# Patient Record
Sex: Male | Born: 1935
Health system: Southern US, Community
[De-identification: ages and names within clinical notes are randomized; demographics above are authoritative.]

## PROBLEM LIST (undated history)

## (undated) DIAGNOSIS — Z8739 Personal history of other diseases of the musculoskeletal system and connective tissue: Secondary | ICD-10-CM

## (undated) DIAGNOSIS — J449 Chronic obstructive pulmonary disease, unspecified: Secondary | ICD-10-CM

## (undated) DIAGNOSIS — F329 Major depressive disorder, single episode, unspecified: Secondary | ICD-10-CM

## (undated) DIAGNOSIS — Z951 Presence of aortocoronary bypass graft: Secondary | ICD-10-CM

## (undated) DIAGNOSIS — Z720 Tobacco use: Secondary | ICD-10-CM

## (undated) DIAGNOSIS — M1990 Unspecified osteoarthritis, unspecified site: Secondary | ICD-10-CM

## (undated) DIAGNOSIS — N400 Enlarged prostate without lower urinary tract symptoms: Secondary | ICD-10-CM

## (undated) DIAGNOSIS — K649 Unspecified hemorrhoids: Secondary | ICD-10-CM

## (undated) DIAGNOSIS — F32A Depression, unspecified: Secondary | ICD-10-CM

## (undated) DIAGNOSIS — I480 Paroxysmal atrial fibrillation: Secondary | ICD-10-CM

## (undated) DIAGNOSIS — F419 Anxiety disorder, unspecified: Secondary | ICD-10-CM

## (undated) DIAGNOSIS — E785 Hyperlipidemia, unspecified: Secondary | ICD-10-CM

## (undated) DIAGNOSIS — K219 Gastro-esophageal reflux disease without esophagitis: Secondary | ICD-10-CM

## (undated) DIAGNOSIS — I2510 Atherosclerotic heart disease of native coronary artery without angina pectoris: Secondary | ICD-10-CM

## (undated) DIAGNOSIS — K222 Esophageal obstruction: Secondary | ICD-10-CM

## (undated) DIAGNOSIS — M50021 Cervical disc disorder at C4-C5 level with myelopathy: Secondary | ICD-10-CM

## (undated) DIAGNOSIS — I252 Old myocardial infarction: Secondary | ICD-10-CM

## (undated) DIAGNOSIS — I10 Essential (primary) hypertension: Secondary | ICD-10-CM

## (undated) DIAGNOSIS — M24561 Contracture, right knee: Secondary | ICD-10-CM

## (undated) DIAGNOSIS — Z01818 Encounter for other preprocedural examination: Secondary | ICD-10-CM

## (undated) DIAGNOSIS — R269 Unspecified abnormalities of gait and mobility: Secondary | ICD-10-CM

## (undated) HISTORY — DX: Anxiety disorder, unspecified: F41.9

## (undated) HISTORY — PX: CARDIAC CATHETERIZATION: SHX172

## (undated) HISTORY — DX: Esophageal obstruction: K22.2

## (undated) HISTORY — DX: Paroxysmal atrial fibrillation: I48.0

## (undated) HISTORY — DX: Chronic obstructive pulmonary disease, unspecified: J44.9

## (undated) HISTORY — DX: Essential (primary) hypertension: I10

## (undated) HISTORY — DX: Atherosclerotic heart disease of native coronary artery without angina pectoris: I25.10

## (undated) HISTORY — DX: Unspecified hemorrhoids: K64.9

## (undated) HISTORY — DX: Gastro-esophageal reflux disease without esophagitis: K21.9

## (undated) HISTORY — DX: Depression, unspecified: F32.A

## (undated) HISTORY — DX: Personal history of other diseases of the musculoskeletal system and connective tissue: Z87.39

## (undated) HISTORY — DX: Unspecified abnormalities of gait and mobility: R26.9

## (undated) HISTORY — DX: Tobacco use: Z72.0

## (undated) HISTORY — DX: Presence of aortocoronary bypass graft: Z95.1

## (undated) HISTORY — DX: Encounter for other preprocedural examination: Z01.818

## (undated) HISTORY — DX: Old myocardial infarction: I25.2

## (undated) HISTORY — DX: Benign prostatic hyperplasia without lower urinary tract symptoms: N40.0

## (undated) HISTORY — DX: Cervical disc disorder at C4-C5 level with myelopathy: M50.021

## (undated) HISTORY — DX: Unspecified osteoarthritis, unspecified site: M19.90

## (undated) HISTORY — DX: Hyperlipidemia, unspecified: E78.5

## (undated) HISTORY — DX: Contracture, right knee: M24.561

## (undated) HISTORY — PX: CORONARY ARTERY BYPASS GRAFT: SHX141

---
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
## 1994-08-04 HISTORY — PX: HEMORRHOID SURGERY: SHX153

## 2005-11-06 HISTORY — PX: ESOPHAGOGASTRODUODENOSCOPY: SHX1529

## 2011-12-08 DIAGNOSIS — I2510 Atherosclerotic heart disease of native coronary artery without angina pectoris: Secondary | ICD-10-CM

## 2012-03-22 DIAGNOSIS — J449 Chronic obstructive pulmonary disease, unspecified: Secondary | ICD-10-CM | POA: Insufficient documentation

## 2012-03-22 DIAGNOSIS — I2510 Atherosclerotic heart disease of native coronary artery without angina pectoris: Secondary | ICD-10-CM

## 2012-03-22 DIAGNOSIS — Z720 Tobacco use: Secondary | ICD-10-CM | POA: Insufficient documentation

## 2012-03-22 DIAGNOSIS — K219 Gastro-esophageal reflux disease without esophagitis: Secondary | ICD-10-CM

## 2012-03-22 DIAGNOSIS — I252 Old myocardial infarction: Secondary | ICD-10-CM

## 2012-03-22 DIAGNOSIS — I10 Essential (primary) hypertension: Secondary | ICD-10-CM

## 2012-03-22 HISTORY — DX: Old myocardial infarction: I25.2

## 2012-03-22 HISTORY — DX: Gastro-esophageal reflux disease without esophagitis: K21.9

## 2012-03-22 HISTORY — DX: Chronic obstructive pulmonary disease, unspecified: J44.9

## 2012-03-22 HISTORY — DX: Tobacco use: Z72.0

## 2012-03-22 HISTORY — DX: Essential (primary) hypertension: I10

## 2012-03-22 HISTORY — DX: Atherosclerotic heart disease of native coronary artery without angina pectoris: I25.10

## 2012-03-25 DIAGNOSIS — Z951 Presence of aortocoronary bypass graft: Secondary | ICD-10-CM

## 2012-03-25 HISTORY — DX: Presence of aortocoronary bypass graft: Z95.1

## 2015-03-05 DIAGNOSIS — I10 Essential (primary) hypertension: Secondary | ICD-10-CM | POA: Diagnosis not present

## 2015-03-05 DIAGNOSIS — I4891 Unspecified atrial fibrillation: Secondary | ICD-10-CM | POA: Diagnosis not present

## 2015-03-05 DIAGNOSIS — M150 Primary generalized (osteo)arthritis: Secondary | ICD-10-CM | POA: Diagnosis not present

## 2015-03-26 DIAGNOSIS — I2510 Atherosclerotic heart disease of native coronary artery without angina pectoris: Secondary | ICD-10-CM | POA: Diagnosis not present

## 2015-03-26 DIAGNOSIS — E785 Hyperlipidemia, unspecified: Secondary | ICD-10-CM

## 2015-03-26 DIAGNOSIS — I480 Paroxysmal atrial fibrillation: Secondary | ICD-10-CM

## 2015-03-26 HISTORY — DX: Hyperlipidemia, unspecified: E78.5

## 2015-03-26 HISTORY — DX: Paroxysmal atrial fibrillation: I48.0

## 2015-04-10 DIAGNOSIS — M170 Bilateral primary osteoarthritis of knee: Secondary | ICD-10-CM | POA: Diagnosis not present

## 2015-04-10 DIAGNOSIS — M25562 Pain in left knee: Secondary | ICD-10-CM | POA: Diagnosis not present

## 2015-04-10 DIAGNOSIS — M25561 Pain in right knee: Secondary | ICD-10-CM | POA: Diagnosis not present

## 2015-04-10 DIAGNOSIS — S0181XA Laceration without foreign body of other part of head, initial encounter: Secondary | ICD-10-CM | POA: Diagnosis not present

## 2015-04-10 DIAGNOSIS — M25461 Effusion, right knee: Secondary | ICD-10-CM | POA: Diagnosis not present

## 2015-04-16 DIAGNOSIS — M1711 Unilateral primary osteoarthritis, right knee: Secondary | ICD-10-CM | POA: Diagnosis not present

## 2015-04-19 DIAGNOSIS — K265 Chronic or unspecified duodenal ulcer with perforation: Secondary | ICD-10-CM | POA: Diagnosis not present

## 2015-05-07 DIAGNOSIS — K5730 Diverticulosis of large intestine without perforation or abscess without bleeding: Secondary | ICD-10-CM | POA: Diagnosis not present

## 2015-05-07 DIAGNOSIS — K648 Other hemorrhoids: Secondary | ICD-10-CM | POA: Diagnosis not present

## 2015-05-07 DIAGNOSIS — K219 Gastro-esophageal reflux disease without esophagitis: Secondary | ICD-10-CM | POA: Diagnosis not present

## 2015-05-07 DIAGNOSIS — K649 Unspecified hemorrhoids: Secondary | ICD-10-CM | POA: Diagnosis not present

## 2015-05-07 DIAGNOSIS — I10 Essential (primary) hypertension: Secondary | ICD-10-CM | POA: Diagnosis not present

## 2015-05-07 DIAGNOSIS — I2510 Atherosclerotic heart disease of native coronary artery without angina pectoris: Secondary | ICD-10-CM | POA: Diagnosis not present

## 2015-05-07 DIAGNOSIS — J449 Chronic obstructive pulmonary disease, unspecified: Secondary | ICD-10-CM | POA: Diagnosis not present

## 2015-05-07 DIAGNOSIS — D120 Benign neoplasm of cecum: Secondary | ICD-10-CM | POA: Diagnosis not present

## 2015-05-07 DIAGNOSIS — M1990 Unspecified osteoarthritis, unspecified site: Secondary | ICD-10-CM | POA: Diagnosis not present

## 2015-05-07 DIAGNOSIS — K625 Hemorrhage of anus and rectum: Secondary | ICD-10-CM | POA: Diagnosis not present

## 2015-05-07 DIAGNOSIS — D122 Benign neoplasm of ascending colon: Secondary | ICD-10-CM | POA: Diagnosis not present

## 2015-05-07 HISTORY — PX: COLONOSCOPY: SHX174

## 2015-05-28 DIAGNOSIS — M1711 Unilateral primary osteoarthritis, right knee: Secondary | ICD-10-CM | POA: Diagnosis not present

## 2015-05-30 DIAGNOSIS — M25561 Pain in right knee: Secondary | ICD-10-CM | POA: Diagnosis not present

## 2015-05-30 DIAGNOSIS — R262 Difficulty in walking, not elsewhere classified: Secondary | ICD-10-CM | POA: Diagnosis not present

## 2015-05-30 DIAGNOSIS — M25562 Pain in left knee: Secondary | ICD-10-CM | POA: Diagnosis not present

## 2015-06-04 DIAGNOSIS — M25562 Pain in left knee: Secondary | ICD-10-CM | POA: Diagnosis not present

## 2015-06-04 DIAGNOSIS — R262 Difficulty in walking, not elsewhere classified: Secondary | ICD-10-CM | POA: Diagnosis not present

## 2015-06-04 DIAGNOSIS — M25561 Pain in right knee: Secondary | ICD-10-CM | POA: Diagnosis not present

## 2015-06-06 DIAGNOSIS — R262 Difficulty in walking, not elsewhere classified: Secondary | ICD-10-CM | POA: Diagnosis not present

## 2015-06-06 DIAGNOSIS — I2510 Atherosclerotic heart disease of native coronary artery without angina pectoris: Secondary | ICD-10-CM | POA: Diagnosis not present

## 2015-06-06 DIAGNOSIS — I10 Essential (primary) hypertension: Secondary | ICD-10-CM | POA: Diagnosis not present

## 2015-06-06 DIAGNOSIS — W19XXXD Unspecified fall, subsequent encounter: Secondary | ICD-10-CM | POA: Diagnosis not present

## 2015-06-06 DIAGNOSIS — Z23 Encounter for immunization: Secondary | ICD-10-CM | POA: Diagnosis not present

## 2015-06-06 DIAGNOSIS — I480 Paroxysmal atrial fibrillation: Secondary | ICD-10-CM | POA: Diagnosis not present

## 2015-06-06 DIAGNOSIS — E7800 Pure hypercholesterolemia, unspecified: Secondary | ICD-10-CM | POA: Diagnosis not present

## 2015-06-06 DIAGNOSIS — Z0000 Encounter for general adult medical examination without abnormal findings: Secondary | ICD-10-CM | POA: Diagnosis not present

## 2015-06-06 DIAGNOSIS — Z1389 Encounter for screening for other disorder: Secondary | ICD-10-CM | POA: Diagnosis not present

## 2015-06-06 DIAGNOSIS — K219 Gastro-esophageal reflux disease without esophagitis: Secondary | ICD-10-CM | POA: Diagnosis not present

## 2015-06-06 DIAGNOSIS — N400 Enlarged prostate without lower urinary tract symptoms: Secondary | ICD-10-CM | POA: Diagnosis not present

## 2015-06-07 DIAGNOSIS — M25561 Pain in right knee: Secondary | ICD-10-CM | POA: Diagnosis not present

## 2015-06-07 DIAGNOSIS — R262 Difficulty in walking, not elsewhere classified: Secondary | ICD-10-CM | POA: Diagnosis not present

## 2015-06-07 DIAGNOSIS — M25562 Pain in left knee: Secondary | ICD-10-CM | POA: Diagnosis not present

## 2015-06-12 DIAGNOSIS — R262 Difficulty in walking, not elsewhere classified: Secondary | ICD-10-CM | POA: Diagnosis not present

## 2015-06-12 DIAGNOSIS — M25561 Pain in right knee: Secondary | ICD-10-CM | POA: Diagnosis not present

## 2015-06-12 DIAGNOSIS — M25562 Pain in left knee: Secondary | ICD-10-CM | POA: Diagnosis not present

## 2015-06-18 DIAGNOSIS — M1711 Unilateral primary osteoarthritis, right knee: Secondary | ICD-10-CM | POA: Diagnosis not present

## 2015-06-19 DIAGNOSIS — R262 Difficulty in walking, not elsewhere classified: Secondary | ICD-10-CM | POA: Diagnosis not present

## 2015-06-19 DIAGNOSIS — M25561 Pain in right knee: Secondary | ICD-10-CM | POA: Diagnosis not present

## 2015-06-19 DIAGNOSIS — M25562 Pain in left knee: Secondary | ICD-10-CM | POA: Diagnosis not present

## 2015-06-21 DIAGNOSIS — R262 Difficulty in walking, not elsewhere classified: Secondary | ICD-10-CM | POA: Diagnosis not present

## 2015-06-21 DIAGNOSIS — M720 Palmar fascial fibromatosis [Dupuytren]: Secondary | ICD-10-CM | POA: Diagnosis not present

## 2015-06-21 DIAGNOSIS — M25562 Pain in left knee: Secondary | ICD-10-CM | POA: Diagnosis not present

## 2015-06-21 DIAGNOSIS — M25561 Pain in right knee: Secondary | ICD-10-CM | POA: Diagnosis not present

## 2015-06-24 DIAGNOSIS — M25552 Pain in left hip: Secondary | ICD-10-CM | POA: Diagnosis not present

## 2015-06-24 DIAGNOSIS — M1612 Unilateral primary osteoarthritis, left hip: Secondary | ICD-10-CM | POA: Diagnosis not present

## 2015-06-24 DIAGNOSIS — M1610 Unilateral primary osteoarthritis, unspecified hip: Secondary | ICD-10-CM | POA: Diagnosis not present

## 2015-06-27 DIAGNOSIS — I480 Paroxysmal atrial fibrillation: Secondary | ICD-10-CM | POA: Diagnosis not present

## 2015-06-27 DIAGNOSIS — I2510 Atherosclerotic heart disease of native coronary artery without angina pectoris: Secondary | ICD-10-CM | POA: Diagnosis not present

## 2015-06-27 DIAGNOSIS — E785 Hyperlipidemia, unspecified: Secondary | ICD-10-CM | POA: Diagnosis not present

## 2015-07-03 DIAGNOSIS — M25561 Pain in right knee: Secondary | ICD-10-CM | POA: Diagnosis not present

## 2015-07-03 DIAGNOSIS — M25562 Pain in left knee: Secondary | ICD-10-CM | POA: Diagnosis not present

## 2015-07-03 DIAGNOSIS — R262 Difficulty in walking, not elsewhere classified: Secondary | ICD-10-CM | POA: Diagnosis not present

## 2015-07-05 DIAGNOSIS — M25562 Pain in left knee: Secondary | ICD-10-CM | POA: Diagnosis not present

## 2015-07-05 DIAGNOSIS — M25561 Pain in right knee: Secondary | ICD-10-CM | POA: Diagnosis not present

## 2015-07-05 DIAGNOSIS — R262 Difficulty in walking, not elsewhere classified: Secondary | ICD-10-CM | POA: Diagnosis not present

## 2015-07-18 DIAGNOSIS — M720 Palmar fascial fibromatosis [Dupuytren]: Secondary | ICD-10-CM | POA: Diagnosis not present

## 2015-07-19 DIAGNOSIS — M720 Palmar fascial fibromatosis [Dupuytren]: Secondary | ICD-10-CM | POA: Diagnosis not present

## 2015-08-02 DIAGNOSIS — M720 Palmar fascial fibromatosis [Dupuytren]: Secondary | ICD-10-CM | POA: Diagnosis not present

## 2015-08-15 DIAGNOSIS — M720 Palmar fascial fibromatosis [Dupuytren]: Secondary | ICD-10-CM | POA: Diagnosis not present

## 2015-08-16 DIAGNOSIS — M720 Palmar fascial fibromatosis [Dupuytren]: Secondary | ICD-10-CM | POA: Diagnosis not present

## 2015-08-27 DIAGNOSIS — M720 Palmar fascial fibromatosis [Dupuytren]: Secondary | ICD-10-CM | POA: Diagnosis not present

## 2015-12-20 DIAGNOSIS — I480 Paroxysmal atrial fibrillation: Secondary | ICD-10-CM | POA: Diagnosis not present

## 2015-12-20 DIAGNOSIS — I2510 Atherosclerotic heart disease of native coronary artery without angina pectoris: Secondary | ICD-10-CM | POA: Diagnosis not present

## 2015-12-20 DIAGNOSIS — E785 Hyperlipidemia, unspecified: Secondary | ICD-10-CM | POA: Diagnosis not present

## 2015-12-26 DIAGNOSIS — E785 Hyperlipidemia, unspecified: Secondary | ICD-10-CM | POA: Diagnosis not present

## 2015-12-26 DIAGNOSIS — I480 Paroxysmal atrial fibrillation: Secondary | ICD-10-CM | POA: Diagnosis not present

## 2015-12-26 DIAGNOSIS — I2510 Atherosclerotic heart disease of native coronary artery without angina pectoris: Secondary | ICD-10-CM | POA: Diagnosis not present

## 2016-02-04 DIAGNOSIS — M170 Bilateral primary osteoarthritis of knee: Secondary | ICD-10-CM | POA: Diagnosis not present

## 2016-03-13 DIAGNOSIS — M170 Bilateral primary osteoarthritis of knee: Secondary | ICD-10-CM | POA: Diagnosis not present

## 2016-03-18 DIAGNOSIS — M25561 Pain in right knee: Secondary | ICD-10-CM | POA: Diagnosis not present

## 2016-03-20 DIAGNOSIS — M1711 Unilateral primary osteoarthritis, right knee: Secondary | ICD-10-CM | POA: Diagnosis not present

## 2016-03-25 DIAGNOSIS — Z01818 Encounter for other preprocedural examination: Secondary | ICD-10-CM

## 2016-03-25 DIAGNOSIS — M6281 Muscle weakness (generalized): Secondary | ICD-10-CM | POA: Diagnosis not present

## 2016-03-25 DIAGNOSIS — M25561 Pain in right knee: Secondary | ICD-10-CM | POA: Diagnosis not present

## 2016-03-25 DIAGNOSIS — I480 Paroxysmal atrial fibrillation: Secondary | ICD-10-CM | POA: Diagnosis not present

## 2016-03-25 DIAGNOSIS — M1711 Unilateral primary osteoarthritis, right knee: Secondary | ICD-10-CM | POA: Diagnosis not present

## 2016-03-25 DIAGNOSIS — R2689 Other abnormalities of gait and mobility: Secondary | ICD-10-CM | POA: Diagnosis not present

## 2016-03-25 DIAGNOSIS — E785 Hyperlipidemia, unspecified: Secondary | ICD-10-CM | POA: Diagnosis not present

## 2016-03-25 DIAGNOSIS — I2510 Atherosclerotic heart disease of native coronary artery without angina pectoris: Secondary | ICD-10-CM | POA: Diagnosis not present

## 2016-03-25 HISTORY — DX: Encounter for other preprocedural examination: Z01.818

## 2016-03-26 DIAGNOSIS — J984 Other disorders of lung: Secondary | ICD-10-CM | POA: Diagnosis not present

## 2016-03-26 DIAGNOSIS — M79609 Pain in unspecified limb: Secondary | ICD-10-CM | POA: Diagnosis not present

## 2016-03-26 DIAGNOSIS — E559 Vitamin D deficiency, unspecified: Secondary | ICD-10-CM | POA: Diagnosis not present

## 2016-03-26 DIAGNOSIS — Z01810 Encounter for preprocedural cardiovascular examination: Secondary | ICD-10-CM | POA: Diagnosis not present

## 2016-03-26 DIAGNOSIS — Z79899 Other long term (current) drug therapy: Secondary | ICD-10-CM | POA: Diagnosis not present

## 2016-03-26 DIAGNOSIS — Z01818 Encounter for other preprocedural examination: Secondary | ICD-10-CM | POA: Diagnosis not present

## 2016-03-26 DIAGNOSIS — Z01812 Encounter for preprocedural laboratory examination: Secondary | ICD-10-CM | POA: Diagnosis not present

## 2016-03-28 DIAGNOSIS — I4891 Unspecified atrial fibrillation: Secondary | ICD-10-CM | POA: Diagnosis not present

## 2016-03-28 DIAGNOSIS — I2510 Atherosclerotic heart disease of native coronary artery without angina pectoris: Secondary | ICD-10-CM | POA: Diagnosis not present

## 2016-03-28 DIAGNOSIS — M170 Bilateral primary osteoarthritis of knee: Secondary | ICD-10-CM | POA: Diagnosis not present

## 2016-03-31 DIAGNOSIS — M25561 Pain in right knee: Secondary | ICD-10-CM | POA: Diagnosis not present

## 2016-03-31 DIAGNOSIS — M6281 Muscle weakness (generalized): Secondary | ICD-10-CM | POA: Diagnosis not present

## 2016-03-31 DIAGNOSIS — M1711 Unilateral primary osteoarthritis, right knee: Secondary | ICD-10-CM | POA: Diagnosis not present

## 2016-03-31 DIAGNOSIS — R2689 Other abnormalities of gait and mobility: Secondary | ICD-10-CM | POA: Diagnosis not present

## 2016-04-02 DIAGNOSIS — M25561 Pain in right knee: Secondary | ICD-10-CM | POA: Diagnosis not present

## 2016-04-02 DIAGNOSIS — M6281 Muscle weakness (generalized): Secondary | ICD-10-CM | POA: Diagnosis not present

## 2016-04-02 DIAGNOSIS — R2689 Other abnormalities of gait and mobility: Secondary | ICD-10-CM | POA: Diagnosis not present

## 2016-04-02 DIAGNOSIS — M1711 Unilateral primary osteoarthritis, right knee: Secondary | ICD-10-CM | POA: Diagnosis not present

## 2016-04-03 DIAGNOSIS — I4891 Unspecified atrial fibrillation: Secondary | ICD-10-CM | POA: Diagnosis not present

## 2016-04-08 DIAGNOSIS — M25561 Pain in right knee: Secondary | ICD-10-CM | POA: Diagnosis not present

## 2016-04-08 DIAGNOSIS — M1711 Unilateral primary osteoarthritis, right knee: Secondary | ICD-10-CM | POA: Diagnosis not present

## 2016-04-08 DIAGNOSIS — M6281 Muscle weakness (generalized): Secondary | ICD-10-CM | POA: Diagnosis not present

## 2016-04-08 DIAGNOSIS — I480 Paroxysmal atrial fibrillation: Secondary | ICD-10-CM | POA: Diagnosis not present

## 2016-04-08 DIAGNOSIS — R2689 Other abnormalities of gait and mobility: Secondary | ICD-10-CM | POA: Diagnosis not present

## 2016-04-10 DIAGNOSIS — M25561 Pain in right knee: Secondary | ICD-10-CM | POA: Diagnosis not present

## 2016-04-10 DIAGNOSIS — R2689 Other abnormalities of gait and mobility: Secondary | ICD-10-CM | POA: Diagnosis not present

## 2016-04-10 DIAGNOSIS — M6281 Muscle weakness (generalized): Secondary | ICD-10-CM | POA: Diagnosis not present

## 2016-04-10 DIAGNOSIS — M1711 Unilateral primary osteoarthritis, right knee: Secondary | ICD-10-CM | POA: Diagnosis not present

## 2016-04-15 DIAGNOSIS — Z01818 Encounter for other preprocedural examination: Secondary | ICD-10-CM | POA: Diagnosis not present

## 2016-04-15 DIAGNOSIS — I2510 Atherosclerotic heart disease of native coronary artery without angina pectoris: Secondary | ICD-10-CM | POA: Diagnosis not present

## 2016-04-15 DIAGNOSIS — E785 Hyperlipidemia, unspecified: Secondary | ICD-10-CM | POA: Diagnosis not present

## 2016-04-15 DIAGNOSIS — I480 Paroxysmal atrial fibrillation: Secondary | ICD-10-CM | POA: Diagnosis not present

## 2016-04-16 DIAGNOSIS — M25561 Pain in right knee: Secondary | ICD-10-CM | POA: Diagnosis not present

## 2016-04-16 DIAGNOSIS — R2689 Other abnormalities of gait and mobility: Secondary | ICD-10-CM | POA: Diagnosis not present

## 2016-04-16 DIAGNOSIS — M6281 Muscle weakness (generalized): Secondary | ICD-10-CM | POA: Diagnosis not present

## 2016-04-16 DIAGNOSIS — M1711 Unilateral primary osteoarthritis, right knee: Secondary | ICD-10-CM | POA: Diagnosis not present

## 2016-04-18 DIAGNOSIS — M25561 Pain in right knee: Secondary | ICD-10-CM | POA: Diagnosis not present

## 2016-04-18 DIAGNOSIS — M6281 Muscle weakness (generalized): Secondary | ICD-10-CM | POA: Diagnosis not present

## 2016-04-18 DIAGNOSIS — R2689 Other abnormalities of gait and mobility: Secondary | ICD-10-CM | POA: Diagnosis not present

## 2016-04-18 DIAGNOSIS — M1711 Unilateral primary osteoarthritis, right knee: Secondary | ICD-10-CM | POA: Diagnosis not present

## 2016-04-22 DIAGNOSIS — M6281 Muscle weakness (generalized): Secondary | ICD-10-CM | POA: Diagnosis not present

## 2016-04-22 DIAGNOSIS — R2689 Other abnormalities of gait and mobility: Secondary | ICD-10-CM | POA: Diagnosis not present

## 2016-04-22 DIAGNOSIS — M25561 Pain in right knee: Secondary | ICD-10-CM | POA: Diagnosis not present

## 2016-04-22 DIAGNOSIS — M1711 Unilateral primary osteoarthritis, right knee: Secondary | ICD-10-CM | POA: Diagnosis not present

## 2016-04-25 DIAGNOSIS — M25561 Pain in right knee: Secondary | ICD-10-CM | POA: Diagnosis not present

## 2016-04-25 DIAGNOSIS — M6281 Muscle weakness (generalized): Secondary | ICD-10-CM | POA: Diagnosis not present

## 2016-04-25 DIAGNOSIS — R2689 Other abnormalities of gait and mobility: Secondary | ICD-10-CM | POA: Diagnosis not present

## 2016-04-25 DIAGNOSIS — M1711 Unilateral primary osteoarthritis, right knee: Secondary | ICD-10-CM | POA: Diagnosis not present

## 2016-04-29 DIAGNOSIS — M1711 Unilateral primary osteoarthritis, right knee: Secondary | ICD-10-CM | POA: Diagnosis not present

## 2016-04-29 DIAGNOSIS — M25561 Pain in right knee: Secondary | ICD-10-CM | POA: Diagnosis not present

## 2016-04-29 DIAGNOSIS — M6281 Muscle weakness (generalized): Secondary | ICD-10-CM | POA: Diagnosis not present

## 2016-04-29 DIAGNOSIS — R2689 Other abnormalities of gait and mobility: Secondary | ICD-10-CM | POA: Diagnosis not present

## 2016-04-30 DIAGNOSIS — M1711 Unilateral primary osteoarthritis, right knee: Secondary | ICD-10-CM | POA: Diagnosis not present

## 2016-04-30 DIAGNOSIS — Z01818 Encounter for other preprocedural examination: Secondary | ICD-10-CM | POA: Diagnosis not present

## 2016-05-01 DIAGNOSIS — M25561 Pain in right knee: Secondary | ICD-10-CM | POA: Diagnosis not present

## 2016-05-01 DIAGNOSIS — R2689 Other abnormalities of gait and mobility: Secondary | ICD-10-CM | POA: Diagnosis not present

## 2016-05-01 DIAGNOSIS — M6281 Muscle weakness (generalized): Secondary | ICD-10-CM | POA: Diagnosis not present

## 2016-05-01 DIAGNOSIS — M1711 Unilateral primary osteoarthritis, right knee: Secondary | ICD-10-CM | POA: Diagnosis not present

## 2016-05-06 DIAGNOSIS — M25561 Pain in right knee: Secondary | ICD-10-CM | POA: Diagnosis not present

## 2016-05-06 DIAGNOSIS — R2689 Other abnormalities of gait and mobility: Secondary | ICD-10-CM | POA: Diagnosis not present

## 2016-05-06 DIAGNOSIS — M1711 Unilateral primary osteoarthritis, right knee: Secondary | ICD-10-CM | POA: Diagnosis not present

## 2016-05-06 DIAGNOSIS — M6281 Muscle weakness (generalized): Secondary | ICD-10-CM | POA: Diagnosis not present

## 2016-05-08 DIAGNOSIS — M1711 Unilateral primary osteoarthritis, right knee: Secondary | ICD-10-CM | POA: Diagnosis not present

## 2016-05-08 DIAGNOSIS — R2689 Other abnormalities of gait and mobility: Secondary | ICD-10-CM | POA: Diagnosis not present

## 2016-05-08 DIAGNOSIS — M6281 Muscle weakness (generalized): Secondary | ICD-10-CM | POA: Diagnosis not present

## 2016-05-08 DIAGNOSIS — M25561 Pain in right knee: Secondary | ICD-10-CM | POA: Diagnosis not present

## 2016-05-13 DIAGNOSIS — J449 Chronic obstructive pulmonary disease, unspecified: Secondary | ICD-10-CM | POA: Diagnosis not present

## 2016-05-13 DIAGNOSIS — Z471 Aftercare following joint replacement surgery: Secondary | ICD-10-CM | POA: Diagnosis not present

## 2016-05-13 DIAGNOSIS — Z7401 Bed confinement status: Secondary | ICD-10-CM | POA: Diagnosis not present

## 2016-05-13 DIAGNOSIS — I441 Atrioventricular block, second degree: Secondary | ICD-10-CM | POA: Diagnosis not present

## 2016-05-13 DIAGNOSIS — Z79899 Other long term (current) drug therapy: Secondary | ICD-10-CM | POA: Diagnosis not present

## 2016-05-13 DIAGNOSIS — E7800 Pure hypercholesterolemia, unspecified: Secondary | ICD-10-CM | POA: Diagnosis not present

## 2016-05-13 DIAGNOSIS — K219 Gastro-esophageal reflux disease without esophagitis: Secondary | ICD-10-CM | POA: Diagnosis not present

## 2016-05-13 DIAGNOSIS — I4891 Unspecified atrial fibrillation: Secondary | ICD-10-CM | POA: Diagnosis not present

## 2016-05-13 DIAGNOSIS — Z888 Allergy status to other drugs, medicaments and biological substances status: Secondary | ICD-10-CM | POA: Diagnosis not present

## 2016-05-13 DIAGNOSIS — M1711 Unilateral primary osteoarthritis, right knee: Secondary | ICD-10-CM | POA: Diagnosis not present

## 2016-05-13 DIAGNOSIS — I25810 Atherosclerosis of coronary artery bypass graft(s) without angina pectoris: Secondary | ICD-10-CM | POA: Diagnosis not present

## 2016-05-13 DIAGNOSIS — R531 Weakness: Secondary | ICD-10-CM | POA: Diagnosis not present

## 2016-05-13 DIAGNOSIS — R9431 Abnormal electrocardiogram [ECG] [EKG]: Secondary | ICD-10-CM | POA: Diagnosis not present

## 2016-05-13 DIAGNOSIS — K5900 Constipation, unspecified: Secondary | ICD-10-CM | POA: Diagnosis not present

## 2016-05-13 DIAGNOSIS — I743 Embolism and thrombosis of arteries of the lower extremities: Secondary | ICD-10-CM | POA: Diagnosis not present

## 2016-05-13 DIAGNOSIS — Z96651 Presence of right artificial knee joint: Secondary | ICD-10-CM | POA: Diagnosis not present

## 2016-05-15 DIAGNOSIS — G8918 Other acute postprocedural pain: Secondary | ICD-10-CM | POA: Diagnosis not present

## 2016-05-15 DIAGNOSIS — D649 Anemia, unspecified: Secondary | ICD-10-CM | POA: Diagnosis not present

## 2016-05-15 DIAGNOSIS — Z96651 Presence of right artificial knee joint: Secondary | ICD-10-CM | POA: Diagnosis not present

## 2016-05-15 DIAGNOSIS — I743 Embolism and thrombosis of arteries of the lower extremities: Secondary | ICD-10-CM | POA: Diagnosis not present

## 2016-05-15 DIAGNOSIS — M1711 Unilateral primary osteoarthritis, right knee: Secondary | ICD-10-CM | POA: Diagnosis not present

## 2016-05-15 DIAGNOSIS — R262 Difficulty in walking, not elsewhere classified: Secondary | ICD-10-CM | POA: Diagnosis not present

## 2016-05-15 DIAGNOSIS — K5900 Constipation, unspecified: Secondary | ICD-10-CM | POA: Diagnosis not present

## 2016-05-15 DIAGNOSIS — Z7401 Bed confinement status: Secondary | ICD-10-CM | POA: Diagnosis not present

## 2016-05-15 DIAGNOSIS — Z471 Aftercare following joint replacement surgery: Secondary | ICD-10-CM | POA: Diagnosis not present

## 2016-05-15 DIAGNOSIS — R531 Weakness: Secondary | ICD-10-CM | POA: Diagnosis not present

## 2016-06-13 DIAGNOSIS — R2689 Other abnormalities of gait and mobility: Secondary | ICD-10-CM | POA: Diagnosis not present

## 2016-06-13 DIAGNOSIS — M25661 Stiffness of right knee, not elsewhere classified: Secondary | ICD-10-CM | POA: Diagnosis not present

## 2016-06-13 DIAGNOSIS — M25561 Pain in right knee: Secondary | ICD-10-CM | POA: Diagnosis not present

## 2016-06-13 DIAGNOSIS — M6281 Muscle weakness (generalized): Secondary | ICD-10-CM | POA: Diagnosis not present

## 2016-06-13 DIAGNOSIS — M1711 Unilateral primary osteoarthritis, right knee: Secondary | ICD-10-CM | POA: Diagnosis not present

## 2016-06-17 DIAGNOSIS — M25661 Stiffness of right knee, not elsewhere classified: Secondary | ICD-10-CM | POA: Diagnosis not present

## 2016-06-17 DIAGNOSIS — M6281 Muscle weakness (generalized): Secondary | ICD-10-CM | POA: Diagnosis not present

## 2016-06-17 DIAGNOSIS — M25561 Pain in right knee: Secondary | ICD-10-CM | POA: Diagnosis not present

## 2016-06-17 DIAGNOSIS — R2689 Other abnormalities of gait and mobility: Secondary | ICD-10-CM | POA: Diagnosis not present

## 2016-06-17 DIAGNOSIS — M1711 Unilateral primary osteoarthritis, right knee: Secondary | ICD-10-CM | POA: Diagnosis not present

## 2016-06-20 DIAGNOSIS — M1711 Unilateral primary osteoarthritis, right knee: Secondary | ICD-10-CM | POA: Diagnosis not present

## 2016-06-20 DIAGNOSIS — M6281 Muscle weakness (generalized): Secondary | ICD-10-CM | POA: Diagnosis not present

## 2016-06-20 DIAGNOSIS — M25661 Stiffness of right knee, not elsewhere classified: Secondary | ICD-10-CM | POA: Diagnosis not present

## 2016-06-20 DIAGNOSIS — M25561 Pain in right knee: Secondary | ICD-10-CM | POA: Diagnosis not present

## 2016-06-20 DIAGNOSIS — R2689 Other abnormalities of gait and mobility: Secondary | ICD-10-CM | POA: Diagnosis not present

## 2016-06-23 DIAGNOSIS — M1711 Unilateral primary osteoarthritis, right knee: Secondary | ICD-10-CM | POA: Diagnosis not present

## 2016-06-23 DIAGNOSIS — M25661 Stiffness of right knee, not elsewhere classified: Secondary | ICD-10-CM | POA: Diagnosis not present

## 2016-06-23 DIAGNOSIS — M25561 Pain in right knee: Secondary | ICD-10-CM | POA: Diagnosis not present

## 2016-06-23 DIAGNOSIS — R2689 Other abnormalities of gait and mobility: Secondary | ICD-10-CM | POA: Diagnosis not present

## 2016-06-23 DIAGNOSIS — M6281 Muscle weakness (generalized): Secondary | ICD-10-CM | POA: Diagnosis not present

## 2016-06-24 DIAGNOSIS — M1711 Unilateral primary osteoarthritis, right knee: Secondary | ICD-10-CM | POA: Diagnosis not present

## 2016-06-24 DIAGNOSIS — Z23 Encounter for immunization: Secondary | ICD-10-CM | POA: Diagnosis not present

## 2016-06-25 DIAGNOSIS — M1711 Unilateral primary osteoarthritis, right knee: Secondary | ICD-10-CM | POA: Diagnosis not present

## 2016-06-25 DIAGNOSIS — M25561 Pain in right knee: Secondary | ICD-10-CM | POA: Diagnosis not present

## 2016-06-25 DIAGNOSIS — R2689 Other abnormalities of gait and mobility: Secondary | ICD-10-CM | POA: Diagnosis not present

## 2016-06-25 DIAGNOSIS — M6281 Muscle weakness (generalized): Secondary | ICD-10-CM | POA: Diagnosis not present

## 2016-06-25 DIAGNOSIS — M25661 Stiffness of right knee, not elsewhere classified: Secondary | ICD-10-CM | POA: Diagnosis not present

## 2016-06-30 DIAGNOSIS — R2689 Other abnormalities of gait and mobility: Secondary | ICD-10-CM | POA: Diagnosis not present

## 2016-06-30 DIAGNOSIS — Z96651 Presence of right artificial knee joint: Secondary | ICD-10-CM | POA: Diagnosis not present

## 2016-06-30 DIAGNOSIS — M1711 Unilateral primary osteoarthritis, right knee: Secondary | ICD-10-CM | POA: Diagnosis not present

## 2016-06-30 DIAGNOSIS — M6281 Muscle weakness (generalized): Secondary | ICD-10-CM | POA: Diagnosis not present

## 2016-06-30 DIAGNOSIS — M25561 Pain in right knee: Secondary | ICD-10-CM | POA: Diagnosis not present

## 2016-06-30 DIAGNOSIS — M25661 Stiffness of right knee, not elsewhere classified: Secondary | ICD-10-CM | POA: Diagnosis not present

## 2016-07-03 DIAGNOSIS — M25661 Stiffness of right knee, not elsewhere classified: Secondary | ICD-10-CM | POA: Diagnosis not present

## 2016-07-03 DIAGNOSIS — R2689 Other abnormalities of gait and mobility: Secondary | ICD-10-CM | POA: Diagnosis not present

## 2016-07-03 DIAGNOSIS — M25561 Pain in right knee: Secondary | ICD-10-CM | POA: Diagnosis not present

## 2016-07-03 DIAGNOSIS — M1711 Unilateral primary osteoarthritis, right knee: Secondary | ICD-10-CM | POA: Diagnosis not present

## 2016-07-03 DIAGNOSIS — M6281 Muscle weakness (generalized): Secondary | ICD-10-CM | POA: Diagnosis not present

## 2016-07-08 DIAGNOSIS — M6281 Muscle weakness (generalized): Secondary | ICD-10-CM | POA: Diagnosis not present

## 2016-07-08 DIAGNOSIS — M25661 Stiffness of right knee, not elsewhere classified: Secondary | ICD-10-CM | POA: Diagnosis not present

## 2016-07-08 DIAGNOSIS — M1711 Unilateral primary osteoarthritis, right knee: Secondary | ICD-10-CM | POA: Diagnosis not present

## 2016-07-08 DIAGNOSIS — M25561 Pain in right knee: Secondary | ICD-10-CM | POA: Diagnosis not present

## 2016-07-08 DIAGNOSIS — R2689 Other abnormalities of gait and mobility: Secondary | ICD-10-CM | POA: Diagnosis not present

## 2016-07-10 DIAGNOSIS — R2689 Other abnormalities of gait and mobility: Secondary | ICD-10-CM | POA: Diagnosis not present

## 2016-07-10 DIAGNOSIS — M25661 Stiffness of right knee, not elsewhere classified: Secondary | ICD-10-CM | POA: Diagnosis not present

## 2016-07-10 DIAGNOSIS — M6281 Muscle weakness (generalized): Secondary | ICD-10-CM | POA: Diagnosis not present

## 2016-07-10 DIAGNOSIS — M25561 Pain in right knee: Secondary | ICD-10-CM | POA: Diagnosis not present

## 2016-07-10 DIAGNOSIS — M1711 Unilateral primary osteoarthritis, right knee: Secondary | ICD-10-CM | POA: Diagnosis not present

## 2016-07-14 DIAGNOSIS — M25661 Stiffness of right knee, not elsewhere classified: Secondary | ICD-10-CM | POA: Diagnosis not present

## 2016-07-14 DIAGNOSIS — M1711 Unilateral primary osteoarthritis, right knee: Secondary | ICD-10-CM | POA: Diagnosis not present

## 2016-07-14 DIAGNOSIS — M6281 Muscle weakness (generalized): Secondary | ICD-10-CM | POA: Diagnosis not present

## 2016-07-14 DIAGNOSIS — R2689 Other abnormalities of gait and mobility: Secondary | ICD-10-CM | POA: Diagnosis not present

## 2016-07-14 DIAGNOSIS — M25561 Pain in right knee: Secondary | ICD-10-CM | POA: Diagnosis not present

## 2016-07-17 DIAGNOSIS — M1711 Unilateral primary osteoarthritis, right knee: Secondary | ICD-10-CM | POA: Diagnosis not present

## 2016-07-17 DIAGNOSIS — M25561 Pain in right knee: Secondary | ICD-10-CM | POA: Diagnosis not present

## 2016-07-17 DIAGNOSIS — R2689 Other abnormalities of gait and mobility: Secondary | ICD-10-CM | POA: Diagnosis not present

## 2016-07-17 DIAGNOSIS — M25661 Stiffness of right knee, not elsewhere classified: Secondary | ICD-10-CM | POA: Diagnosis not present

## 2016-07-17 DIAGNOSIS — M6281 Muscle weakness (generalized): Secondary | ICD-10-CM | POA: Diagnosis not present

## 2016-07-22 DIAGNOSIS — M25661 Stiffness of right knee, not elsewhere classified: Secondary | ICD-10-CM | POA: Diagnosis not present

## 2016-07-22 DIAGNOSIS — R2689 Other abnormalities of gait and mobility: Secondary | ICD-10-CM | POA: Diagnosis not present

## 2016-07-22 DIAGNOSIS — M1711 Unilateral primary osteoarthritis, right knee: Secondary | ICD-10-CM | POA: Diagnosis not present

## 2016-07-22 DIAGNOSIS — M25561 Pain in right knee: Secondary | ICD-10-CM | POA: Diagnosis not present

## 2016-07-22 DIAGNOSIS — M6281 Muscle weakness (generalized): Secondary | ICD-10-CM | POA: Diagnosis not present

## 2016-07-24 DIAGNOSIS — M25661 Stiffness of right knee, not elsewhere classified: Secondary | ICD-10-CM | POA: Diagnosis not present

## 2016-07-24 DIAGNOSIS — M6281 Muscle weakness (generalized): Secondary | ICD-10-CM | POA: Diagnosis not present

## 2016-07-24 DIAGNOSIS — R2689 Other abnormalities of gait and mobility: Secondary | ICD-10-CM | POA: Diagnosis not present

## 2016-07-24 DIAGNOSIS — M25561 Pain in right knee: Secondary | ICD-10-CM | POA: Diagnosis not present

## 2016-07-24 DIAGNOSIS — M1711 Unilateral primary osteoarthritis, right knee: Secondary | ICD-10-CM | POA: Diagnosis not present

## 2016-08-01 DIAGNOSIS — M6281 Muscle weakness (generalized): Secondary | ICD-10-CM | POA: Diagnosis not present

## 2016-08-01 DIAGNOSIS — M1711 Unilateral primary osteoarthritis, right knee: Secondary | ICD-10-CM | POA: Diagnosis not present

## 2016-08-01 DIAGNOSIS — R2689 Other abnormalities of gait and mobility: Secondary | ICD-10-CM | POA: Diagnosis not present

## 2016-08-01 DIAGNOSIS — M25561 Pain in right knee: Secondary | ICD-10-CM | POA: Diagnosis not present

## 2016-08-01 DIAGNOSIS — M25661 Stiffness of right knee, not elsewhere classified: Secondary | ICD-10-CM | POA: Diagnosis not present

## 2016-08-06 DIAGNOSIS — M6281 Muscle weakness (generalized): Secondary | ICD-10-CM | POA: Diagnosis not present

## 2016-08-06 DIAGNOSIS — R2689 Other abnormalities of gait and mobility: Secondary | ICD-10-CM | POA: Diagnosis not present

## 2016-08-06 DIAGNOSIS — M25661 Stiffness of right knee, not elsewhere classified: Secondary | ICD-10-CM | POA: Diagnosis not present

## 2016-08-06 DIAGNOSIS — M1711 Unilateral primary osteoarthritis, right knee: Secondary | ICD-10-CM | POA: Diagnosis not present

## 2016-08-06 DIAGNOSIS — M25561 Pain in right knee: Secondary | ICD-10-CM | POA: Diagnosis not present

## 2016-08-08 DIAGNOSIS — M25561 Pain in right knee: Secondary | ICD-10-CM | POA: Diagnosis not present

## 2016-08-08 DIAGNOSIS — M25661 Stiffness of right knee, not elsewhere classified: Secondary | ICD-10-CM | POA: Diagnosis not present

## 2016-08-08 DIAGNOSIS — R2689 Other abnormalities of gait and mobility: Secondary | ICD-10-CM | POA: Diagnosis not present

## 2016-08-08 DIAGNOSIS — M1711 Unilateral primary osteoarthritis, right knee: Secondary | ICD-10-CM | POA: Diagnosis not present

## 2016-08-08 DIAGNOSIS — M6281 Muscle weakness (generalized): Secondary | ICD-10-CM | POA: Diagnosis not present

## 2016-08-12 DIAGNOSIS — M1711 Unilateral primary osteoarthritis, right knee: Secondary | ICD-10-CM | POA: Diagnosis not present

## 2016-08-12 DIAGNOSIS — Z01810 Encounter for preprocedural cardiovascular examination: Secondary | ICD-10-CM | POA: Diagnosis not present

## 2016-08-19 DIAGNOSIS — M1712 Unilateral primary osteoarthritis, left knee: Secondary | ICD-10-CM | POA: Diagnosis not present

## 2016-08-26 DIAGNOSIS — Z7982 Long term (current) use of aspirin: Secondary | ICD-10-CM | POA: Diagnosis not present

## 2016-08-26 DIAGNOSIS — I2510 Atherosclerotic heart disease of native coronary artery without angina pectoris: Secondary | ICD-10-CM | POA: Diagnosis not present

## 2016-08-26 DIAGNOSIS — I10 Essential (primary) hypertension: Secondary | ICD-10-CM | POA: Diagnosis not present

## 2016-08-26 DIAGNOSIS — M24661 Ankylosis, right knee: Secondary | ICD-10-CM | POA: Diagnosis not present

## 2016-08-26 DIAGNOSIS — G8918 Other acute postprocedural pain: Secondary | ICD-10-CM | POA: Diagnosis not present

## 2016-08-26 DIAGNOSIS — M25661 Stiffness of right knee, not elsewhere classified: Secondary | ICD-10-CM | POA: Diagnosis not present

## 2016-08-26 DIAGNOSIS — Z79899 Other long term (current) drug therapy: Secondary | ICD-10-CM | POA: Diagnosis not present

## 2016-08-26 DIAGNOSIS — Z96651 Presence of right artificial knee joint: Secondary | ICD-10-CM | POA: Diagnosis not present

## 2016-08-26 DIAGNOSIS — I4891 Unspecified atrial fibrillation: Secondary | ICD-10-CM | POA: Diagnosis not present

## 2016-08-26 DIAGNOSIS — E7800 Pure hypercholesterolemia, unspecified: Secondary | ICD-10-CM | POA: Diagnosis not present

## 2016-08-26 DIAGNOSIS — K219 Gastro-esophageal reflux disease without esophagitis: Secondary | ICD-10-CM | POA: Diagnosis not present

## 2016-08-26 DIAGNOSIS — J449 Chronic obstructive pulmonary disease, unspecified: Secondary | ICD-10-CM | POA: Diagnosis not present

## 2016-08-27 DIAGNOSIS — K219 Gastro-esophageal reflux disease without esophagitis: Secondary | ICD-10-CM | POA: Diagnosis not present

## 2016-08-27 DIAGNOSIS — Z7982 Long term (current) use of aspirin: Secondary | ICD-10-CM | POA: Diagnosis not present

## 2016-08-27 DIAGNOSIS — M25661 Stiffness of right knee, not elsewhere classified: Secondary | ICD-10-CM | POA: Diagnosis not present

## 2016-08-27 DIAGNOSIS — Z79899 Other long term (current) drug therapy: Secondary | ICD-10-CM | POA: Diagnosis not present

## 2016-08-27 DIAGNOSIS — Z96651 Presence of right artificial knee joint: Secondary | ICD-10-CM | POA: Diagnosis not present

## 2016-08-27 DIAGNOSIS — I2510 Atherosclerotic heart disease of native coronary artery without angina pectoris: Secondary | ICD-10-CM | POA: Diagnosis not present

## 2016-08-27 DIAGNOSIS — E7800 Pure hypercholesterolemia, unspecified: Secondary | ICD-10-CM | POA: Diagnosis not present

## 2016-08-27 DIAGNOSIS — J449 Chronic obstructive pulmonary disease, unspecified: Secondary | ICD-10-CM | POA: Diagnosis not present

## 2016-08-27 DIAGNOSIS — I4891 Unspecified atrial fibrillation: Secondary | ICD-10-CM | POA: Diagnosis not present

## 2016-08-28 DIAGNOSIS — T84092D Other mechanical complication of internal right knee prosthesis, subsequent encounter: Secondary | ICD-10-CM | POA: Diagnosis not present

## 2016-08-28 DIAGNOSIS — J449 Chronic obstructive pulmonary disease, unspecified: Secondary | ICD-10-CM | POA: Diagnosis not present

## 2016-08-28 DIAGNOSIS — I10 Essential (primary) hypertension: Secondary | ICD-10-CM | POA: Diagnosis not present

## 2016-08-28 DIAGNOSIS — Z87891 Personal history of nicotine dependence: Secondary | ICD-10-CM | POA: Diagnosis not present

## 2016-08-28 DIAGNOSIS — Z7982 Long term (current) use of aspirin: Secondary | ICD-10-CM | POA: Diagnosis not present

## 2016-08-28 DIAGNOSIS — I2510 Atherosclerotic heart disease of native coronary artery without angina pectoris: Secondary | ICD-10-CM | POA: Diagnosis not present

## 2016-08-29 DIAGNOSIS — J449 Chronic obstructive pulmonary disease, unspecified: Secondary | ICD-10-CM | POA: Diagnosis not present

## 2016-08-29 DIAGNOSIS — Z87891 Personal history of nicotine dependence: Secondary | ICD-10-CM | POA: Diagnosis not present

## 2016-08-29 DIAGNOSIS — I10 Essential (primary) hypertension: Secondary | ICD-10-CM | POA: Diagnosis not present

## 2016-08-29 DIAGNOSIS — Z7982 Long term (current) use of aspirin: Secondary | ICD-10-CM | POA: Diagnosis not present

## 2016-08-29 DIAGNOSIS — I2510 Atherosclerotic heart disease of native coronary artery without angina pectoris: Secondary | ICD-10-CM | POA: Diagnosis not present

## 2016-08-29 DIAGNOSIS — T84092D Other mechanical complication of internal right knee prosthesis, subsequent encounter: Secondary | ICD-10-CM | POA: Diagnosis not present

## 2016-09-01 DIAGNOSIS — T84092D Other mechanical complication of internal right knee prosthesis, subsequent encounter: Secondary | ICD-10-CM | POA: Diagnosis not present

## 2016-09-01 DIAGNOSIS — M25661 Stiffness of right knee, not elsewhere classified: Secondary | ICD-10-CM | POA: Diagnosis not present

## 2016-09-01 DIAGNOSIS — I10 Essential (primary) hypertension: Secondary | ICD-10-CM | POA: Diagnosis not present

## 2016-09-01 DIAGNOSIS — R2689 Other abnormalities of gait and mobility: Secondary | ICD-10-CM | POA: Diagnosis not present

## 2016-09-01 DIAGNOSIS — Z7982 Long term (current) use of aspirin: Secondary | ICD-10-CM | POA: Diagnosis not present

## 2016-09-01 DIAGNOSIS — M6281 Muscle weakness (generalized): Secondary | ICD-10-CM | POA: Diagnosis not present

## 2016-09-01 DIAGNOSIS — I2510 Atherosclerotic heart disease of native coronary artery without angina pectoris: Secondary | ICD-10-CM | POA: Diagnosis not present

## 2016-09-01 DIAGNOSIS — Z87891 Personal history of nicotine dependence: Secondary | ICD-10-CM | POA: Diagnosis not present

## 2016-09-01 DIAGNOSIS — J449 Chronic obstructive pulmonary disease, unspecified: Secondary | ICD-10-CM | POA: Diagnosis not present

## 2016-09-02 DIAGNOSIS — J449 Chronic obstructive pulmonary disease, unspecified: Secondary | ICD-10-CM | POA: Diagnosis not present

## 2016-09-02 DIAGNOSIS — Z87891 Personal history of nicotine dependence: Secondary | ICD-10-CM | POA: Diagnosis not present

## 2016-09-02 DIAGNOSIS — Z7982 Long term (current) use of aspirin: Secondary | ICD-10-CM | POA: Diagnosis not present

## 2016-09-02 DIAGNOSIS — I10 Essential (primary) hypertension: Secondary | ICD-10-CM | POA: Diagnosis not present

## 2016-09-02 DIAGNOSIS — I2510 Atherosclerotic heart disease of native coronary artery without angina pectoris: Secondary | ICD-10-CM | POA: Diagnosis not present

## 2016-09-02 DIAGNOSIS — T84092D Other mechanical complication of internal right knee prosthesis, subsequent encounter: Secondary | ICD-10-CM | POA: Diagnosis not present

## 2016-09-04 DIAGNOSIS — J449 Chronic obstructive pulmonary disease, unspecified: Secondary | ICD-10-CM | POA: Diagnosis not present

## 2016-09-04 DIAGNOSIS — I10 Essential (primary) hypertension: Secondary | ICD-10-CM | POA: Diagnosis not present

## 2016-09-04 DIAGNOSIS — I2510 Atherosclerotic heart disease of native coronary artery without angina pectoris: Secondary | ICD-10-CM | POA: Diagnosis not present

## 2016-09-04 DIAGNOSIS — Z7982 Long term (current) use of aspirin: Secondary | ICD-10-CM | POA: Diagnosis not present

## 2016-09-04 DIAGNOSIS — T84092D Other mechanical complication of internal right knee prosthesis, subsequent encounter: Secondary | ICD-10-CM | POA: Diagnosis not present

## 2016-09-04 DIAGNOSIS — Z87891 Personal history of nicotine dependence: Secondary | ICD-10-CM | POA: Diagnosis not present

## 2016-09-05 DIAGNOSIS — J449 Chronic obstructive pulmonary disease, unspecified: Secondary | ICD-10-CM | POA: Diagnosis not present

## 2016-09-05 DIAGNOSIS — Z7982 Long term (current) use of aspirin: Secondary | ICD-10-CM | POA: Diagnosis not present

## 2016-09-05 DIAGNOSIS — Z87891 Personal history of nicotine dependence: Secondary | ICD-10-CM | POA: Diagnosis not present

## 2016-09-05 DIAGNOSIS — I2510 Atherosclerotic heart disease of native coronary artery without angina pectoris: Secondary | ICD-10-CM | POA: Diagnosis not present

## 2016-09-05 DIAGNOSIS — I10 Essential (primary) hypertension: Secondary | ICD-10-CM | POA: Diagnosis not present

## 2016-09-05 DIAGNOSIS — T84092D Other mechanical complication of internal right knee prosthesis, subsequent encounter: Secondary | ICD-10-CM | POA: Diagnosis not present

## 2016-09-08 DIAGNOSIS — I10 Essential (primary) hypertension: Secondary | ICD-10-CM | POA: Diagnosis not present

## 2016-09-08 DIAGNOSIS — I2510 Atherosclerotic heart disease of native coronary artery without angina pectoris: Secondary | ICD-10-CM | POA: Diagnosis not present

## 2016-09-08 DIAGNOSIS — Z87891 Personal history of nicotine dependence: Secondary | ICD-10-CM | POA: Diagnosis not present

## 2016-09-08 DIAGNOSIS — T84092D Other mechanical complication of internal right knee prosthesis, subsequent encounter: Secondary | ICD-10-CM | POA: Diagnosis not present

## 2016-09-08 DIAGNOSIS — Z7982 Long term (current) use of aspirin: Secondary | ICD-10-CM | POA: Diagnosis not present

## 2016-09-08 DIAGNOSIS — J449 Chronic obstructive pulmonary disease, unspecified: Secondary | ICD-10-CM | POA: Diagnosis not present

## 2016-09-10 DIAGNOSIS — Z87891 Personal history of nicotine dependence: Secondary | ICD-10-CM | POA: Diagnosis not present

## 2016-09-10 DIAGNOSIS — I2510 Atherosclerotic heart disease of native coronary artery without angina pectoris: Secondary | ICD-10-CM | POA: Diagnosis not present

## 2016-09-10 DIAGNOSIS — Z7982 Long term (current) use of aspirin: Secondary | ICD-10-CM | POA: Diagnosis not present

## 2016-09-10 DIAGNOSIS — J449 Chronic obstructive pulmonary disease, unspecified: Secondary | ICD-10-CM | POA: Diagnosis not present

## 2016-09-10 DIAGNOSIS — T84092D Other mechanical complication of internal right knee prosthesis, subsequent encounter: Secondary | ICD-10-CM | POA: Diagnosis not present

## 2016-09-10 DIAGNOSIS — I10 Essential (primary) hypertension: Secondary | ICD-10-CM | POA: Diagnosis not present

## 2016-09-12 DIAGNOSIS — I10 Essential (primary) hypertension: Secondary | ICD-10-CM | POA: Diagnosis not present

## 2016-09-12 DIAGNOSIS — Z7982 Long term (current) use of aspirin: Secondary | ICD-10-CM | POA: Diagnosis not present

## 2016-09-12 DIAGNOSIS — M25661 Stiffness of right knee, not elsewhere classified: Secondary | ICD-10-CM | POA: Diagnosis not present

## 2016-09-12 DIAGNOSIS — Z96651 Presence of right artificial knee joint: Secondary | ICD-10-CM | POA: Diagnosis not present

## 2016-09-12 DIAGNOSIS — Z87891 Personal history of nicotine dependence: Secondary | ICD-10-CM | POA: Diagnosis not present

## 2016-09-12 DIAGNOSIS — I2510 Atherosclerotic heart disease of native coronary artery without angina pectoris: Secondary | ICD-10-CM | POA: Diagnosis not present

## 2016-09-12 DIAGNOSIS — T84092D Other mechanical complication of internal right knee prosthesis, subsequent encounter: Secondary | ICD-10-CM | POA: Diagnosis not present

## 2016-09-12 DIAGNOSIS — J449 Chronic obstructive pulmonary disease, unspecified: Secondary | ICD-10-CM | POA: Diagnosis not present

## 2016-09-15 DIAGNOSIS — I10 Essential (primary) hypertension: Secondary | ICD-10-CM | POA: Diagnosis not present

## 2016-09-15 DIAGNOSIS — T84092D Other mechanical complication of internal right knee prosthesis, subsequent encounter: Secondary | ICD-10-CM | POA: Diagnosis not present

## 2016-09-15 DIAGNOSIS — Z7982 Long term (current) use of aspirin: Secondary | ICD-10-CM | POA: Diagnosis not present

## 2016-09-15 DIAGNOSIS — Z87891 Personal history of nicotine dependence: Secondary | ICD-10-CM | POA: Diagnosis not present

## 2016-09-15 DIAGNOSIS — I2510 Atherosclerotic heart disease of native coronary artery without angina pectoris: Secondary | ICD-10-CM | POA: Diagnosis not present

## 2016-09-15 DIAGNOSIS — J449 Chronic obstructive pulmonary disease, unspecified: Secondary | ICD-10-CM | POA: Diagnosis not present

## 2016-09-17 DIAGNOSIS — Z87891 Personal history of nicotine dependence: Secondary | ICD-10-CM | POA: Diagnosis not present

## 2016-09-17 DIAGNOSIS — T84092D Other mechanical complication of internal right knee prosthesis, subsequent encounter: Secondary | ICD-10-CM | POA: Diagnosis not present

## 2016-09-17 DIAGNOSIS — I10 Essential (primary) hypertension: Secondary | ICD-10-CM | POA: Diagnosis not present

## 2016-09-17 DIAGNOSIS — I2510 Atherosclerotic heart disease of native coronary artery without angina pectoris: Secondary | ICD-10-CM | POA: Diagnosis not present

## 2016-09-17 DIAGNOSIS — J449 Chronic obstructive pulmonary disease, unspecified: Secondary | ICD-10-CM | POA: Diagnosis not present

## 2016-09-17 DIAGNOSIS — Z7982 Long term (current) use of aspirin: Secondary | ICD-10-CM | POA: Diagnosis not present

## 2016-09-19 DIAGNOSIS — I2510 Atherosclerotic heart disease of native coronary artery without angina pectoris: Secondary | ICD-10-CM | POA: Diagnosis not present

## 2016-09-19 DIAGNOSIS — Z7982 Long term (current) use of aspirin: Secondary | ICD-10-CM | POA: Diagnosis not present

## 2016-09-19 DIAGNOSIS — I10 Essential (primary) hypertension: Secondary | ICD-10-CM | POA: Diagnosis not present

## 2016-09-19 DIAGNOSIS — T84092D Other mechanical complication of internal right knee prosthesis, subsequent encounter: Secondary | ICD-10-CM | POA: Diagnosis not present

## 2016-09-19 DIAGNOSIS — J449 Chronic obstructive pulmonary disease, unspecified: Secondary | ICD-10-CM | POA: Diagnosis not present

## 2016-09-19 DIAGNOSIS — Z87891 Personal history of nicotine dependence: Secondary | ICD-10-CM | POA: Diagnosis not present

## 2016-09-25 DIAGNOSIS — M25561 Pain in right knee: Secondary | ICD-10-CM | POA: Diagnosis not present

## 2016-09-25 DIAGNOSIS — M25661 Stiffness of right knee, not elsewhere classified: Secondary | ICD-10-CM | POA: Diagnosis not present

## 2016-09-25 DIAGNOSIS — R2689 Other abnormalities of gait and mobility: Secondary | ICD-10-CM | POA: Diagnosis not present

## 2016-09-25 DIAGNOSIS — M6281 Muscle weakness (generalized): Secondary | ICD-10-CM | POA: Diagnosis not present

## 2016-09-29 DIAGNOSIS — M25561 Pain in right knee: Secondary | ICD-10-CM | POA: Diagnosis not present

## 2016-09-29 DIAGNOSIS — M25661 Stiffness of right knee, not elsewhere classified: Secondary | ICD-10-CM | POA: Diagnosis not present

## 2016-09-29 DIAGNOSIS — R2689 Other abnormalities of gait and mobility: Secondary | ICD-10-CM | POA: Diagnosis not present

## 2016-09-29 DIAGNOSIS — M6281 Muscle weakness (generalized): Secondary | ICD-10-CM | POA: Diagnosis not present

## 2016-10-03 DIAGNOSIS — R2689 Other abnormalities of gait and mobility: Secondary | ICD-10-CM | POA: Diagnosis not present

## 2016-10-03 DIAGNOSIS — M25561 Pain in right knee: Secondary | ICD-10-CM | POA: Diagnosis not present

## 2016-10-03 DIAGNOSIS — M25661 Stiffness of right knee, not elsewhere classified: Secondary | ICD-10-CM | POA: Diagnosis not present

## 2016-10-03 DIAGNOSIS — M6281 Muscle weakness (generalized): Secondary | ICD-10-CM | POA: Diagnosis not present

## 2016-10-06 DIAGNOSIS — R2689 Other abnormalities of gait and mobility: Secondary | ICD-10-CM | POA: Diagnosis not present

## 2016-10-06 DIAGNOSIS — M6281 Muscle weakness (generalized): Secondary | ICD-10-CM | POA: Diagnosis not present

## 2016-10-06 DIAGNOSIS — M25561 Pain in right knee: Secondary | ICD-10-CM | POA: Diagnosis not present

## 2016-10-06 DIAGNOSIS — M25661 Stiffness of right knee, not elsewhere classified: Secondary | ICD-10-CM | POA: Diagnosis not present

## 2016-10-10 DIAGNOSIS — M25561 Pain in right knee: Secondary | ICD-10-CM | POA: Diagnosis not present

## 2016-10-10 DIAGNOSIS — M6281 Muscle weakness (generalized): Secondary | ICD-10-CM | POA: Diagnosis not present

## 2016-10-10 DIAGNOSIS — M25661 Stiffness of right knee, not elsewhere classified: Secondary | ICD-10-CM | POA: Diagnosis not present

## 2016-10-10 DIAGNOSIS — R2689 Other abnormalities of gait and mobility: Secondary | ICD-10-CM | POA: Diagnosis not present

## 2016-10-15 DIAGNOSIS — M6281 Muscle weakness (generalized): Secondary | ICD-10-CM | POA: Diagnosis not present

## 2016-10-15 DIAGNOSIS — M25561 Pain in right knee: Secondary | ICD-10-CM | POA: Diagnosis not present

## 2016-10-15 DIAGNOSIS — R2689 Other abnormalities of gait and mobility: Secondary | ICD-10-CM | POA: Diagnosis not present

## 2016-10-15 DIAGNOSIS — M25661 Stiffness of right knee, not elsewhere classified: Secondary | ICD-10-CM | POA: Diagnosis not present

## 2016-10-17 ENCOUNTER — Encounter: Payer: Self-pay | Admitting: Physical Medicine & Rehabilitation

## 2016-10-17 ENCOUNTER — Telehealth: Payer: Self-pay | Admitting: *Deleted

## 2016-10-17 NOTE — Telephone Encounter (Signed)
Cameron Willis contacted patient and made appointment for patient to be seen by Dr. Letta Pate

## 2016-10-17 NOTE — Telephone Encounter (Signed)
Caller left a message asking [place] to call him back.  No details given. Presumably regarding his referral to our clinic for Botox which currently shows 'pending review'

## 2016-10-20 DIAGNOSIS — M6281 Muscle weakness (generalized): Secondary | ICD-10-CM | POA: Diagnosis not present

## 2016-10-20 DIAGNOSIS — M25561 Pain in right knee: Secondary | ICD-10-CM | POA: Diagnosis not present

## 2016-10-20 DIAGNOSIS — R2689 Other abnormalities of gait and mobility: Secondary | ICD-10-CM | POA: Diagnosis not present

## 2016-10-20 DIAGNOSIS — M25661 Stiffness of right knee, not elsewhere classified: Secondary | ICD-10-CM | POA: Diagnosis not present

## 2016-10-27 DIAGNOSIS — M25661 Stiffness of right knee, not elsewhere classified: Secondary | ICD-10-CM | POA: Diagnosis not present

## 2016-10-27 DIAGNOSIS — R2689 Other abnormalities of gait and mobility: Secondary | ICD-10-CM | POA: Diagnosis not present

## 2016-10-27 DIAGNOSIS — M6281 Muscle weakness (generalized): Secondary | ICD-10-CM | POA: Diagnosis not present

## 2016-10-27 DIAGNOSIS — M25561 Pain in right knee: Secondary | ICD-10-CM | POA: Diagnosis not present

## 2016-11-07 ENCOUNTER — Encounter: Payer: Self-pay | Admitting: Physical Medicine & Rehabilitation

## 2016-11-07 ENCOUNTER — Ambulatory Visit (HOSPITAL_BASED_OUTPATIENT_CLINIC_OR_DEPARTMENT_OTHER): Payer: Medicare HMO | Admitting: Physical Medicine & Rehabilitation

## 2016-11-07 ENCOUNTER — Encounter: Payer: Medicare HMO | Attending: Physical Medicine & Rehabilitation

## 2016-11-07 VITALS — BP 151/91 | HR 47 | Resp 14

## 2016-11-07 DIAGNOSIS — Z9889 Other specified postprocedural states: Secondary | ICD-10-CM | POA: Insufficient documentation

## 2016-11-07 DIAGNOSIS — R269 Unspecified abnormalities of gait and mobility: Secondary | ICD-10-CM

## 2016-11-07 DIAGNOSIS — I2510 Atherosclerotic heart disease of native coronary artery without angina pectoris: Secondary | ICD-10-CM | POA: Diagnosis not present

## 2016-11-07 DIAGNOSIS — Z96659 Presence of unspecified artificial knee joint: Secondary | ICD-10-CM | POA: Insufficient documentation

## 2016-11-07 DIAGNOSIS — M542 Cervicalgia: Secondary | ICD-10-CM | POA: Diagnosis not present

## 2016-11-07 DIAGNOSIS — M24561 Contracture, right knee: Secondary | ICD-10-CM | POA: Diagnosis not present

## 2016-11-07 DIAGNOSIS — Z87891 Personal history of nicotine dependence: Secondary | ICD-10-CM | POA: Insufficient documentation

## 2016-11-07 DIAGNOSIS — I4891 Unspecified atrial fibrillation: Secondary | ICD-10-CM | POA: Diagnosis not present

## 2016-11-07 NOTE — Patient Instructions (Signed)
Before we inject Botox, we need to find out what is causing your weakness and spasticity  If cervical MRI is negative , may need to do thoracic

## 2016-11-07 NOTE — Progress Notes (Signed)
Subjective:    Patient ID: Cameron Willis, male    DOB: 1933/12/26, 83 y.o.   MRN: [id number]  HPI Consult requested to evaluate right spastic hamstring  83 year old male with Afib and CAD who was referred by his orthopedic surgeon after total knee replacement in October 2017. Postoperatively, he has had problems with extending his knee. Has been evaluated by orthopedic surgery. No hardware failure, patient has not ambulated since surgery. He is able to stand with a walker but unable to ambulate with a walker. He is undergoing physical therapy. Denies any right arm weakness Period no past history of stroke. No past history of spinal cord problems. Past history of head injury, brain surgery No past history of neck surgery. He does have some neck pain.  Left side of body, has no abnormalities noted by patient.  No new bowel or bladder abnormalities   No memory or speech problem since TKR Some vertigo over the last 3 weeks  Dresses and bathes independantly Slid to floor a couple nites ago without injury Pain Inventory Average Pain 0 Pain Right Now 0 My pain is no pain  In the last 24 hours, has pain interfered with the following? General activity 0 Relation with others 0 Enjoyment of life 0 What TIME of day is your pain at its worst? no pain Sleep (in general) Good  Pain is worse with: no pain Pain improves with: no pain Relief from Meds: no pain  Mobility how many minutes can you walk? 0 ability to climb steps?  no do you drive?  no use a wheelchair transfers alone Do you have any goals in this area?  yes  Function retired  Neuro/Psych trouble walking dizziness  Prior Studies new visit  Physicians involved in your care new visit   History reviewed. No pertinent family history. Social History   Social History  . Marital status: Married    Spouse name: N/A  . Number of children: N/A  . Years of education: N/A   Social History Main Topics  . Smoking  status: Former Research scientist (life sciences)  . Smokeless tobacco: Never Used  . Alcohol use None  . Drug use: Unknown  . Sexual activity: Not Asked   Other Topics Concern  . None   Social History Narrative  . None   History reviewed. No pertinent surgical history. History reviewed. No pertinent past medical history. BP (!) 151/91 (BP Location: Right Arm, Patient Position: Sitting, Cuff Size: Normal)   Pulse (!) 47   Resp 14   SpO2 93%   Opioid Risk Score:   Fall Risk Score:  `1  Depression screen PHQ 2/9  No flowsheet data found.  Review of Systems  Constitutional: Positive for appetite change.  HENT: Negative.   Eyes: Negative.   Respiratory: Negative.   Cardiovascular: Negative.   Gastrointestinal: Positive for constipation.  Endocrine: Negative.   Genitourinary: Negative.   Musculoskeletal: Positive for gait problem.  Skin: Negative.   Allergic/Immunologic: Negative.   Neurological: Positive for dizziness.  Hematological: Negative.   Psychiatric/Behavioral: Negative.   All other systems reviewed and are negative.      Objective:   Physical Exam  Constitutional: He appears well-developed and well-nourished.  HENT:  Head: Normocephalic and atraumatic.  Right Ear: External ear normal.  Left Ear: External ear normal.  Eyes: Conjunctivae and EOM are normal. Pupils are equal, round, and reactive to light.  Neck: No JVD present.  Cardiovascular: Normal rate, S1 normal, S2 normal and normal heart sounds.  An irregularly irregular rhythm present. Exam reveals no gallop.   Nursing note and vitals reviewed.   Cervical range of motion 25% flexion, extension, lateral bending and rotation accompanied by pain. There is no tenderness along the thoracic spine. There is mild tenderness along the lumbar paraspinal muscles. Motor strength is 5/5 bilateral deltoid, bicep, tricep, grip is limited by finger range of motion. Lower extremity strength 4 minus. Hip flexion 3 minus right knee  extension 4 minus. Left knee extension 4 minus bilateral ankle dorsiflexion.       Assessment & Plan:  1. Right hamstring contracture versus spasticity. He does have upper and lower limb, increased reflexes, as well as neck pain. I suspect that he may have cervical stenosis and may have had some myelopathy in the perioperative time. Recommend cervical MRI. Need to establish a potential cause of spasticity. Prior to injecting Botox. We'll review studies. May need needle EMG to look for muscle activity as well. Can perform this at next appointment.

## 2016-11-17 DIAGNOSIS — Z96651 Presence of right artificial knee joint: Secondary | ICD-10-CM | POA: Diagnosis not present

## 2016-12-03 ENCOUNTER — Ambulatory Visit (HOSPITAL_COMMUNITY)
Admission: RE | Admit: 2016-12-03 | Discharge: 2016-12-03 | Disposition: A | Discharge status: Home / Self Care | Payer: Medicare HMO | Source: Ambulatory Visit | Attending: Physical Medicine & Rehabilitation | Admitting: Physical Medicine & Rehabilitation

## 2016-12-03 DIAGNOSIS — M50221 Other cervical disc displacement at C4-C5 level: Secondary | ICD-10-CM | POA: Diagnosis not present

## 2016-12-03 DIAGNOSIS — M47812 Spondylosis without myelopathy or radiculopathy, cervical region: Secondary | ICD-10-CM | POA: Insufficient documentation

## 2016-12-03 DIAGNOSIS — M5382 Other specified dorsopathies, cervical region: Secondary | ICD-10-CM | POA: Insufficient documentation

## 2016-12-03 DIAGNOSIS — R269 Unspecified abnormalities of gait and mobility: Secondary | ICD-10-CM | POA: Diagnosis present

## 2016-12-03 DIAGNOSIS — M542 Cervicalgia: Secondary | ICD-10-CM

## 2016-12-03 DIAGNOSIS — M4802 Spinal stenosis, cervical region: Secondary | ICD-10-CM | POA: Insufficient documentation

## 2016-12-03 DIAGNOSIS — M2548 Effusion, other site: Secondary | ICD-10-CM | POA: Diagnosis not present

## 2016-12-04 ENCOUNTER — Telehealth: Payer: Self-pay | Admitting: *Deleted

## 2016-12-04 DIAGNOSIS — M4802 Spinal stenosis, cervical region: Secondary | ICD-10-CM

## 2016-12-04 DIAGNOSIS — M4712 Other spondylosis with myelopathy, cervical region: Secondary | ICD-10-CM

## 2016-12-04 NOTE — Telephone Encounter (Signed)
-----   Message from Charlett Blake, MD sent at 12/04/2016  6:55 AM EDT ----- Please refer to Dr Ronnald Ramp or Dr Vertell Limber eval cervical spondylosis and stenosis.  Progressive gait disorder Please notify pt that referra; is being ,made

## 2016-12-04 NOTE — Progress Notes (Signed)
Please refer to Dr Ronnald Ramp or Dr Vertell Limber eval cervical spondylosis and stenosis.  Progressive gait disorder Please notify pt that referra; is being ,made

## 2016-12-04 NOTE — Telephone Encounter (Signed)
Cameron Willis notified referral was placed.

## 2016-12-05 ENCOUNTER — Encounter: Payer: Medicare HMO | Attending: Physical Medicine & Rehabilitation

## 2016-12-05 ENCOUNTER — Ambulatory Visit (HOSPITAL_BASED_OUTPATIENT_CLINIC_OR_DEPARTMENT_OTHER): Payer: Medicare HMO | Admitting: Physical Medicine & Rehabilitation

## 2016-12-05 ENCOUNTER — Encounter: Payer: Self-pay | Admitting: Physical Medicine & Rehabilitation

## 2016-12-05 VITALS — BP 125/80 | HR 63

## 2016-12-05 DIAGNOSIS — Z9889 Other specified postprocedural states: Secondary | ICD-10-CM | POA: Insufficient documentation

## 2016-12-05 DIAGNOSIS — M542 Cervicalgia: Secondary | ICD-10-CM | POA: Diagnosis not present

## 2016-12-05 DIAGNOSIS — G8250 Quadriplegia, unspecified: Secondary | ICD-10-CM

## 2016-12-05 DIAGNOSIS — M24561 Contracture, right knee: Secondary | ICD-10-CM | POA: Insufficient documentation

## 2016-12-05 DIAGNOSIS — Z87891 Personal history of nicotine dependence: Secondary | ICD-10-CM | POA: Diagnosis not present

## 2016-12-05 DIAGNOSIS — R269 Unspecified abnormalities of gait and mobility: Secondary | ICD-10-CM | POA: Diagnosis not present

## 2016-12-05 DIAGNOSIS — M50021 Cervical disc disorder at C4-C5 level with myelopathy: Secondary | ICD-10-CM | POA: Insufficient documentation

## 2016-12-05 DIAGNOSIS — I2510 Atherosclerotic heart disease of native coronary artery without angina pectoris: Secondary | ICD-10-CM | POA: Diagnosis not present

## 2016-12-05 DIAGNOSIS — I4891 Unspecified atrial fibrillation: Secondary | ICD-10-CM | POA: Diagnosis not present

## 2016-12-05 DIAGNOSIS — Z96659 Presence of unspecified artificial knee joint: Secondary | ICD-10-CM | POA: Insufficient documentation

## 2016-12-05 HISTORY — DX: Contracture, right knee: M24.561

## 2016-12-05 HISTORY — DX: Cervical disc disorder at C4-C5 level with myelopathy: M50.021

## 2016-12-05 NOTE — Progress Notes (Signed)
Subjective:    Patient ID: Cameron Willis, male    DOB: Oct 25, 1933, 83 y.o.   MRN: [id number] 83 year old male with Afib and CAD who was referred by his orthopedic surgeon after total knee replacement in October 2017. Postoperatively, he has had problems with extending his knee. Has been evaluated by orthopedic surgery. No hardware failure, patient has not ambulated since surgery. He is able to stand with a walker but unable to ambulate with a walker. He is undergoing physical therapy. Denies any right arm weakness Period no past history of stroke. No past history of spinal cord problems. Past history of head injury, brain surgery No past history of neck surgery. He does have some neck pain.  Left side of body, has no abnormalities noted by patient. HPI  Patient continues to use a wheelchair for ambulation. He has full function of his upper extremities as well as the left lower extremity, although he does have some tightness in his left hamstring as well. He denies any numbness or tingling in his hands or feet. He has no bowel or bladder dysfunction. No swallowing issues  He does have right-sided neck pain. Pain Inventory Average Pain 1 Pain Right Now 1 My pain is dull  In the last 24 hours, has pain interfered with the following? General activity 0 Relation with others 0 Enjoyment of life 0 What TIME of day is your pain at its worst? morning Sleep (in general) Good  Pain is worse with: standing Pain improves with: nothing Relief from Meds: 1  Mobility how many minutes can you walk? never ability to climb steps?  no do you drive?  no use a wheelchair needs help with transfers Do you have any goals in this area?  no  Function retired I need assistance with the following:  dressing and shopping Do you have any goals in this area?  no  Neuro/Psych No problems in this area  Prior Studies Any changes since last visit?  no  Physicians involved in your care Any changes  since last visit?  no   No family history on file. Social History   Social History  . Marital status: Married    Spouse name: N/A  . Number of children: N/A  . Years of education: N/A   Social History Main Topics  . Smoking status: Former Research scientist (life sciences)  . Smokeless tobacco: Never Used  . Alcohol use None  . Drug use: Unknown  . Sexual activity: Not Asked   Other Topics Concern  . None   Social History Narrative  . None   No past surgical history on file. No past medical history on file. BP 125/80   Pulse 63   SpO2 93%   Opioid Risk Score:   Fall Risk Score:  `1  Depression screen PHQ 2/9  Depression screen PHQ 2/9 11/07/2016  Decreased Interest 0  Down, Depressed, Hopeless 0  PHQ - 2 Score 0  Altered sleeping 0  Tired, decreased energy 0  Change in appetite 1  Feeling bad or failure about yourself  0  Trouble concentrating 0  Moving slowly or fidgety/restless 0  Suicidal thoughts 0  PHQ-9 Score 1  Difficult doing work/chores Not difficult at all    Review of Systems  Constitutional: Negative.   HENT: Negative.   Eyes: Negative.   Respiratory: Negative.   Cardiovascular: Negative.   Gastrointestinal: Negative.   Endocrine: Negative.   Genitourinary: Negative.   Musculoskeletal: Negative.   Skin: Negative.   Allergic/Immunologic:  Negative.   Neurological: Negative.   Hematological: Negative.   Psychiatric/Behavioral: Negative.        Objective:   Physical Exam  Constitutional: He is oriented to person, place, and time. He appears well-developed and well-nourished.  HENT:  Head: Normocephalic and atraumatic.  Eyes: Conjunctivae are normal. Pupils are equal, round, and reactive to light.  Neck:  Neck has decreased rotation to the right, possibly 50% to the left is 100% with extension of his neck. He does have right-sided neck pain as well as with right lateral bending of his neck. Tenderness to palpation in the right lateral neck  Musculoskeletal:    Right knee, tight hamstring, lacks around 45 of extension, left hamstring, tight palpable cords, lacks approximately 10 of extension  Patient can extend nearly to full in sitting position with stretching of the hamstring at the right knee, requires prolonged stretch  No evidence of knee effusion  Neurological: He is alert and oriented to person, place, and time. He displays abnormal reflex.  Reflex Scores:      Tricep reflexes are 3+ on the right side and 3+ on the left side.      Bicep reflexes are 3+ on the right side and 3+ on the left side.      Brachioradialis reflexes are 3+ on the right side and 3+ on the left side.      Patellar reflexes are 0 on the right side and 3+ on the left side.      Achilles reflexes are 2+ on the right side and 3+ on the left side. Psychiatric: He has a normal mood and affect. His behavior is normal. Judgment and thought content normal.  Nursing note and vitals reviewed.         Assessment & Plan:  1. Chronic hamstring spasm, underlying etiology unclear, question if some of this is contracture. However, he can be extended passively, he does have hyperactive reflexes, bilateral upper and lower extremities. Some hamstring tightness in the left lower extremity, which may be explained by wheelchair bound status. His orthopedist states that there is no evidence of hardware failure in the right knee.  He does have evidence of cervical stenosis on his cervical MRI and certainly this would explain his hyperactive reflexes. I would expect his upper extremities to be more affected than lower extremities, although he does have concomitant foraminal stenosis. I did show his MRI images to the patient and his wife.  I'm not sure if this is the etiology of the hamstring issues. However, I would like neurosurgery to evaluate for potential need of surgery. Referral to Dr. Boyce Medici neurosurgery  Return to clinic in one month

## 2016-12-05 NOTE — Patient Instructions (Signed)
Dr Ronnald Ramp' secretary  from [facility] will call to set up appt

## 2016-12-09 DIAGNOSIS — R269 Unspecified abnormalities of gait and mobility: Secondary | ICD-10-CM | POA: Diagnosis not present

## 2016-12-26 DIAGNOSIS — R531 Weakness: Secondary | ICD-10-CM | POA: Diagnosis not present

## 2016-12-26 DIAGNOSIS — R269 Unspecified abnormalities of gait and mobility: Secondary | ICD-10-CM | POA: Diagnosis not present

## 2017-01-02 ENCOUNTER — Encounter: Payer: Medicare HMO | Attending: Physical Medicine & Rehabilitation

## 2017-01-02 ENCOUNTER — Ambulatory Visit: Payer: Medicare HMO | Admitting: Physical Medicine & Rehabilitation

## 2017-01-02 DIAGNOSIS — I2510 Atherosclerotic heart disease of native coronary artery without angina pectoris: Secondary | ICD-10-CM | POA: Insufficient documentation

## 2017-01-02 DIAGNOSIS — R269 Unspecified abnormalities of gait and mobility: Secondary | ICD-10-CM | POA: Insufficient documentation

## 2017-01-02 DIAGNOSIS — M24561 Contracture, right knee: Secondary | ICD-10-CM | POA: Insufficient documentation

## 2017-01-02 DIAGNOSIS — Z87891 Personal history of nicotine dependence: Secondary | ICD-10-CM | POA: Insufficient documentation

## 2017-01-02 DIAGNOSIS — I4891 Unspecified atrial fibrillation: Secondary | ICD-10-CM | POA: Insufficient documentation

## 2017-01-02 DIAGNOSIS — M542 Cervicalgia: Secondary | ICD-10-CM | POA: Insufficient documentation

## 2017-01-02 DIAGNOSIS — Z96659 Presence of unspecified artificial knee joint: Secondary | ICD-10-CM | POA: Insufficient documentation

## 2017-01-02 DIAGNOSIS — Z9889 Other specified postprocedural states: Secondary | ICD-10-CM | POA: Insufficient documentation

## 2017-01-05 DIAGNOSIS — R269 Unspecified abnormalities of gait and mobility: Secondary | ICD-10-CM | POA: Diagnosis not present

## 2017-01-12 DIAGNOSIS — R29898 Other symptoms and signs involving the musculoskeletal system: Secondary | ICD-10-CM | POA: Diagnosis not present

## 2017-01-13 DIAGNOSIS — R769 Abnormal immunological finding in serum, unspecified: Secondary | ICD-10-CM | POA: Diagnosis not present

## 2017-01-13 DIAGNOSIS — M48061 Spinal stenosis, lumbar region without neurogenic claudication: Secondary | ICD-10-CM | POA: Diagnosis not present

## 2017-01-13 DIAGNOSIS — R269 Unspecified abnormalities of gait and mobility: Secondary | ICD-10-CM | POA: Diagnosis not present

## 2017-01-13 DIAGNOSIS — M5126 Other intervertebral disc displacement, lumbar region: Secondary | ICD-10-CM | POA: Diagnosis not present

## 2017-01-14 DIAGNOSIS — G629 Polyneuropathy, unspecified: Secondary | ICD-10-CM | POA: Diagnosis not present

## 2017-01-14 DIAGNOSIS — R29898 Other symptoms and signs involving the musculoskeletal system: Secondary | ICD-10-CM | POA: Diagnosis not present

## 2017-01-14 DIAGNOSIS — R292 Abnormal reflex: Secondary | ICD-10-CM | POA: Diagnosis not present

## 2017-01-14 DIAGNOSIS — G5603 Carpal tunnel syndrome, bilateral upper limbs: Secondary | ICD-10-CM | POA: Diagnosis not present

## 2017-01-26 DIAGNOSIS — I10 Essential (primary) hypertension: Secondary | ICD-10-CM | POA: Diagnosis not present

## 2017-01-26 DIAGNOSIS — R292 Abnormal reflex: Secondary | ICD-10-CM | POA: Diagnosis not present

## 2017-02-05 ENCOUNTER — Encounter (INDEPENDENT_AMBULATORY_CARE_PROVIDER_SITE_OTHER): Payer: Self-pay

## 2017-02-05 ENCOUNTER — Ambulatory Visit: Payer: Medicare HMO | Admitting: Neurology

## 2017-02-05 ENCOUNTER — Ambulatory Visit (INDEPENDENT_AMBULATORY_CARE_PROVIDER_SITE_OTHER): Payer: Medicare HMO | Admitting: Neurology

## 2017-02-05 ENCOUNTER — Encounter: Payer: Self-pay | Admitting: Neurology

## 2017-02-05 VITALS — BP 141/84 | HR 64

## 2017-02-05 DIAGNOSIS — G5731 Lesion of lateral popliteal nerve, right lower limb: Secondary | ICD-10-CM | POA: Diagnosis not present

## 2017-02-05 DIAGNOSIS — R292 Abnormal reflex: Secondary | ICD-10-CM | POA: Diagnosis not present

## 2017-02-05 DIAGNOSIS — M6259 Muscle wasting and atrophy, not elsewhere classified, multiple sites: Secondary | ICD-10-CM | POA: Diagnosis not present

## 2017-02-05 DIAGNOSIS — G729 Myopathy, unspecified: Secondary | ICD-10-CM | POA: Diagnosis not present

## 2017-02-05 DIAGNOSIS — R531 Weakness: Secondary | ICD-10-CM

## 2017-02-05 DIAGNOSIS — R768 Other specified abnormal immunological findings in serum: Secondary | ICD-10-CM | POA: Diagnosis not present

## 2017-02-05 NOTE — Patient Instructions (Signed)
You may have a more widespread degree of weakness and perhaps an underlying neuromuscular cause for your symptoms of gradual weakness and inability to walk after your right knee replacement surgery. You do have degenerative spine disease affecting both your neck and lumbar spine area. We will do blood work today and call you with the test results. The will arrange for a repeat EMG and nerve conduction test in our office to check upper and lower extremities and we will call you with the test results.

## 2017-02-05 NOTE — Progress Notes (Signed)
Subjective:    Patient ID: Cameron Willis is a 83 y.o. male.  HPI     Star Age, MD, PhD [facility] [address] [place], [place] [address]  Dear Dr. Ronnald Ramp,   I saw your patient, Cameron Willis, upon your kind request, in my neurologic clinic today for initial consultation of his gait disturbance. The patient is accompanied by his wife today. As you know, Cameron Willis is an 83 year old right-handed gentleman with an underlying medical history of degenerative disc disease in the cervical spine, and degenerative spine disease of the lumbar spine including lumbar spine stenosis, paroxysmal A. fib, arthritis with status post right total knee replacement in August 2017, who reports progressive weakness. He has seen Dr. Letta Pate recently. He had a cervical spine MRI on 12/03/2016 without contrast which I reviewed: IMPRESSION: 1. Multilevel cervical spondylosis greatest at the C4-5 level where a right central disc protrusion impinges the right anterior cord with cord flattening. 2. Mild C4-5 and C5-6 canal stenosis.  No high-grade canal stenosis. 3. Extensive uncovertebral and facet hypertrophy with multiple levels of advanced foraminal narrowing bilaterally. 4. Right-sided C3-4 small facet effusion and mild facet edema, likely degenerative.  He subsequently had a thoracic spine MRI without contrast through your office on 12/26/2016 and I reviewed the results: Impression: No abnormality of the thoracic spinal cord. No spinal canal or neural foraminal stenosis.  You ordered a lumbar spine MRI and EMG and nerve conduction test. He was noted to have hyperreflexia, he has had neck pain. He has had low back pain. I reviewed your office note from 01/05/2017, which you kindly included. Prior to that, you saw him on 12/09/2016.  Unfortunately, we did not have any test results on the lumbar spine MRI and no test results on the EMG and nerve conduction test. We  called her office several times and were able to get the last office note from 01/26/2017 at which time we discussed his EMG results and lumbar spine MRI results. We did receive a copy of the lumbar spine MRI report from his test on 01/13/2017. Impression: Degenerative change of the lumbar spine superimposed on borderline congenital canal narrowing. Severe canal stenosis at L3-4, mild at L1-2 and L2-3. Multilevel neural foraminal narrowing: Moderate to severe on the right at L4-5. Bilateral L3-4 facet arthropathy with acute reactive changes. He had EMG and nerve conduction testing of both lower extremities on 01/14/2017 under Dr. Brien Few. A formal typed report is not available for my review today. We've requested the report from your office but were told that there is none at this time.  The patient reports that he has had weakness and muscle atrophy in his upper extremities for the past few years, perhaps 7 years, with gradual worsening. His major complaint is the inability to walk since his right total knee replacement surgery in August 2017. He had arthroscopic surgery a few weeks after as I understand. This was under Dr. Lorin Mercy. He has also seen Dr. Lorin Mercy' PA in follow-up. He has not recently fallen. He has had tightening of his tendons of both hamstrings. He reports no significant pain at this time. He is essentially wheelchair bound. He had physical therapy but did not make any progress as I understand. He does not report any difficulty swallowing or breathing.  He quit smoking over 10 years ago. He is retired. He lives with his wife. They have one son. He worked as a Dealer and also on a  farm. He reports that he also may have had a hip MRI through your office. Results are not available for my review on that test.   His Past Medical History Is Significant For: No past medical history on file.  His Past Surgical History Is Significant For: No past surgical history on file.  His Family History Is  Significant For: No family history on file.  His Social History Is Significant For: Social History   Social History  . Marital status: Married    Spouse name: N/A  . Number of children: N/A  . Years of education: N/A   Social History Main Topics  . Smoking status: Former Research scientist (life sciences)  . Smokeless tobacco: Never Used  . Alcohol use None  . Drug use: Unknown  . Sexual activity: Not Asked   Other Topics Concern  . None   Social History Narrative  . None    His Allergies Are:  No Known Allergies:   His Current Medications Are:  No outpatient encounter prescriptions on file as of 02/05/2017.   No facility-administered encounter medications on file as of 02/05/2017.   :   Review of Systems:  Out of a complete 14 point review of systems, all are reviewed and negative with the exception of these symptoms as listed below: Review of Systems  Neurological:       Pt presents today to discuss his gait. Since his knee replacement surgery on August 10th, 2017, pt has been unable to walk. Pt is currently undergoing PT. Pt reports he can take 1-2 steps with a walker but that is all.    Objective:  Neurological Exam  Physical Exam Physical Examination:   Vitals:   02/05/17 1426  BP: (!) 141/84  Pulse: 64   General Examination: The patient is a very pleasant 83 y.o. male in no acute distress. He appearsFrail, he is situated in his wheelchair. He is adequately groomed.  HEENT: Normocephalic, atraumatic, pupils are equal, round and reactive to light and accommodation. Extraocular tracking is good without limitation to gaze excursion or nystagmus noted. Normal smooth pursuit is noted. Hearing is grossly intact. Face is symmetric with normal facial animation and normal facial sensation. Speech is clear with no dysarthria noted. There is no hypophonia. There is no lip, neck/head, jaw or voice tremor. Neck shows mild decrease in range of motion. Airway examination reveals mild mouth dryness,  adequate dental hygiene, mild airway crowding. Tongue protrudes centrally and palate elevates symmetrically.   Chest: Clear to auscultation without wheezing, rhonchi or crackles noted.  Heart: S1+S2+0, regular and normal without murmurs, rubs or gallops noted.   Abdomen: Soft, non-tender and non-distended with normal bowel sounds appreciated on auscultation.  Extremities: There is no pitting edema in the distal lower extremities bilaterally. Pedal pulses are intact.  Skin: Warm and dry without trophic changes noted.  Musculoskeletal: exam reveals unremarkable scar of right total knee replacement and additional arthroscopic scars. Well-healed. He has no significant joint swelling. He has global muscle wasting, particularly noticeable in both biceps areas and also both thigh areas, mild tightness in the hamstrings, wasting of thenar and hyperthenar eminences. Fifth digit curled in bilaterally.   Neurologically:  Mental status: The patient is awake, alert and oriented in all 4 spheres. His immediate and remote memory, attention, language skills and fund of knowledge are appropriate. There is no evidence of aphasia, agnosia, apraxia or anomia. Speech is clear with normal prosody and enunciation. Thought process is linear. Mood is normal and affect is  normal.  Cranial nerves II - XII are as described above under HEENT exam. In addition: shoulder shrug is normal with equal shoulder height noted. Motor exam: Thin bulk, global strength of 4 out of 5, weaker in both hip flexors, also weaker in the right foot dorsiflexion. Mild grip strength weakness noted, inability to fully open both hands. No resting tremor. Romberg is not testable.  fine motor skills are globally mildly impaired. No lateralization noted. No obvious fasciculation is noted.no obvious dysmetria or intention tremor but he is unable to do heel to shin.  Sensory exam: intact to light touch.  when attempting to stand, he is able to push himself  up but stands with his legs wide-based, not fully extending both knees, unable to stand unsupported and unable to walk.   Assessment and Plan:  In summary, Cameron Willis is a very pleasant 83 y.o.-year old male with an underlying medical history of degenerative disc disease in the cervical spine, and degenerative spine disease of the lumbar spine including lumbar spine stenosis, paroxysmal A. fib, arthritis with status post right total knee replacement in August 2017, who presents for evaluation of his weakness, which has been slow and gradually progressive, but has culminated in inability to walk after his knee replacement surgery last year. On examination, he has global muscle wasting and global weakness, particularly noticeable in both hip flexors, right foot dorsiflexor, both upper extremities, no obvious telltale fasciculations noted, mild hyperreflexia noted, no significant sensory deficit noted, no obvious bulbar weakness noted. He had recent abnormal EMG/nerve conduction velocity testing of his lower extremities in your office, we will request the official test results and the written report. He has had recent workup through your office with MRI of the thoracic and lumbar spine and perhaps also MRI of the hips. Previously, he had a cervical spine MRI in May 2018, I reviewed available test results. Neuromuscular disease or motor neuron disease is not excluded. I will proceed with requesting a 4 limb EMG and nerve conduction test in our office, we will do some blood work today and call them with the test results. I talked to the patient and his wife at length today. I will see him back after testing is completed.   Thank you very much for allowing me to participate in the care of this nice patient. If I can be of any further assistance to you please do not hesitate to call me at [phone].  Sincerely,   Star Age, MD, PhD

## 2017-02-06 LAB — ALDOLASE: ALDOLASE: 3.3 U/L (ref 3.3–10.3)

## 2017-02-06 NOTE — Progress Notes (Signed)
Please call pt or wife: labs for the most part okay, except borderline A1c in the prediabetes range, borderline low Vit D and mildly elevated inflammatory marker called CRP; this could be from neck pain, back pain, arthritis, etc.  It may be worthwhile to start OTC Vitamin D supplement: 1000-2000 units daily of any vitamin D supplement of His choice should be fine. I would recommend recheck of vitamin D status in 3-6 months with PCP.  No other action req.  thx Star Age, MD, PhD [facility])

## 2017-02-07 LAB — TSH: TSH: 1.77 u[IU]/mL (ref 0.450–4.500)

## 2017-02-07 LAB — C-REACTIVE PROTEIN: CRP: 9.2 mg/L — ABNORMAL HIGH (ref 0.0–4.9)

## 2017-02-07 LAB — VITAMIN D 25 HYDROXY (VIT D DEFICIENCY, FRACTURES): Vit D, 25-Hydroxy: 29 ng/mL — ABNORMAL LOW (ref 30.0–100.0)

## 2017-02-07 LAB — ENA+DNA/DS+SJORGEN'S
ENA RNP Ab: 7.5 AI — ABNORMAL HIGH (ref 0.0–0.9)
ENA SM Ab Ser-aCnc: <0.2 AI (ref 0.0–0.9)
ENA SSA (RO) Ab: <0.2 AI (ref 0.0–0.9)
ENA SSB (LA) Ab: <0.2 AI (ref 0.0–0.9)
dsDNA Ab: 1 IU/mL (ref 0–9)

## 2017-02-07 LAB — B12 AND FOLATE PANEL
Folate: 5.2 ng/mL (ref >3.0)
Vitamin B-12: 259 pg/mL (ref 232–1245)

## 2017-02-07 LAB — HGB A1C W/O EAG: Hgb A1c MFr Bld: 5.8 % — ABNORMAL HIGH (ref 4.8–5.6)

## 2017-02-07 LAB — SEDIMENTATION RATE: Sed Rate: 14 mm/hr (ref 0–30)

## 2017-02-07 LAB — RPR: RPR: NONREACTIVE

## 2017-02-07 LAB — CK: Total CK: 28 U/L (ref 24–204)

## 2017-02-07 LAB — ANA W/REFLEX: Anti Nuclear Antibody(ANA): POSITIVE — AB

## 2017-02-09 ENCOUNTER — Telehealth: Payer: Self-pay

## 2017-02-09 NOTE — Telephone Encounter (Signed)
-----   Message from Star Age, MD sent at 02/06/2017  1:56 PM EDT ----- Please call pt or wife: labs for the most part okay, except borderline A1c in the prediabetes range, borderline low Vit D and mildly elevated inflammatory marker called CRP; this could be from neck pain, back pain, arthritis, etc.  It may be worthwhile to start OTC Vitamin D supplement: 1000-2000 units daily of any vitamin D supplement of His choice should be fine. I would recommend recheck of vitamin D status in 3-6 months with PCP.  No other action req.  thx Star Age, MD, PhD [facility])

## 2017-02-09 NOTE — Telephone Encounter (Signed)
I called pt. I explained to pt that Dr. Rexene Alberts reviewed his labs and found that pt's A1c was in the prediabetes range. Pt also had borderline low vitamin d. Pt's CRP was also mildly elevated which could be from neck pain, back pain, arthritis, etc. Dr. Rexene Alberts recommends that he start OTC vitamin D supplement, 1000-2000 units daily and rechecking his vitamin d levels with his PCP in 3-6 months. Pt verbalized understanding of results. Pt had no questions at this time but was encouraged to call back if questions arise.

## 2017-02-12 ENCOUNTER — Encounter: Payer: Medicare HMO | Admitting: Diagnostic Neuroimaging

## 2017-02-12 ENCOUNTER — Ambulatory Visit: Payer: Medicare HMO

## 2017-02-12 ENCOUNTER — Encounter: Payer: Self-pay | Admitting: Diagnostic Neuroimaging

## 2017-02-26 ENCOUNTER — Encounter (INDEPENDENT_AMBULATORY_CARE_PROVIDER_SITE_OTHER): Payer: Self-pay | Admitting: Diagnostic Neuroimaging

## 2017-02-26 ENCOUNTER — Ambulatory Visit (INDEPENDENT_AMBULATORY_CARE_PROVIDER_SITE_OTHER): Payer: Medicare HMO | Admitting: Diagnostic Neuroimaging

## 2017-02-26 DIAGNOSIS — Z0289 Encounter for other administrative examinations: Secondary | ICD-10-CM

## 2017-02-26 DIAGNOSIS — R531 Weakness: Secondary | ICD-10-CM | POA: Diagnosis not present

## 2017-02-26 DIAGNOSIS — M6259 Muscle wasting and atrophy, not elsewhere classified, multiple sites: Secondary | ICD-10-CM

## 2017-02-26 DIAGNOSIS — R292 Abnormal reflex: Secondary | ICD-10-CM

## 2017-02-27 NOTE — Procedures (Signed)
[facility]  NCS (NERVE CONDUCTION STUDY) WITH EMG (ELECTROMYOGRAPHY) REPORT   STUDY DATE: 02/26/17 PATIENT NAME: Cameron Willis DOB: 1933-09-09 MRN: [id number]  ORDERING CLINICIAN: Star Age, MD PhD   TECHNOLOGIST: Oneita Jolly ELECTROMYOGRAPHER: Earlean Polka. Claressa Hughley, MD  CLINICAL INFORMATION: 83 year old male with weakness and hyperreflexia. Patient has severe lumbar spinal stenosis. Evaluate for motor neuron disease, neuropathy or radiculopathy.   FINDINGS: NERVE CONDUCTION STUDY: Right median motor response is normal.  Right ulnar motor response has prolonged distal latency, normal amplitude, mildly slowed conduction velocity.  Right peroneal motor response with recording over extensor digitorum brevis and tibialis anterior could not be obtained.  Left peroneal motor response has prolonged distal latency, decreased amplitude, normal conduction velocity.  Bilateral tibial motor responses have normal distal latencies, decreased amplitudes, mildly slowed conduction velocities.  Right radial sensory response has slightly prolonged peak latency and decreased amplitude.  Right median sensory response has prolonged peak latency and normal amplitude.  Right ulnar sensory response has prolonged peak latency and decreased amplitude.  Bilateral sural and left superficial peroneal sensory responses are normal.  Right superficial peroneal sensory response could not be obtained.  Right ulnar F wave latency is prolonged. Right median F wave latency is normal. Bilateral tibial F wave latencies are normal.   NEEDLE ELECTROMYOGRAPHY:  Needle examination of cervical, thoracic and lumbar paraspinal muscles shows myotonic discharges in bilateral cervical paraspinal muscles. Thoracic and lumbar paraspinal muscles are normal.  Needle examination of right upper extremity shows early recruitment of polyphasic motor units in right biceps and right triceps muscles. Right  deltoid, right flexor carpi radialis and right first dorsal interosseous muscles are normal.  Needle examination of right lower extremity shows 2+ fibrillation potentials and positive sharp waves in right tibialis anterior with decreased motor unit recruitment. Right gastrocnemius shows no abnormal spontaneous activity at rest and decreased motor unit recruitment on exertion. Right vastus medialis and right iliopsoas muscles are normal.   IMPRESSION:  Abnormal study demonstrating: 1. Severe right peroneal neuropathy, due to absent sensory and motor nerve responses. 2. Abnormal lower extremity motor responses, with corresponding normal sensory responses, can be seen with lumbar radiculopathies (given patient's lumbar spinal stenosis) or motor neuropathy. 3. 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 Myotonic discharges noted in cervical paraspinal muscles. These findings can be seen in multiple conditions including myotonic dystrophies, muscle channelopathies, inflammatory myopathies or other conditions. 4. Abnormal sensory and motor nerve responses in right upper extremity may be due to polyneuropathy or plexopathy, but not better localized with needle EMG.     INTERPRETING PHYSICIAN:  Penni Bombard, MD Certified in Neurology, Neurophysiology and Neuroimaging  [facility] [address] [place], [place] [address] [phone]   [facility]    Nerve / Sites Rec. Site Latency Ref. Amplitude Ref. Rel Amp Segments Distance Velocity Ref. Area    ms ms mV mV %  cm m/s m/s mVms  R Median - APB     Wrist APB 4.3 ?4.4 5.1 ?4.0 100 Wrist - APB 7   24.9     Upper arm APB 9.2  0.0  0.48 Upper arm - Wrist 24 49 ?49 23.3  R Ulnar - ADM     Wrist ADM 3.6 ?3.3 6.5 ?6.0 100 Wrist - ADM 7   19.8     B.Elbow ADM 8.0  5.9  90.9 B.Elbow - Wrist 20 46 ?49 20.0     A.Elbow ADM  10.3  5.8  98 A.Elbow - B.Elbow 10 44 ?49 20.2          A.Elbow - Wrist      L Peroneal - EDB     Ankle EDB 6.6 ?6.5 1.3 ?2.0 100 Ankle - EDB 9   4.5     Fib head EDB 13.6  1.2  93.3 Fib head - Ankle 30 42 ?44 4.3     Pop fossa EDB 16.6  1.1  86.3 Pop fossa - Fib head 12 41 ?44 3.6         Pop fossa - Ankle      R Peroneal - EDB     Ankle EDB NR ?6.5 NR ?2.0 NR Ankle - EDB 9   NR     Fib head EDB      Fib head - Ankle 30 NR ?44          Pop fossa - Ankle      L Tibial - AH     Ankle AH 5.1 ?5.8 1.7 ?4.0 100 Ankle - AH 9   5.9     Pop fossa AH 15.5  0.9  54.7 Pop fossa - Ankle 38 36 ?41 4.4  R Tibial - AH     Ankle AH 5.6 ?5.8 2.8 ?4.0 100 Ankle - AH 9   9.3     Pop fossa AH 15.8  1.7  58 Pop fossa - Ankle 38 37 ?41 6.6  R Peroneal - Tib Ant     Fib Head Tib Ant NR ?6.7 NR ?3.0 NR Fib Head - Tib Ant 10   NR     Pop fossa Tib Ant NR  NR  NR Pop fossa - Fib Head   ?[place]    Nerve / Sites Rec. Site Peak Lat Ref.  Amp Ref. Segments Distance    ms ms V V  cm  R Radial - Anatomical snuff box (Forearm)     Forearm Wrist 3.23 ?2.90 12 ?15 Forearm - Wrist 10  R Sural - Ankle (Calf)     Calf Ankle 3.44 ?4.40 8 ?6 Calf - Ankle 14  L Sural - Ankle (Calf)     Calf Ankle 3.85 ?4.40 5 ?6 Calf - Ankle 14  R Superficial peroneal - Ankle     Lat leg Ankle NR ?4.40 NR ?6 Lat leg - Ankle 14  L Superficial peroneal - Ankle     Lat leg Ankle 4.22 ?4.40 6 ?6 Lat leg - Ankle 14  R Median - Orthodromic (Dig II, Mid palm)     Dig II Wrist 4.11 ?3.40 12 ?10 Dig II - Wrist 13  R Ulnar - Orthodromic, (Dig V, Mid palm)     Dig V Wrist 3.28 ?3.10 3 ?5 Dig V - Wrist 45                   F  Wave    Nerve F Lat Ref.   ms ms  L Tibial - AH 58.4 ?56.0  R Tibial - AH 59.8 ?56.0  R Ulnar - ADM 34.8 ?32.0  R Median - APB 31.8 ?31.0             EMG full       EMG Summary Table    Spontaneous MUAP Recruitment  Muscle IA Fib PSW Fasc Other Amp  Dur. Poly Pattern  R. Tibialis anterior Normal 2+ 2+ None _______ Normal Normal Normal  Reduced  R. Gastrocnemius (Medial head) Increased None None None _______ Normal Normal Normal Reduced  R. Vastus medialis Normal None None None _______ Normal Normal Normal Normal  R. Iliopsoas Normal None None None _______ Normal Normal Normal Normal  R. Lumbar paraspinals Normal None None None _______ Normal Normal Normal Normal  R. Thoracic paraspinals Normal None None None _______ Normal Normal Normal Normal  R. Deltoid Normal None None None _______ Normal Normal Normal Normal  R. Biceps brachii Normal None None None _______ Decreased Normal 2+ Early  R. Triceps brachii Normal None None None _______ Decreased Normal 1+ Early  R. Flexor carpi radialis Normal None None None _______ Normal Normal Normal Normal  R. First dorsal interosseous Normal None None None _______ Normal Normal Normal Normal  R. Cervical paraspinals Normal None None None [place] Normal Normal Normal Normal

## 2017-03-06 NOTE — Progress Notes (Signed)
I apologize for the delay, as I wanted to talk directly to Dr. Leta Baptist about the test:  Please call and advise the patient or his wife that the recent EMG and nerve conduction velocity test, which is the electrical nerve and muscle test we we performed, was reported to several abnormalities. Some can be explained by the R total knee replacement (as in nerve damage), some changes could be in keeping with degenerative spine d/s of the neck and lower back (which is also at play in his case), some changes may reflect a problem with his muscles. It is complicated and we need to do more testing, if he allows.  We can talk about all this during a FU appointment too, if it may help.  To save time, I would like to go ahead and order 2 things: brain MRI to rule and a central problem and referral to NSG for a muscle biopsy (such as from the left arm) to see if there is a muscle d/o. Ultimately, I may ask him to see yet another specialist, as in neuromuscular specialist. I haven't placed the orders yet, please let me know, what they say.   Star Age, MD, PhD

## 2017-03-10 ENCOUNTER — Telehealth: Payer: Self-pay

## 2017-03-10 NOTE — Telephone Encounter (Signed)
I called pt. I explained to him that his recent NCV/EMG showed several abnormalities. Some can be explained by the R total knee replacement such as nerve damage, some changes could be explained by degenerative spine d/s of the neck and lower back, and some changes could reflect a problem with his muscles. Dr. Rexene Alberts wants to do more testing due to the complicated nature of these results. Dr. Rexene Alberts would like to go ahead and order an MRI brain to rule out a central nervous system problem and make a referral to [place] for a muscle biopsy to see if there is a muscle disorder. However, Dr. Rexene Alberts may ask the pt to see another specialist such as a neuromuscular specialist. Pt says that he wants to hold off on further testing right now, and wants an appt with Dr. Rexene Alberts. I don't have any open office visits with Dr. Rexene Alberts in the next month or so and 8:00am is too early an appt for this pt to be worked in. I will keep pt on my wait list to call for a cancellation opening. Pt verbalized understanding of results. Pt had no questions at this time but was encouraged to call back if questions arise.

## 2017-03-10 NOTE — Telephone Encounter (Signed)
-----   Message from Star Age, MD sent at 03/06/2017  4:13 PM EDT ----- I apologize for the delay, as I wanted to talk directly to Dr. Leta Baptist about the test:  Please call and advise the patient or his wife that the recent EMG and nerve conduction velocity test, which is the electrical nerve and muscle test we we performed, was reported to several abnormalities. Some can be explained by the R total knee replacement (as in nerve damage), some changes could be in keeping with degenerative spine d/s of the neck and lower back (which is also at play in his case), some changes may reflect a problem with his muscles. It is complicated and we need to do more testing, if he allows.  We can talk about all this during a FU appointment too, if it may help.  To save time, I would like to go ahead and order 2 things: brain MRI to rule and a central problem and referral to NSG for a muscle biopsy (such as from the left arm) to see if there is a muscle d/o. Ultimately, I may ask him to see yet another specialist, as in neuromuscular specialist. I haven't placed the orders yet, please let me know, what they say.   Star Age, MD, PhD

## 2017-03-10 NOTE — Telephone Encounter (Signed)
I called pt to offer him an appt today at 3:30pm with Dr. Rexene Alberts but he cannot make this appt. Will keep looking for any cancellations.

## 2017-03-10 NOTE — Telephone Encounter (Signed)
I called pt again, there was another cancellation on 03/12/17 at 1:00pm. Pt is agreeable to this appt with Dr. Rexene Alberts. Pt verbalized understanding of this appt date and time and to arrive 15 mins early.

## 2017-03-12 ENCOUNTER — Ambulatory Visit (INDEPENDENT_AMBULATORY_CARE_PROVIDER_SITE_OTHER): Payer: Medicare HMO | Admitting: Neurology

## 2017-03-12 ENCOUNTER — Encounter: Payer: Self-pay | Admitting: Neurology

## 2017-03-12 ENCOUNTER — Encounter (INDEPENDENT_AMBULATORY_CARE_PROVIDER_SITE_OTHER): Payer: Self-pay

## 2017-03-12 VITALS — BP 130/80 | HR 68

## 2017-03-12 DIAGNOSIS — R768 Other specified abnormal immunological findings in serum: Secondary | ICD-10-CM

## 2017-03-12 DIAGNOSIS — G729 Myopathy, unspecified: Secondary | ICD-10-CM | POA: Diagnosis not present

## 2017-03-12 DIAGNOSIS — M6259 Muscle wasting and atrophy, not elsewhere classified, multiple sites: Secondary | ICD-10-CM

## 2017-03-12 DIAGNOSIS — R531 Weakness: Secondary | ICD-10-CM

## 2017-03-12 DIAGNOSIS — G5731 Lesion of lateral popliteal nerve, right lower limb: Secondary | ICD-10-CM

## 2017-03-12 DIAGNOSIS — R292 Abnormal reflex: Secondary | ICD-10-CM

## 2017-03-12 NOTE — Patient Instructions (Signed)
You may have a more widespread muscle disorder.   I would like to suggest further workup: We will do a brain scan, called MRI and call you with the test results. We will have to schedule you for this on a separate date. This test requires authorization from your insurance, and we will take care of the insurance process.  I would like to send you to rheumatology for further evaluation.   We will request muscle biopsies from muscle of the left arm and leg.   You may have nerve damage around the right knee. Please avoid crossing your legs.   Work on [employer]ch[employer], retail exercises.   Stay well hydrated, and eat a well balanced diet.

## 2017-03-12 NOTE — Progress Notes (Signed)
Subjective:    Patient ID: Cameron Willis is a 83 y.o. male.  HPI     Interim history:   Cameron Willis is an 83 year old right-handed gentleman with an underlying medical history of degenerative disc disease in the cervical spine, and degenerative spine disease of the lumbar spine including lumbar spine stenosis, paroxysmal A. fib, arthritis with status post right total knee replacement in August 2017, who presents for follow-up consultation of his progressive weakness and hyperreflexia. The patient is accompanied by his wife again today. I first met him on 02/05/2017 at the request of his neurosurgeon, at which time patient reported inability to walk after her recent right total knee replacement within the year. He was noted to have more global weakness and hyperreflexia. I suggested that we proceed with further neuromuscular workup in the form of blood work and EMG and nerve conduction testing. He had prior EMG and nerve conduction testing but a formal report was not available. Blood work from 02/05/2017 showed A1c in the prediabetic range, borderline low vitamin D at 29, positive ANA with positive ENA RNP antibodies, normal muscle enzymes, normal TSH, normal RPR, normal B12, normal ESR.  EMG and nerve conduction testing of mostly the right upper and right lower extremities on 02/26/2017 showed: IMPRESSION:  Abnormal study demonstrating: 1. Severe right peroneal neuropathy, due to absent sensory and motor nerve responses. 2. Abnormal lower extremity motor responses, with corresponding normal sensory responses, can be seen with lumbar radiculopathies (given patient's lumbar spinal stenosis) or motor neuropathy. 3. 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 Myotonic discharges noted in cervical paraspinal muscles. These findings can be seen in multiple conditions including myotonic dystrophies, muscle channelopathies, inflammatory myopathies or other  conditions. 4. Abnormal sensory and motor nerve responses in right upper extremity may be due to polyneuropathy or plexopathy, but not better localized with needle EMG.  I suggested we talked about further workup.   Today, 03/12/2017 (all dictated new, as well as above notes, some dictation done in note pad or Word, outside of chart, may appear as copied):  He reports no new symptoms, his wife reports that he has had quite significant tightness in his posterior thigh muscles on the right side. She can feel the tendons and he does not always stretch out his right leg when he lays down. He does not walk at this time. He has noticed some pain in both arms, right more than left particularly with certain movements, feels like there is pain in the bones he says. He does not have tenderness upon palpation of his muscles. He has not fallen. He can pull himself up out of the wheelchair and can swing his legs around into bed. Shortly after his knee surgery he was much more weaker and wife had to help him get his legs in bed. His appetite is not very good, he is not a big eater but this is not a new problem. He does not drink much in the way of water. He does have quite a bit of nocturia, has a urinal for this.  Previously (copied from previous notes for reference):   02/05/2017: (He) reports progressive weakness. He has seen Dr. Letta Pate recently. He had a cervical spine MRI on 12/03/2016 without contrast which I reviewed: IMPRESSION: 1. Multilevel cervical spondylosis greatest at the C4-5 level where a right central disc protrusion impinges the right anterior cord with cord flattening. 2. Mild C4-5 and C5-6 canal stenosis.  No high-grade  canal stenosis. 3. Extensive uncovertebral and facet hypertrophy with multiple levels of advanced foraminal narrowing bilaterally. 4. Right-sided C3-4 small facet effusion and mild facet edema, likely degenerative.   He subsequently had a thoracic spine MRI without  contrast through your office on 12/26/2016 and I reviewed the results: Impression: No abnormality of the thoracic spinal cord. No spinal canal or neural foraminal stenosis.   You ordered a lumbar spine MRI and EMG and nerve conduction test. He was noted to have hyperreflexia, he has had neck pain. He has had low back pain. I reviewed your office note from 01/05/2017, which you kindly included. Prior to that, you saw him on 12/09/2016.   Unfortunately, we did not have any test results on the lumbar spine MRI and no test results on the EMG and nerve conduction test. We called her office several times and were able to get the last office note from 01/26/2017 at which time we discussed his EMG results and lumbar spine MRI results. We did receive a copy of the lumbar spine MRI report from his test on 01/13/2017. Impression: Degenerative change of the lumbar spine superimposed on borderline congenital canal narrowing. Severe canal stenosis at L3-4, mild at L1-2 and L2-3. Multilevel neural foraminal narrowing: Moderate to severe on the right at L4-5. Bilateral L3-4 facet arthropathy with acute reactive changes. He had EMG and nerve conduction testing of both lower extremities on 01/14/2017 under Dr. Bartko. A formal typed report is not available for my review today. We've requested the report from your office but were told that there is none at this time.   The patient reports that he has had weakness and muscle atrophy in his upper extremities for the past few years, perhaps 7 years, with gradual worsening. His major complaint is the inability to walk since his right total knee replacement surgery in August 2017. He had arthroscopic surgery a few weeks after as I understand. This was under Dr. Yates. He has also seen Dr. Yates' PA in follow-up. He has not recently fallen. He has had tightening of his tendons of both hamstrings. He reports no significant pain at this time. He is essentially wheelchair bound. He had  physical therapy but did not make any progress as I understand. He does not report any difficulty swallowing or breathing.   He quit smoking over 81 years ago. He is retired. He lives with his wife. They have one son. He worked as a mechanic and also on a farm. He reports that he also may have had a hip MRI through your office. Results are not available for my review on that test.  His Past Medical History Is Significant For: No past medical history on file.  His Past Surgical History Is Significant For: No past surgical history on file.  His Family History Is Significant For: No family history on file.  His Social History Is Significant For: Social History   Social History  . Marital status: Married    Spouse name: N/A  . Number of children: N/A  . Years of education: N/A   Social History Main Topics  . Smoking status: Former Smoker  . Smokeless tobacco: Never Used  . Alcohol use None  . Drug use: Unknown  . Sexual activity: Not Asked   Other Topics Concern  . None   Social History Narrative  . None    His Allergies Are:  No Known Allergies:   His Current Medications Are:  No outpatient encounter prescriptions on file   as of 03/12/2017.   No facility-administered encounter medications on file as of 03/12/2017.   :  Review of Systems:  Out of a complete 14 point review of systems, all are reviewed and negative with the exception of these symptoms as listed below:  Review of Systems  Neurological:       Pt presents today to discuss his results and subsequent treatment options.    Objective:  Neurological Exam  Physical Exam Physical Examination:   Vitals:   03/12/17 1307  BP: 130/80  Pulse: 68    General Examination: The patient is a very pleasant 83 y.o. male in no acute distress. He appears well-developed and well-nourished and well groomed.   HEENT: Normocephalic, atraumatic, pupils are equal, round and reactive to light and accommodation. Extraocular  tracking is good without limitation to gaze excursion or nystagmus noted. Normal smooth pursuit is noted. Hearing is grossly intact. Face is symmetric with normal facial animation and normal facial sensation. Speech is clear with no dysarthria noted. There is no hypophonia. There is no lip, neck/head, jaw or voice tremor. Neck shows mild decrease in range of motion. Airway examination reveals mild mouth dryness, adequate dental hygiene, mild airway crowding. Tongue protrudes centrally and palate elevates symmetrically.   Chest: Clear to auscultation without wheezing, rhonchi or crackles noted.  Heart: S1+S2+0, regular and normal without murmurs, rubs or gallops noted.   Abdomen: Soft, non-tender and non-distended with normal bowel sounds appreciated on auscultation.  Extremities: There is no pitting edema in the distal lower extremities bilaterally. Pedal pulses are intact.  Skin: Warm and dry without trophic changes noted.  Musculoskeletal: exam reveals unremarkable scar of right total knee replacement and additional arthroscopic scars. Well-healed. He has no significant joint swelling. He has global muscle wasting, particularly noticeable in both biceps areas and also both thigh areas, mild tightness in the hamstrings, more on R, wasting of thenar and hyperthenar eminences. Fifth digit curled in bilaterally.   Neurologically:  Mental status: The patient is awake, alert and oriented in all 4 spheres. His immediate and remote memory, attention, language skills and fund of knowledge are appropriate. There is no evidence of aphasia, agnosia, apraxia or anomia. Speech is clear with normal prosody and enunciation. Thought process is linear. Mood is normal and affect is normal.  Cranial nerves II - XII are as described above under HEENT exam. In addition: shoulder shrug is normal with equal shoulder height noted. Motor exam: Thin bulk, global strength of 4 out of 5, weaker in both hip flexors,  also weaker in the right foot dorsiflexion, all stable. Mild grip strength weakness noted, inability to fully open both hands. No resting tremor. Romberg is not testable. Reflexes are about 2+ in both upper extremities, 3+ in both knees, trace in both ankles. Fine motor skills are globally moderately impaired in both hands, mildly impaired in the left lower extremity, moderately impaired in the right lower extremity, heel to shin is not possible. No obvious fasciculations noted, no obvious dysmetria or intention tremor but he is unable to do heel to shin.  Sensory exam: intact to light touch. Unable to stand unsupported and unable to walk.   Assessment and Plan:  In summary, Cameron Willis is a very pleasant 83 year old male with an underlying medical history of degenerative disc disease in the cervical spine, and degenerative spine disease of the lumbar spine including lumbar spine stenosis, paroxysmal A. fib, arthritis with status post right total knee replacement in August 2017,  who presents forFollow-up consultation of his progressive weakness, also hyperreflexia and inability to walk since his knee replacement surgery last year. We did additional testing since her first visit in July 2018 in the form of EMG nerve conduction testing and blood work. He does have a positive ANA and ENA RNP antibodies and EMG, nerve conduction testing concerning for a more widespread myopathic disorder, differential diagnosis given his history and age could be a rare form of muscular dystrophy versus inclusion body myositis. He had prior testing in the form of EMG nerve conduction testing to his neurosurgeon as well as cervical, thoracic and lumbar spine MRIs. His situation is complicated by recent surgery, probably resulting also in peroneal neuropathy, degenerative spine disease with lumbar spine stenosis.  His situation is complicated. He is advised to continue strengthening exercises, eat right with a well balanced diet,  hydrate better with water. As far as testing is concerned I would like to request muscle biopsies from the left upper and lower extremities, I made a referral for neurosurgery in that regard. He is agreeable. Furthermore, I suggested opinion from rheumatology because of some abnormal lab findings and also concern for a inflammatory process perhaps, he does report some arm pain lately. In addition, I would like to exclude a structural process or slow growing tumor even by doing brain MRI with and without contrast. Ultimately, we may have to send him for a more specialized evaluation to either do or [facility] or [facility], neuromuscular division. For now, we will keep them posted as to their test results and evaluations and take it from there. I answered all their questions today and the patient and his wife were in agreement.  I spent 30 minutes in total face-to-face time with the patient, more than 50% of which was spent in counseling and coordination of care, reviewing test results, reviewing medication and discussing or reviewing the diagnosis of weakness, hyperreflexia, abn EMG, the prognosis and treatment options. Pertinent laboratory and imaging test results that were available during this visit with the patient were reviewed by me and considered in my medical decision making (see chart for details).   

## 2017-03-13 ENCOUNTER — Telehealth: Payer: Self-pay | Admitting: Neurology

## 2017-03-13 NOTE — Telephone Encounter (Signed)
I called Cameron Willis back and she just wanted to make sure she had all the information written down correctly for his MRI for Tuesday 03/17/17 at [facility].

## 2017-03-13 NOTE — Telephone Encounter (Signed)
Pt asking to speak with Raquel Sarna re: the MRI, please call

## 2017-03-17 ENCOUNTER — Telehealth: Payer: Self-pay | Admitting: Neurology

## 2017-03-17 ENCOUNTER — Encounter: Payer: Self-pay | Admitting: Neurology

## 2017-03-17 DIAGNOSIS — R531 Weakness: Secondary | ICD-10-CM | POA: Diagnosis not present

## 2017-03-17 NOTE — Telephone Encounter (Signed)
Caryl Pina called  form [facility] relayed for biopsy patient can go to   General surgery.

## 2017-03-18 ENCOUNTER — Telehealth: Payer: Self-pay | Admitting: Neurology

## 2017-03-18 NOTE — Telephone Encounter (Signed)
Please advise patient or wife, that I reviewed his brain MRI results from [facility] in [place]. He had a brain MRI with and without contrast on 03/17/2017 which showed: Moderate atrophy, which is volume loss. No acute findings. There were changes in the deeper structures of the brain, which we call white matter changes or microvascular changes. These were reported as mild in His case. These are tiny white spots, that occur with time and are seen in a variety of conditions, including with normal aging, chronic hypertension, chronic headaches, especially migraine HAs, chronic diabetes, chronic hyperlipidemia. These are not strokes and no mass or lesion or contrast enhancement were seen which is reassuring.  Overall, no explanation for his muscle weakness. As discussed, we will proceed with muscle biopsy testing. We should've made a referral for this already. Star Age, MD, PhD

## 2017-03-19 NOTE — Telephone Encounter (Signed)
I had to to send to [facility] for biopsy . Called patient with details.

## 2017-03-19 NOTE — Telephone Encounter (Signed)
I called pt. I advised him that his brain MRI results showed moderate atrophy. I advised him that he has mild white matter changes and I discussed the conditions that can cause white matter changes. I advised him that there were no acute findings but no explanation for his muscle weakness. Pt should proceed with muscle biopsy testing. Pt says that the [facility] office has not contacted him yet. Will ask Hinton Dyer to research this. Pt verbalized understanding of reuslts. Pt had no questions at this time but was encouraged to call back if questions arise.

## 2017-03-19 NOTE — Telephone Encounter (Signed)
Order has to be sent to [facility]. Surg telephone [phone] - fax [phone].

## 2017-04-03 ENCOUNTER — Telehealth: Payer: Self-pay | Admitting: Neurology

## 2017-04-03 NOTE — Telephone Encounter (Signed)
--   Please do Muscle Bx for concern for myopathy, inclusion body myositis? Pls do biopsy of L triceps and biceps and L thigh/quad Global weakness, hyperreflexia, recent R TKA. Abn EMG/NCV  Patient has his consultation 04/13/2017 at [facility] . I have spoke to patient and he is aware of apt. I relayed to patient he has a follow up with Dr. Rexene Alberts 05/11/2017 arrive at 1:30 for 2:00 apt. I relayed to patient if he had not had his biopsy buy then to call the office and we would try and get his apt moved. Thanks Hinton Dyer.

## 2017-04-07 NOTE — Telephone Encounter (Signed)
Noted, thanks!

## 2017-04-13 ENCOUNTER — Ambulatory Visit: Payer: Self-pay | Admitting: General Surgery

## 2017-04-13 DIAGNOSIS — M6281 Muscle weakness (generalized): Secondary | ICD-10-CM | POA: Diagnosis not present

## 2017-04-13 NOTE — H&P (Signed)
History of Present Illness Ralene Ok MD; 04/13/2017 1:38 PM) The patient is a 83 year old male who presents with a complaint of Muscle weakness. Referred by: Dr. Rexene Alberts Chief Complaint: Muscle weakness  Patient is an 83 year old male who has had a two-month history of low chrie weakness. Patient underwent total knee replacement in August 2017. Patient had further studies including EMG and nerve conduction studies. Patient has been worked up for further neurological issues. He states he is unable to walk this time. He states this is secondary to weakness. Patient states he has no upper extremity weakness.     Past Surgical History Erline Levine, RN; 04/13/2017 1:24 PM) Coronary Artery Bypass Graft   Diagnostic Studies History Erline Levine, RN; 04/13/2017 1:24 PM) Colonoscopy  >10 years ago  Allergies Erline Levine, RN; 04/13/2017 1:27 PM) No Known Allergies 04/13/2017 Allergies Reconciled   Medication History Erline Levine, RN; 04/13/2017 1:27 PM) No Current Medications Medications Reconciled  Social History Erline Levine, RN; 04/13/2017 1:24 PM) Caffeine use  Carbonated beverages, Coffee, Tea. No alcohol use  No drug use  Tobacco use  Former smoker.  Family History Erline Levine, RN; 04/13/2017 1:24 PM) Family history unknown  First Degree Relatives   Other Problems Erline Levine, RN; 04/13/2017 1:24 PM) Myocardial infarction     Review of Systems Erline Levine RN; 04/13/2017 1:24 PM) General Not Present- Appetite Loss, Chills, Fatigue, Fever, Night Sweats, Weight Gain and Weight Loss. Skin Present- Non-Healing Wounds. Not Present- Change in Wart/Mole, Dryness, Hives, Jaundice, New Lesions, Rash and Ulcer. HEENT Not Present- Earache, Hearing Loss, Hoarseness, Nose Bleed, Oral Ulcers, Ringing in the Ears, Seasonal Allergies, Sinus Pain, Sore Throat, Visual Disturbances, Wears glasses/contact lenses and Yellow Eyes. Respiratory Not Present- Bloody sputum, Chronic  Cough, Difficulty Breathing, Snoring and Wheezing. Breast Not Present- Breast Mass, Breast Pain, Nipple Discharge and Skin Changes. Cardiovascular Not Present- Chest Pain, Difficulty Breathing Lying Down, Leg Cramps, Palpitations, Rapid Heart Rate, Shortness of Breath and Swelling of Extremities. Gastrointestinal Not Present- Abdominal Pain, Bloating, Bloody Stool, Change in Bowel Habits, Chronic diarrhea, Constipation, Difficulty Swallowing, Excessive gas, Gets full quickly at meals, Hemorrhoids, Indigestion, Nausea, Rectal Pain and Vomiting. Male Genitourinary Not Present- Blood in Urine, Change in Urinary Stream, Frequency, Impotence, Nocturia, Painful Urination, Urgency and Urine Leakage. Musculoskeletal Present- Muscle Weakness. Not Present- Back Pain, Joint Pain, Joint Stiffness, Muscle Pain and Swelling of Extremities. Neurological Present- Trouble walking. Not Present- Decreased Memory, Fainting, Headaches, Numbness, Seizures, Tingling, Tremor and Weakness. Psychiatric Not Present- Anxiety, Bipolar, Change in Sleep Pattern, Depression, Fearful and Frequent crying. Endocrine Not Present- Cold Intolerance, Excessive Hunger, Hair Changes, Heat Intolerance, Hot flashes and New Diabetes. Hematology Not Present- Blood Thinners, Easy Bruising, Excessive bleeding, Gland problems, HIV and Persistent Infections.  Vitals Erline Levine RN; 04/13/2017 1:26 PM) 04/13/2017 1:24 PM Weight: 170 lb Height: 71in Body Surface Area: 1.97 m Body Mass Index: 23.71 kg/m  Temp.: 97.80F(Oral)  Pulse: 66 (Regular)  P.OX: 98% (Room air) BP: 136/68 (Sitting, Left Arm, Standard)       Physical Exam Ralene Ok MD; 04/13/2017 1:38 PM) The physical exam findings are as follows: Note:Constitutional: In wheelchair No acute distress, conversant, appears stated age  Eyes: Anicteric sclerae, moist conjunctiva, no lid lag  Neck: No thyromegaly, trachea midline, no cervical lymphadenopathy  Lungs:  Clear to auscultation biilaterally, normal respiratory effot  Cardiovascular: regular rate & rhythm, no murmurs, no peripheal edema, pedal pulses 2+  GI: Soft, no masses or hepatosplenomegaly, non-tender to palpation  MSK:  In wheelchair, lower extremity weakness, no clubbing cyanosis, edema  Skin: No rashes, palpation reveals normal skin turgor  Psychiatric: Appropriate judgment and insight, oriented to person, place, and time    Assessment & Plan Ralene Ok MD; 04/13/2017 1:39 PM) MUSCLE WEAKNESS (M62.81) Impression: 83 year old male with lower extremity weakness.  1. Will proceed to the operating room for a left lower extremity and left upper extremity muscle biopsy. 2. Discuss the patient risks benefits of the procedure to include but not limited to: Infection, bleeding, damage to structures, delayed wound healing. Patient was understanding and wishes to proceed.

## 2017-05-11 ENCOUNTER — Ambulatory Visit: Payer: Medicare HMO | Admitting: Neurology

## 2017-05-20 DIAGNOSIS — Z96651 Presence of right artificial knee joint: Secondary | ICD-10-CM | POA: Diagnosis not present

## 2017-05-22 DIAGNOSIS — M6281 Muscle weakness (generalized): Secondary | ICD-10-CM | POA: Diagnosis not present

## 2017-05-22 DIAGNOSIS — G729 Myopathy, unspecified: Secondary | ICD-10-CM | POA: Diagnosis not present

## 2017-06-03 ENCOUNTER — Encounter (HOSPITAL_COMMUNITY): Payer: Self-pay

## 2017-06-08 ENCOUNTER — Encounter (HOSPITAL_COMMUNITY): Payer: Self-pay

## 2017-06-15 ENCOUNTER — Ambulatory Visit: Payer: Medicare HMO | Admitting: Neurology

## 2017-06-15 ENCOUNTER — Encounter (INDEPENDENT_AMBULATORY_CARE_PROVIDER_SITE_OTHER): Payer: Self-pay

## 2017-06-15 ENCOUNTER — Encounter: Payer: Self-pay | Admitting: Neurology

## 2017-06-15 VITALS — BP 146/86 | HR 77

## 2017-06-15 DIAGNOSIS — R531 Weakness: Secondary | ICD-10-CM | POA: Diagnosis not present

## 2017-06-15 DIAGNOSIS — G729 Myopathy, unspecified: Secondary | ICD-10-CM

## 2017-06-15 DIAGNOSIS — M6259 Muscle wasting and atrophy, not elsewhere classified, multiple sites: Secondary | ICD-10-CM | POA: Diagnosis not present

## 2017-06-15 NOTE — Progress Notes (Signed)
Subjective:    Patient ID: Cameron Willis is a 83 y.o. male.  HPI     Interim history:   Cameron Willis is an 83 year old right-handed gentleman with an underlying medical history of degenerative disc disease in the cervical spine, and degenerative spine disease of the lumbar spine including lumbar spine stenosis, paroxysmal A. fib, arthritis with status post right total knee replacement in August 2017, who presents for follow-up consultation of his progressive weakness and hyperreflexia. The patient is accompanied by a neighbor (she is a good friend since elementary school days!) today. I last saw him on 03/12/2017, at which time he reported no actual new symptoms. He was normal walking. He was mostly wheelchair-bound. He had no recent falls. We talked about his recent EMG test results. I suggested a muscle biopsy. I suggested referral to rheumatology as well. We talked about potentially sending him to a larger academic center for further evaluation and management such as at [facility] or [facility]. He had interim muscle biopsy after the left thigh as well as comparison biopsy with left deltoid. 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 The discussion involved differential diagnoses of disuse atrophy, steroid myopathy, hypovitaminosis D, hypothyroidism, polymyalgia rheumatica, rapid weight loss and paraneoplastic syndrome. There was apparently no telltale evidence of inflammatory myopathy or dystrophy or rhabdomyolysis. Findings between his left deltoid and left thigh muscle biopsies were similar.  Today, 06/15/2017 (all dictated new, as well as above notes, some dictation done in note pad or Word, outside of chart, may appear as copied):  He reports feeling stable, no better or worse. No new symptoms. No difficulty swallowing or speaking or breathing. Does not drink enough water he admits. Appetite is good. No  falls are reported.  The patient's allergies, current medications, family history, past medical history, past social history, past surgical history and problem list were reviewed and updated as appropriate.   Previously (copied from previous notes for reference):   I first met him on 02/05/2017 at the request of his neurosurgeon, at which time patient reported inability to walk after her recent right total knee replacement within the year. He was noted to have more global weakness and hyperreflexia. I suggested that we proceed with further neuromuscular workup in the form of blood work and EMG and nerve conduction testing. He had prior EMG and nerve conduction testing but a formal report was not available. Blood work from 02/05/2017 showed A1c in the prediabetic range, borderline low vitamin D at 29, positive ANA with positive ENA RNP antibodies, normal muscle enzymes, normal TSH, normal RPR, normal B12, normal ESR.   EMG and nerve conduction testing of mostly the right upper and right lower extremities on 02/26/2017 showed: IMPRESSION:  Abnormal study demonstrating: 1. Severe right peroneal neuropathy, due to absent sensory and motor nerve responses. 2. Abnormal lower extremity motor responses, with corresponding normal sensory responses, can be seen with lumbar radiculopathies (given patient's lumbar spinal stenosis) or motor neuropathy. 3. 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 Myotonic discharges noted in cervical paraspinal muscles. These findings can be seen in multiple conditions including myotonic dystrophies, muscle channelopathies, inflammatory myopathies or other conditions. 4. Abnormal sensory and motor nerve responses in right upper extremity may be due to polyneuropathy or plexopathy, but not better localized with needle EMG.   I suggested we talked about further workup.     02/05/2017: (He)  reports progressive weakness. He has  seen Dr. Letta Pate recently. He had a cervical spine MRI on 12/03/2016 without contrast which I reviewed: IMPRESSION: 1. Multilevel cervical spondylosis greatest at the C4-5 level where a right central disc protrusion impinges the right anterior cord with cord flattening. 2. Mild C4-5 and C5-6 canal stenosis.  No high-grade canal stenosis. 3. Extensive uncovertebral and facet hypertrophy with multiple levels of advanced foraminal narrowing bilaterally. 4. Right-sided C3-4 small facet effusion and mild facet edema, likely degenerative.   He subsequently had a thoracic spine MRI without contrast through your office on 12/26/2016 and I reviewed the results: Impression: No abnormality of the thoracic spinal cord. No spinal canal or neural foraminal stenosis.   You ordered a lumbar spine MRI and EMG and nerve conduction test. He was noted to have hyperreflexia, he has had neck pain. He has had low back pain. I reviewed your office note from 01/05/2017, which you kindly included. Prior to that, you saw him on 12/09/2016.   Unfortunately, we did not have any test results on the lumbar spine MRI and no test results on the EMG and nerve conduction test. We called her office several times and were able to get the last office note from 01/26/2017 at which time we discussed his EMG results and lumbar spine MRI results. We did receive a copy of the lumbar spine MRI report from his test on 01/13/2017. Impression: Degenerative change of the lumbar spine superimposed on borderline congenital canal narrowing. Severe canal stenosis at L3-4, mild at L1-2 and L2-3. Multilevel neural foraminal narrowing: Moderate to severe on the right at L4-5. Bilateral L3-4 facet arthropathy with acute reactive changes. He had EMG and nerve conduction testing of both lower extremities on 01/14/2017 under Dr. Brien Few. A formal typed report is not available for my review today. We've requested the report from your office but were told that  there is none at this time.   The patient reports that he has had weakness and muscle atrophy in his upper extremities for the past few years, perhaps 7 years, with gradual worsening. His major complaint is the inability to walk since his right total knee replacement surgery in August 2017. He had arthroscopic surgery a few weeks after as I understand. This was under Dr. Lorin Mercy. He has also seen Dr. Lorin Mercy' PA in follow-up. He has not recently fallen. He has had tightening of his tendons of both hamstrings. He reports no significant pain at this time. He is essentially wheelchair bound. He had physical therapy but did not make any progress as I understand. He does not report any difficulty swallowing or breathing.   He quit smoking over 10 years ago. He is retired. He lives with his wife. They have one son. He worked as a Dealer and also on a farm. He reports that he also may have had a hip MRI through your office. Results are not available for my review on that test.  His Past Medical History Is Significant For: No past medical history on file.  Her Past Surgical History Is Significant For: No past surgical history on file.  His Family History Is Significant For: No family history on file.  His Social History Is Significant For: Social History   Socioeconomic History  . Marital status: Married    Spouse name: None  . Number of children: None  . Years of education: None  . Highest education level: None  Social Needs  . Financial resource strain: None  .  Food insecurity - worry: None  . Food insecurity - inability: None  . Transportation needs - medical: None  . Transportation needs - non-medical: None  Occupational History  . None  Tobacco Use  . Smoking status: Former Research scientist (life sciences)  . Smokeless tobacco: Never Used  Substance and Sexual Activity  . Alcohol use: None  . Drug use: None  . Sexual activity: None  Other Topics Concern  . None  Social History Narrative  . None    His  Allergies Are:  No Known Allergies:   His Current Medications Are:  No outpatient encounter medications on file as of 06/15/2017.   No facility-administered encounter medications on file as of 06/15/2017.   :  Review of Systems:  Out of a complete 14 point review of systems, all are reviewed and negative with the exception of these symptoms as listed below: Review of Systems  Neurological:       Pt presents today to follow up. Pt is still unable to walk.    Objective:  Neurological Exam  Physical Exam Physical Examination:   Vitals:   06/15/17 1302  BP: (!) 146/86  Pulse: 77    General Examination: The patient is a very pleasant 83 y.o. male in no acute distress. He appears deconditioned. In [place]. Good spirits.   HEENT:Normocephalic, atraumatic, pupils are equal, round and reactive to light and accommodation. Extraocular tracking is good without limitation to gaze excursion or nystagmus noted. Normal smooth pursuit is noted. Hearing is grossly intact. Face is symmetric with normal facial animation and normal facial sensation. Speech is clear with no dysarthria noted. There is no hypophonia. There is no lip, neck/head, jaw or voice tremor. Neck shows mild decrease in range of motion. Airway examination reveals mild mouth dryness, adequate dental hygiene, mild airway crowding. Tongue protrudes centrally and palate elevates symmetrically.   Chest:Clear to auscultation without wheezing, rhonchi or crackles noted.  Heart:S1+S2+0, regular and normal without murmurs, rubs or gallops noted.   Abdomen:Soft, non-tender and non-distended with normal bowel sounds appreciated on auscultation.  Extremities:There is nopitting edema in the distal lower extremities bilaterally. Pedal pulses are intact.  Skin: Warm and dry without trophic changes noted.  Musculoskeletal: exam revealsno changes. He has global muscle wasting, particularly noticeable in both biceps areas and also both  thigh areas, mild tightness in the hamstrings, more on R, wasting of thenar and hyperthenar eminences. Fifth digit curled in bilaterally.  Neurologically:  Mental status: The patient is awake, alert and oriented in all 4 spheres. Hisimmediate and remote memory, attention, language skills and fund of knowledge are appropriate. There is no evidence of aphasia, agnosia, apraxia or anomia. Speech is clear with normal prosody and enunciation. Thought process is linear. Mood is normaland affect is normal.  Cranial nerves II - XII are as described above under HEENT exam.  Motor exam: Thin bulk, global strength of 4 out of 5, weaker in both hip flexors, also weaker in the right foot dorsiflexion, all stable. Grip strength weakness noted, inability to fully open both hands. No resting tremor. Romberg is not testable. Reflexes are about 2+ in both upper extremities, 3+ in both knees, trace in both ankles. Fine motor skills are globally moderately impaired in both hands, mildly impaired in the left lower extremity, moderately impaired in the right lower extremity, heel to shin is not possible. No obvious fasciculations noted, no obvious dysmetria or intention tremor but he is unable to do heel to shin.  Sensory exam: intact to  light touch. Unable to stand unsupported and unable to walk.   Assessment and Plan:  In summary, EBERT FORRESTER a very pleasant 83 year old male with an underlying medical history of degenerative disc disease in the cervical spine,anddegenerative spine disease of the lumbar spine including lumbar spine stenosis,paroxysmal A. fib, arthritis with status post right total knee replacement in August 2017, who presents forFollow-up consultation of his progressive weakness, also hyperreflexia and inability to walk since his knee replacement surgery last year. We did additional testing since our first visit in July 2018 in the form of EMG nerve conduction testing and blood work. He does have a  positive ANA and ENA RNP antibodies and EMG, nerve conduction testing in late July was concerning for a more widespread myopathic disorder, differential diagnosis given his history and age could be a rare form of muscular dystrophy versus inclusion body myositis. He had left deltoid and left thigh muscle biopsies in October 2018 with nonspecific results, in keeping with mild to moderate type II myofibers atrophy without myofibers necrosis or inflammation. So far, and actual diagnosis has not been established. I explained this to the patient and his friend today. He had a borderline low vitamin D back in July and I suggested that he started over-the-counter vitamin D supplement. He has not done this yet. I suggested he start vitamin D by mouth over-the-counter. Prior testing was in the form of EMG nerve conduction testing to his neurosurgeon as well as cervical, thoracic and lumbar spine MRIs. His situation is complicated by recent surgery, probably resulting also in peroneal neuropathy, and he has degenerative spine disease with lumbar spine stenosis. He is advised to continue strengthening exercises, eat right with a well balanced diet, hydrate better with water. I suggested we could consider a second opinion with one of her neuromuscular specialists in the group, most likely with Dr. Leta Baptist, who also did the EMG test in July. He is not keen on seeing anymore doctors at this time and would like to think about it. I will run this case by my colleague and see if he has any other input and if a second opinion would be feasible. At this juncture, we mutually agreed for an as needed follow-up with me. I answered all their questions today and the patient and his friend were in agreement. I spent 25 minutes in total face-to-face time with the patient, more than 50% of which was spent in counseling and coordination of care, reviewing test results, reviewing medication and discussing or reviewing the diagnosis of global  weakness, its prognosis and treatment options. Pertinent laboratory and imaging test results that were available during this visit with the patient were reviewed by me and considered in my medical decision making (see chart for details).

## 2017-06-15 NOTE — Patient Instructions (Addendum)
Your muscle biopsy from the left thigh and left shoulder showed non-specific findings, in keeping with severe loss of skeletal muscle fibers and no signs of immunological process or inflammation.  I would suggest I run your case by one of our neuromuscular specialist, likely Dr. Leta Baptist, who did your nerve and muscle electrical test back in July. If he has some other advice he could offer, you may benefit from a second opinion with him.  Your vitamin D level was borderline low at 29 (normal range is around 30-100). I would recommend that patient start an OTC Vitamin D supplement: 1000-2000 units daily of any vitamin D supplement of your choice should be fine. I would recommend recheck of vitamin D status in 3-6 months with PCP.  I can see you back as needed at this point.

## 2017-10-13 DIAGNOSIS — I2510 Atherosclerotic heart disease of native coronary artery without angina pectoris: Secondary | ICD-10-CM | POA: Diagnosis not present

## 2017-10-13 DIAGNOSIS — J09X2 Influenza due to identified novel influenza A virus with other respiratory manifestations: Secondary | ICD-10-CM | POA: Diagnosis not present

## 2017-10-13 DIAGNOSIS — I10 Essential (primary) hypertension: Secondary | ICD-10-CM | POA: Diagnosis not present

## 2017-10-13 DIAGNOSIS — Z87442 Personal history of urinary calculi: Secondary | ICD-10-CM | POA: Diagnosis not present

## 2017-10-13 DIAGNOSIS — J029 Acute pharyngitis, unspecified: Secondary | ICD-10-CM | POA: Diagnosis not present

## 2017-10-13 DIAGNOSIS — R531 Weakness: Secondary | ICD-10-CM | POA: Diagnosis not present

## 2017-10-13 DIAGNOSIS — I4891 Unspecified atrial fibrillation: Secondary | ICD-10-CM | POA: Diagnosis not present

## 2017-10-13 DIAGNOSIS — R7989 Other specified abnormal findings of blood chemistry: Secondary | ICD-10-CM | POA: Diagnosis not present

## 2017-10-13 DIAGNOSIS — Z888 Allergy status to other drugs, medicaments and biological substances status: Secondary | ICD-10-CM | POA: Diagnosis not present

## 2017-10-13 DIAGNOSIS — Z951 Presence of aortocoronary bypass graft: Secondary | ICD-10-CM | POA: Diagnosis not present

## 2017-10-13 DIAGNOSIS — K219 Gastro-esophageal reflux disease without esophagitis: Secondary | ICD-10-CM | POA: Diagnosis not present

## 2017-10-13 DIAGNOSIS — R55 Syncope and collapse: Secondary | ICD-10-CM | POA: Diagnosis not present

## 2017-10-13 DIAGNOSIS — J101 Influenza due to other identified influenza virus with other respiratory manifestations: Secondary | ICD-10-CM | POA: Diagnosis not present

## 2017-10-13 DIAGNOSIS — E871 Hypo-osmolality and hyponatremia: Secondary | ICD-10-CM | POA: Diagnosis not present

## 2017-10-14 DIAGNOSIS — J029 Acute pharyngitis, unspecified: Secondary | ICD-10-CM | POA: Diagnosis not present

## 2017-10-14 DIAGNOSIS — R55 Syncope and collapse: Secondary | ICD-10-CM | POA: Diagnosis not present

## 2017-10-14 DIAGNOSIS — I2510 Atherosclerotic heart disease of native coronary artery without angina pectoris: Secondary | ICD-10-CM | POA: Diagnosis not present

## 2017-10-14 DIAGNOSIS — E871 Hypo-osmolality and hyponatremia: Secondary | ICD-10-CM | POA: Diagnosis not present

## 2017-10-14 DIAGNOSIS — J101 Influenza due to other identified influenza virus with other respiratory manifestations: Secondary | ICD-10-CM | POA: Diagnosis not present

## 2017-10-14 DIAGNOSIS — R269 Unspecified abnormalities of gait and mobility: Secondary | ICD-10-CM

## 2017-10-14 DIAGNOSIS — I4891 Unspecified atrial fibrillation: Secondary | ICD-10-CM | POA: Diagnosis not present

## 2017-10-14 DIAGNOSIS — I10 Essential (primary) hypertension: Secondary | ICD-10-CM | POA: Diagnosis not present

## 2017-10-14 DIAGNOSIS — K219 Gastro-esophageal reflux disease without esophagitis: Secondary | ICD-10-CM | POA: Diagnosis not present

## 2017-10-14 HISTORY — DX: Unspecified abnormalities of gait and mobility: R26.9

## 2017-10-21 DIAGNOSIS — I4891 Unspecified atrial fibrillation: Secondary | ICD-10-CM | POA: Diagnosis not present

## 2017-10-23 NOTE — Progress Notes (Deleted)
Cardiology Office Note:    Date:  10/23/2017   ID:  Cameron Willis, DOB [birth year], MRN [id number]  PCP:  Townsend Roger, MD  Cardiologist:  Shirlee More, MD    Referring MD: Nona Dell, Corene Cornea, MD    ASSESSMENT:    No diagnosis found. PLAN:    In order of problems listed above:  1. ***   Next appointment: ***   Medication Adjustments/Labs and Tests Ordered: Current medicines are reviewed at length with the patient today.  Concerns regarding medicines are outlined above.  No orders of the defined types were placed in this encounter.  No orders of the defined types were placed in this encounter.   No chief complaint on file.   History of Present Illness:    Cameron Willis is a 84 y.o. male with a hx of CAD and PAF last seen 03/25/16. He had MI and PCI with stent 2007 and MI in may 2013 He also had a cardiac catheterization in 05/13 which revealed an 80% RCA lesion as well as a significant left main lesion with a 70% mid LAD lesion as well as a 70% OM1 lesion.On 03/25/2012 the patinet underwent median sternotomy,coronary artery bypass grafting times three with LIMA to LAD, saphenous vein graft to the OM, saphenous vein graft to distal right coronary artery per Laveda Norman, MD .  He had a recent [facility] admission 10/13/17 with Influenza and PAF with Mobitz 1 second degree AVB and bradycardia with a beta blocker. Compliance with diet, lifestyle and medications: *** Past Medical History:  Diagnosis Date  . Cervical disc disorder at C4-C5 level with myelopathy 12/05/2016  . Contracture of right knee 12/05/2016  . COPD (chronic obstructive pulmonary disease) ([place]) 03/22/2012  . Coronary artery disease involving native coronary artery of native heart without angina pectoris 03/22/2012   Overview:  Coronary artery bypass graft August 2013 LIMA to LAD and SVG to RCA and SVG to obtuse marginal  . Dyslipidemia 03/26/2015  . Gait disturbance 10/14/2017  . GERD (gastroesophageal reflux disease)  03/22/2012  . HTN (hypertension) 03/22/2012  . MI, old 03/22/2012  . Paroxysmal atrial fibrillation ([place]) 03/26/2015   Overview:  Chads score 2, was anticoagulated but developed tarry stool optic admission was withdrawn for now scheduled to see gastroenterologist  . Pre-op evaluation 03/25/2016  . S/P CABG x 3 03/25/2012  . Tobacco abuse 03/22/2012   Overview:  Quit 2 years ago    Past Surgical History:  Procedure Laterality Date  . CARDIAC CATHETERIZATION    . CORONARY ARTERY BYPASS GRAFT      Current Medications: No outpatient medications have been marked as taking for the 10/26/17 encounter (Appointment) with Richardo Priest, MD.     Allergies:   Aciphex [rabeprazole sodium] and Omeprazole   Social History   Socioeconomic History  . Marital status: Married    Spouse name: Not on file  . Number of children: Not on file  . Years of education: Not on file  . Highest education level: Not on file  Occupational History  . Not on file  Social Needs  . Financial resource strain: Not on file  . Food insecurity:    Worry: Not on file    Inability: Not on file  . Transportation needs:    Medical: Not on file    Non-medical: Not on file  Tobacco Use  . Smoking status: Former Research scientist (life sciences)  . Smokeless tobacco: Never Used  Substance and Sexual Activity  .  Alcohol use: No    Frequency: Never  . Drug use: No  . Sexual activity: Not on file  Lifestyle  . Physical activity:    Days per week: Not on file    Minutes per session: Not on file  . Stress: Not on file  Relationships  . Social connections:    Talks on phone: Not on file    Gets together: Not on file    Attends religious service: Not on file    Active member of club or organization: Not on file    Attends meetings of clubs or organizations: Not on file    Relationship status: Not on file  Other Topics Concern  . Not on file  Social History Narrative  . Not on file     Family History: The patient's ***family history  includes Cancer in his father. ROS:   Please see the history of present illness.    All other systems reviewed and are negative.  EKGs/Labs/Other Studies Reviewed:    The following studies were reviewed today:  EKG:  EKG ordered today.  The ekg ordered today demonstrates *** Echo 10/13/17: EF 55-60% normal LA RA size Recent Labs: 10/13/17 CBC CMProponin normal Pro BNP 2630 02/05/2017: TSH 1.770  Recent Lipid Panel No results found for: CHOL, TRIG, HDL, CHOLHDL, VLDL, LDLCALC, LDLDIRECT  Physical Exam:    VS:  There were no vitals taken for this visit.    Wt Readings from Last 3 Encounters:  No data found for Wt     GEN: *** Well nourished, well developed in no acute distress HEENT: Normal NECK: No JVD; No carotid bruits LYMPHATICS: No lymphadenopathy CARDIAC: ***RRR, no murmurs, rubs, gallops RESPIRATORY:  Clear to auscultation without rales, wheezing or rhonchi  ABDOMEN: Soft, non-tender, non-distended MUSCULOSKELETAL:  No edema; No deformity  SKIN: Warm and dry NEUROLOGIC:  Alert and oriented x 3 PSYCHIATRIC:  Normal affect    Signed, Shirlee More, MD  10/23/2017 2:09 PM    [place]

## 2017-10-26 ENCOUNTER — Ambulatory Visit: Payer: Medicare HMO | Admitting: Cardiology

## 2018-01-13 DIAGNOSIS — I4891 Unspecified atrial fibrillation: Secondary | ICD-10-CM | POA: Diagnosis not present

## 2018-01-13 DIAGNOSIS — K625 Hemorrhage of anus and rectum: Secondary | ICD-10-CM | POA: Diagnosis not present

## 2018-01-20 ENCOUNTER — Ambulatory Visit: Payer: Medicare HMO | Admitting: Cardiology

## 2018-01-20 ENCOUNTER — Encounter: Payer: Self-pay | Admitting: Cardiology

## 2018-01-20 ENCOUNTER — Encounter: Payer: Self-pay | Admitting: *Deleted

## 2018-01-20 VITALS — BP 106/64 | HR 58 | Ht 71.0 in | Wt 169.0 lb

## 2018-01-20 DIAGNOSIS — I480 Paroxysmal atrial fibrillation: Secondary | ICD-10-CM

## 2018-01-20 DIAGNOSIS — Z7901 Long term (current) use of anticoagulants: Secondary | ICD-10-CM | POA: Diagnosis not present

## 2018-01-20 DIAGNOSIS — Z951 Presence of aortocoronary bypass graft: Secondary | ICD-10-CM

## 2018-01-20 DIAGNOSIS — I2510 Atherosclerotic heart disease of native coronary artery without angina pectoris: Secondary | ICD-10-CM

## 2018-01-20 DIAGNOSIS — I4819 Other persistent atrial fibrillation: Secondary | ICD-10-CM | POA: Insufficient documentation

## 2018-01-20 DIAGNOSIS — R001 Bradycardia, unspecified: Secondary | ICD-10-CM | POA: Diagnosis not present

## 2018-01-20 DIAGNOSIS — I481 Persistent atrial fibrillation: Secondary | ICD-10-CM | POA: Diagnosis not present

## 2018-01-20 HISTORY — DX: Bradycardia, unspecified: R00.1

## 2018-01-20 HISTORY — DX: Other persistent atrial fibrillation: I48.19

## 2018-01-20 MED ORDER — ELIQUIS 5 MG PO TABS: 5.0000 mg | ORAL_TABLET | Freq: Two times a day (BID) | ORAL | 3 refills | Status: DC

## 2018-01-20 NOTE — Patient Instructions (Signed)
Medication Instructions:  Your physician has recommended you make the following change in your medication:  INCREASE eliquis 5 mg twice daily  Labwork: Your physician recommends that you have the following labs drawn: CBC, BMP, stool sample.   Testing/Procedures: You had an EKG today.   Your physician has recommended that you wear a holter monitor. Holter monitors are medical devices that record the heart's electrical activity. Doctors most often use these monitors to diagnose arrhythmias. Arrhythmias are problems with the speed or rhythm of the heartbeat. The monitor is a small, portable device. You can wear one while you do your normal daily activities. This is usually used to diagnose what is causing palpitations/syncope (passing out). Wear for 24 hours.  Follow-Up: Your physician recommends that you schedule a follow-up appointment in: 1 month.   If you need a refill on your cardiac medications before your next appointment, please call your pharmacy.   Thank you for choosing [facility]! Robyne Peers, RN [phone]

## 2018-01-20 NOTE — Progress Notes (Signed)
Cardiology Office Note:    Date:  01/20/2018   ID:  [name], DOB 06/13/1934, MRN [id number]  PCP:  Townsend Roger, MD  Cardiologist:  Jenne Campus, MD    Referring MD: Nona Dell, Corene Cornea, MD   Chief Complaint  Patient presents with  . Atrial Fibrillation  I have atrial fibrillation  History of Present Illness:    Cameron Willis is a 84 y.o. male with coronary artery disease, status post coronary artery bypass graft done many years ago.  He also does have some chronic muscle disease to prevent him from ambulating.  Recently he ended up going to the emergency room he was fine to have a flu at the same time his EKG showed atrial fibrillation.  He was initiated with anticoagulation.  Recently he ended up seeing his primary care physician.  He was complaining of having tarry stool.  However he was not anemic.  Interestingly since that time he is taking only 5 mg of Eliquis once a day.  Denies having any cardiac issues there is no shortness of breath chest pain tightness squeezing pressure burning chest.  Because of chronic back problem and muscle weakness he is not able to ambulate.  Past Medical History:  Diagnosis Date  . Cervical disc disorder at C4-C5 level with myelopathy 12/05/2016  . Contracture of right knee 12/05/2016  . COPD (chronic obstructive pulmonary disease) ([place]) 03/22/2012  . Coronary artery disease involving native coronary artery of native heart without angina pectoris 03/22/2012   Overview:  Coronary artery bypass graft August 2013 LIMA to LAD and SVG to RCA and SVG to obtuse marginal  . Dyslipidemia 03/26/2015  . Gait disturbance 10/14/2017  . GERD (gastroesophageal reflux disease) 03/22/2012  . HTN (hypertension) 03/22/2012  . MI, old 03/22/2012  . Paroxysmal atrial fibrillation ([place]) 03/26/2015   Overview:  Chads score 2, was anticoagulated but developed tarry stool optic admission was withdrawn for now scheduled to see gastroenterologist  . Pre-op evaluation 03/25/2016    . S/P CABG x 3 03/25/2012  . Tobacco abuse 03/22/2012   Overview:  Quit 2 years ago    Past Surgical History:  Procedure Laterality Date  . CARDIAC CATHETERIZATION    . CORONARY ARTERY BYPASS GRAFT      Current Medications: Current Meds  Medication Sig  . ELIQUIS 5 MG TABS tablet Take 1 tablet by mouth daily.  . meclizine (ANTIVERT) 12.5 MG tablet Take 1 tablet by mouth as needed for dizziness.  . nitroGLYCERIN (NITROSTAT) 0.4 MG SL tablet Place 1 tablet under the tongue as needed for chest pain.     Allergies:   Aciphex [rabeprazole sodium] and Omeprazole   Social History   Socioeconomic History  . Marital status: Married    Spouse name: Not on file  . Number of children: Not on file  . Years of education: Not on file  . Highest education level: Not on file  Occupational History  . Not on file  Social Needs  . Financial resource strain: Not on file  . Food insecurity:    Worry: Not on file    Inability: Not on file  . Transportation needs:    Medical: Not on file    Non-medical: Not on file  Tobacco Use  . Smoking status: Former Research scientist (life sciences)  . Smokeless tobacco: Never Used  Substance and Sexual Activity  . Alcohol use: No    Frequency: Never  . Drug use: No  . Sexual activity: Not on file  Lifestyle  . Physical activity:    Days per week: Not on file    Minutes per session: Not on file  . Stress: Not on file  Relationships  . Social connections:    Talks on phone: Not on file    Gets together: Not on file    Attends religious service: Not on file    Active member of club or organization: Not on file    Attends meetings of clubs or organizations: Not on file    Relationship status: Not on file  Other Topics Concern  . Not on file  Social History Narrative  . Not on file     Family History: The patient's family history includes Cancer in his father. ROS:   Please see the history of present illness.    All 14 point review of systems negative except as  described per history of present illness  EKGs/Labs/Other Studies Reviewed:      Recent Labs: 02/05/2017: TSH 1.770  Recent Lipid Panel No results found for: CHOL, TRIG, HDL, CHOLHDL, VLDL, LDLCALC, LDLDIRECT  Physical Exam:    VS:  BP 106/64   Pulse (!) 58   Ht 5\' 11"  (1.803 m)   Wt 169 lb (76.7 kg)   SpO2 94%   BMI 23.57 kg/m     Wt Readings from Last 3 Encounters:  01/20/18 169 lb (76.7 kg)     GEN:  Well nourished, well developed in no acute distress HEENT: Normal NECK: No JVD; No carotid bruits LYMPHATICS: No lymphadenopathy CARDIAC: RRR, no murmurs, no rubs, no gallops RESPIRATORY:  Clear to auscultation without rales, wheezing or rhonchi  ABDOMEN: Soft, non-tender, non-distended MUSCULOSKELETAL:  No edema; No deformity  SKIN: Warm and dry LOWER EXTREMITIES: no swelling NEUROLOGIC:  Alert and oriented x 3 PSYCHIATRIC:  Normal affect   ASSESSMENT:    1. Coronary artery disease involving native coronary artery of native heart without angina pectoris   2. Persistent atrial fibrillation ([place])   3. S/P CABG x 3   4. Bradycardia by electrocardiogram    PLAN:    In order of problems listed above:  1. Coronary artery disease: Asymptomatic doing well.  He is not on aspirin he is not on statin there is no indication for aspirin his clinical scenario since he is taking Eliquis.  However statin will be beneficial.  I will contact his primary care physician to see if you have any recent fasting lipid profile done. 2. Persistent atrial fibrillation.  Unknown duration.  He needs to be anticoagulated.  His chads 2 Vascor equals 3.  He been taking Eliquis and appropriate weight I will increase the dose to 5 mg twice daily.  I will ask him to have CBC done today as well as we will check his stool for guaiac.  We initiated conversation about potentially converting to sinus rhythm however he is completely asymptomatic with this arrhythmia.  With his sedentary lifestyle we may end up  just simply monitoring him.  I did review his echocardiogram from the hospital which showed normal left ventricular ejection fraction with some apical hypertrophy. 3. Status post coronary artery bypass graft doing well from that point review. 4. Bradycardia.  He does have atrial fibrillation with slow ventricular rate clearly there is some AV nodal dysfunction.  I will ask him to wear Holter monitor for 24 hours to see if there is significant slowing of his heart rate today during my interview his heart rate was 52.  He denies  having any syncope or dizziness.  See him back in my office in about 4 weeks.   Medication Adjustments/Labs and Tests Ordered: Current medicines are reviewed at length with the patient today.  Concerns regarding medicines are outlined above.  No orders of the defined types were placed in this encounter.  Medication changes: No orders of the defined types were placed in this encounter.   Signed, Park Liter, MD, [facility] 01/20/2018 2:58 PM    [place]

## 2018-01-21 LAB — BASIC METABOLIC PANEL
BUN / CREAT RATIO: 13 (ref 10–24)
BUN: 15 mg/dL (ref 8–27)
CALCIUM: 9.7 mg/dL (ref 8.6–10.2)
CHLORIDE: 102 mmol/L (ref 96–106)
CO2: 22 mmol/L (ref 20–29)
Creatinine, Ser: 1.18 mg/dL (ref 0.76–1.27)
GFR calc Af Amer: 66 mL/min/{1.73_m2} (ref >59)
GFR calc non Af Amer: 57 mL/min/{1.73_m2} — ABNORMAL LOW (ref >59)
GLUCOSE: 86 mg/dL (ref 65–99)
Potassium: 4.8 mmol/L (ref 3.5–5.2)
Sodium: 139 mmol/L (ref 134–144)

## 2018-01-21 LAB — CBC
Hematocrit: 43 % (ref 37.5–51.0)
Hemoglobin: 15 g/dL (ref 13.0–17.7)
MCH: 32 pg (ref 26.6–33.0)
MCHC: 34.9 g/dL (ref 31.5–35.7)
MCV: 92 fL (ref 79–97)
Platelets: 242 10*3/uL (ref 150–450)
RBC: 4.69 x10E6/uL (ref 4.14–5.80)
RDW: 13.1 % (ref 12.3–15.4)
WBC: 8 10*3/uL (ref 3.4–10.8)

## 2018-01-29 DIAGNOSIS — I10 Essential (primary) hypertension: Secondary | ICD-10-CM | POA: Diagnosis not present

## 2018-02-01 ENCOUNTER — Ambulatory Visit (INDEPENDENT_AMBULATORY_CARE_PROVIDER_SITE_OTHER): Payer: Medicare HMO

## 2018-02-01 DIAGNOSIS — I481 Persistent atrial fibrillation: Secondary | ICD-10-CM

## 2018-02-01 DIAGNOSIS — R001 Bradycardia, unspecified: Secondary | ICD-10-CM

## 2018-02-19 ENCOUNTER — Ambulatory Visit: Payer: Medicare HMO | Admitting: Cardiology

## 2018-03-01 ENCOUNTER — Encounter: Payer: Self-pay | Admitting: Cardiology

## 2018-03-01 ENCOUNTER — Ambulatory Visit (INDEPENDENT_AMBULATORY_CARE_PROVIDER_SITE_OTHER): Payer: Medicare HMO | Admitting: Cardiology

## 2018-03-01 VITALS — BP 150/70 | HR 61 | Ht 71.0 in | Wt 169.0 lb

## 2018-03-01 DIAGNOSIS — I2510 Atherosclerotic heart disease of native coronary artery without angina pectoris: Secondary | ICD-10-CM

## 2018-03-01 DIAGNOSIS — I10 Essential (primary) hypertension: Secondary | ICD-10-CM | POA: Diagnosis not present

## 2018-03-01 DIAGNOSIS — R001 Bradycardia, unspecified: Secondary | ICD-10-CM | POA: Diagnosis not present

## 2018-03-01 DIAGNOSIS — Z951 Presence of aortocoronary bypass graft: Secondary | ICD-10-CM

## 2018-03-01 DIAGNOSIS — I482 Chronic atrial fibrillation, unspecified: Secondary | ICD-10-CM | POA: Insufficient documentation

## 2018-03-01 DIAGNOSIS — E785 Hyperlipidemia, unspecified: Secondary | ICD-10-CM | POA: Diagnosis not present

## 2018-03-01 HISTORY — DX: Chronic atrial fibrillation, unspecified: I48.20

## 2018-03-01 MED ORDER — PRAVASTATIN SODIUM 40 MG PO TABS: 40.0000 mg | ORAL_TABLET | Freq: Every evening | ORAL | 2 refills | Status: DC

## 2018-03-01 NOTE — Patient Instructions (Addendum)
Medication Instructions:  Your physician has recommended you make the following change in your medication:  START 40 mg pravastatin daily  Labwork: None  Testing/Procedures: None  Follow-Up: Your physician recommends that you schedule a follow-up appointment in: 4 months  Any Other Special Instructions Will Be Listed Below (If Applicable).     If you need a refill on your cardiac medications before your next appointment, please call your pharmacy.   [place], RN, BSN  Pravastatin tablets What is this medicine? PRAVASTATIN (PRA va stat in) is known as a HMG-CoA reductase inhibitor or 'statin'. It lowers the level of cholesterol and triglycerides in the blood. This drug may also reduce the risk of heart attack, stroke, or other health problems in patients with risk factors for heart disease. Diet and lifestyle changes are often used with this drug. This medicine may be used for other purposes; ask your health care provider or pharmacist if you have questions. COMMON BRAND NAME(S): Pravachol What should I tell my health care provider before I take this medicine? They need to know if you have any of these conditions: -frequently drink alcoholic beverages -kidney disease -liver disease -muscle aches or weakness -other medical condition -an unusual or allergic reaction to pravastatin, other medicines, foods, dyes, or preservatives -pregnant or trying to get pregnant -breast-feeding How should I use this medicine? Take pravastatin tablets by mouth. Swallow the tablets with a drink of water. Pravastatin can be taken at anytime of the day, with or without food. Follow the directions on the prescription label. Take your doses at regular intervals. Do not take your medicine more often than directed. Talk to your pediatrician regarding the use of this medicine in children. Special care may be needed. Pravastatin has been used in children as young as 67 years of  age. Overdosage: If you think you have taken too much of this medicine contact a poison control center or emergency room at once. NOTE: This medicine is only for you. Do not share this medicine with others. What if I miss a dose? If you miss a dose, take it as soon as you can. If it is almost time for your next dose, take only that dose. Do not take double or extra doses. What may interact with this medicine? This medicine may interact with the following medications: -colchicine -cyclosporine -other medicines for high cholesterol -some antibiotics like azithromycin, clarithromycin, erythromycin, and telithromycin This list may not describe all possible interactions. Give your health care provider a list of all the medicines, herbs, non-prescription drugs, or dietary supplements you use. Also tell them if you smoke, drink alcohol, or use illegal drugs. Some items may interact with your medicine. What should I watch for while using this medicine? Visit your doctor or health care professional for regular check-ups. You may need regular tests to make sure your liver is working properly. Tell your doctor or health care professional right away if you get any unexplained muscle pain, tenderness, or weakness, especially if you also have a fever and tiredness. Your doctor or health care professional may tell you to stop taking this medicine if you develop muscle problems. If your muscle problems do not go away after stopping this medicine, contact your health care professional. This drug is only part of a total heart-health program. Your doctor or a dietician can suggest a low-cholesterol and low-fat diet to help. Avoid alcohol and smoking, and keep a proper exercise schedule. Do not use this drug if  you are pregnant or breast-feeding. Serious side effects to an unborn child or to an infant are possible. Talk to your doctor or pharmacist for more information. This medicine may affect blood sugar levels. If  you have diabetes, check with your doctor or health care professional before you change your diet or the dose of your diabetic medicine. If you are going to have surgery tell your health care professional that you are taking this drug. What side effects may I notice from receiving this medicine? Side effects that you should report to your doctor or health care professional as soon as possible: -allergic reactions like skin rash, itching or hives, swelling of the face, lips, or tongue -dark urine -fever -muscle pain, cramps, or weakness -redness, blistering, peeling or loosening of the skin, including inside the mouth -trouble passing urine or change in the amount of urine -unusually weak or tired -yellowing of the eyes or skin Side effects that usually do not require medical attention (report to your doctor or health care professional if they continue or are bothersome): -gas -headache -heartburn -indigestion -stomach pain This list may not describe all possible side effects. Call your doctor for medical advice about side effects. You may report side effects to FDA at 1-800-FDA-1088. Where should I keep my medicine? Keep out of the reach of children. Store at room temperature between 15 to 30 degrees C (59 to 86 degrees F). Protect from light. Keep container tightly closed. Throw away any unused medicine after the expiration date. NOTE: This sheet is a summary. It may not cover all possible information. If you have questions about this medicine, talk to your doctor, pharmacist, or health care provider.  2018 Elsevier/Gold Standard (2015-02-15 16:00:27)

## 2018-03-01 NOTE — Progress Notes (Signed)
Cardiology Office Note:    Date:  03/01/2018   ID:  Cameron Willis, DOB 24-Jul-1934, MRN [id number]  PCP:  Townsend Roger, MD  Cardiologist:  Jenne Campus, MD    Referring MD: Nona Dell, Corene Cornea, MD   Chief Complaint  Patient presents with  . Follow-up  Doing well  History of Present Illness:    Cameron Willis is a 84 y.o. male with coronary artery disease status post coronary artery bypass graft, dyslipidemia, recently recognized atrial fibrillation comes to me for follow-up overall he is doing great asymptomatic sits on the chair all the time.  Denies have any palpitation dizziness or passing out.  Also another complaint was bradycardia he did wear Holter monitor there is no critical bradycardia no dizziness no passing out.  Past Medical History:  Diagnosis Date  . Cervical disc disorder at C4-C5 level with myelopathy 12/05/2016  . Contracture of right knee 12/05/2016  . COPD (chronic obstructive pulmonary disease) ([place]) 03/22/2012  . Coronary artery disease involving native coronary artery of native heart without angina pectoris 03/22/2012   Overview:  Coronary artery bypass graft August 2013 LIMA to LAD and SVG to RCA and SVG to obtuse marginal  . Dyslipidemia 03/26/2015  . Gait disturbance 10/14/2017  . GERD (gastroesophageal reflux disease) 03/22/2012  . HTN (hypertension) 03/22/2012  . MI, old 03/22/2012  . Paroxysmal atrial fibrillation ([place]) 03/26/2015   Overview:  Chads score 2, was anticoagulated but developed tarry stool optic admission was withdrawn for now scheduled to see gastroenterologist  . Pre-op evaluation 03/25/2016  . S/P CABG x 3 03/25/2012  . Tobacco abuse 03/22/2012   Overview:  Quit 2 years ago    Past Surgical History:  Procedure Laterality Date  . CARDIAC CATHETERIZATION    . CORONARY ARTERY BYPASS GRAFT      Current Medications: Current Meds  Medication Sig  . ELIQUIS 5 MG TABS tablet Take 1 tablet (5 mg total) by mouth 2 (two) times daily.  . meclizine  (ANTIVERT) 12.5 MG tablet Take 1 tablet by mouth as needed for dizziness.  . nitroGLYCERIN (NITROSTAT) 0.4 MG SL tablet Place 1 tablet under the tongue as needed for chest pain.     Allergies:   Aciphex [rabeprazole sodium] and Omeprazole   Social History   Socioeconomic History  . Marital status: Married    Spouse name: Not on file  . Number of children: Not on file  . Years of education: Not on file  . Highest education level: Not on file  Occupational History  . Not on file  Social Needs  . Financial resource strain: Not on file  . Food insecurity:    Worry: Not on file    Inability: Not on file  . Transportation needs:    Medical: Not on file    Non-medical: Not on file  Tobacco Use  . Smoking status: Former Research scientist (life sciences)  . Smokeless tobacco: Never Used  Substance and Sexual Activity  . Alcohol use: No    Frequency: Never  . Drug use: No  . Sexual activity: Not on file  Lifestyle  . Physical activity:    Days per week: Not on file    Minutes per session: Not on file  . Stress: Not on file  Relationships  . Social connections:    Talks on phone: Not on file    Gets together: Not on file    Attends religious service: Not on file    Active member of club  or organization: Not on file    Attends meetings of clubs or organizations: Not on file    Relationship status: Not on file  Other Topics Concern  . Not on file  Social History Narrative  . Not on file     Family History: The patient's family history includes Cancer in his father. ROS:   Please see the history of present illness.    All 14 point review of systems negative except as described per history of present illness  EKGs/Labs/Other Studies Reviewed:      Recent Labs: 01/20/2018: BUN 15; Creatinine, Ser 1.18; Hemoglobin 15.0; Platelets 242; Potassium 4.8; Sodium 139  Recent Lipid Panel No results found for: CHOL, TRIG, HDL, CHOLHDL, VLDL, LDLCALC, LDLDIRECT  Physical Exam:    VS:  BP (!) 150/70 (BP  Location: Left Arm, Patient Position: Sitting, Cuff Size: Normal)   Pulse 61   Ht 5\' 11"  (1.803 m)   Wt 169 lb (76.7 kg) Comment: verbal  SpO2 95%   BMI 23.57 kg/m     Wt Readings from Last 3 Encounters:  03/01/18 169 lb (76.7 kg)  01/20/18 169 lb (76.7 kg)     GEN:  Well nourished, well developed in no acute distress HEENT: Normal NECK: No JVD; No carotid bruits LYMPHATICS: No lymphadenopathy CARDIAC: RRR, no murmurs, no rubs, no gallops RESPIRATORY:  Clear to auscultation without rales, wheezing or rhonchi  ABDOMEN: Soft, non-tender, non-distended MUSCULOSKELETAL:  No edema; No deformity  SKIN: Warm and dry LOWER EXTREMITIES: no swelling NEUROLOGIC:  Alert and oriented x 3 PSYCHIATRIC:  Normal affect   ASSESSMENT:    1. Coronary artery disease involving native coronary artery of native heart without angina pectoris   2. Essential hypertension   3. Chronic atrial fibrillation ([place])   4. Dyslipidemia   5. S/P CABG x 3   6. Bradycardia by electrocardiogram    PLAN:    In order of problems listed above:  1. Coronary artery disease stable on appropriate medications which I will continue. 2. Essential hypertension blood pressure well controlled continue present management. 3. Chronic atrial fibrillation we had a long discussion today about this problem with talking about potentially converting him however he is completely asymptomatic he is anticoagulated his rate is controlled he does not want to have a cardioversion. 4. Dyslipidemia we will put him on small dose of statin will start with pravastatin 40 mg. 5. Status post coronary artery bypass grafting well from that point review. 6. Bradycardia: Not critical.  We will continue present management.   Medication Adjustments/Labs and Tests Ordered: Current medicines are reviewed at length with the patient today.  Concerns regarding medicines are outlined above.  No orders of the defined types were placed in this  encounter.  Medication changes: No orders of the defined types were placed in this encounter.   Signed, Park Liter, MD, [facility] 03/01/2018 11:05 AM    [place]

## 2018-05-05 DIAGNOSIS — I10 Essential (primary) hypertension: Secondary | ICD-10-CM | POA: Diagnosis not present

## 2018-05-05 DIAGNOSIS — Z1389 Encounter for screening for other disorder: Secondary | ICD-10-CM | POA: Diagnosis not present

## 2018-05-05 DIAGNOSIS — J449 Chronic obstructive pulmonary disease, unspecified: Secondary | ICD-10-CM | POA: Diagnosis not present

## 2018-05-05 DIAGNOSIS — Z0000 Encounter for general adult medical examination without abnormal findings: Secondary | ICD-10-CM | POA: Diagnosis not present

## 2018-05-05 DIAGNOSIS — Z23 Encounter for immunization: Secondary | ICD-10-CM | POA: Diagnosis not present

## 2018-05-05 DIAGNOSIS — R531 Weakness: Secondary | ICD-10-CM | POA: Diagnosis not present

## 2018-05-05 DIAGNOSIS — I4891 Unspecified atrial fibrillation: Secondary | ICD-10-CM | POA: Diagnosis not present

## 2018-05-05 DIAGNOSIS — I2510 Atherosclerotic heart disease of native coronary artery without angina pectoris: Secondary | ICD-10-CM | POA: Diagnosis not present

## 2018-05-05 DIAGNOSIS — E7800 Pure hypercholesterolemia, unspecified: Secondary | ICD-10-CM | POA: Diagnosis not present

## 2018-05-12 ENCOUNTER — Ambulatory Visit: Payer: Medicare HMO | Admitting: Neurology

## 2018-05-12 ENCOUNTER — Encounter: Payer: Self-pay | Admitting: Neurology

## 2018-05-12 VITALS — BP 136/87 | HR 49

## 2018-05-12 DIAGNOSIS — M6259 Muscle wasting and atrophy, not elsewhere classified, multiple sites: Secondary | ICD-10-CM

## 2018-05-12 DIAGNOSIS — G729 Myopathy, unspecified: Secondary | ICD-10-CM

## 2018-05-12 DIAGNOSIS — R531 Weakness: Secondary | ICD-10-CM

## 2018-05-12 NOTE — Patient Instructions (Addendum)
I would like to refer you to a rheumatologist, since you were not seen last year.   Also, please think about the referral to neuromuscular division at [facility] or [place].

## 2018-05-12 NOTE — Progress Notes (Signed)
Subjective:    Patient ID: Cameron Willis is a 84 y.o. male.  HPI     Interim history:   Cameron Willis is an 84 year old right-handed gentleman with an underlying medical history of degenerative disc disease in the cervical spine, and degenerative spine disease of the lumbar spine including lumbar spine stenosis, paroxysmal A. fib, arthritis with status post right total knee replacement in August 2017, who presents for reevaluation of his global weakness. The patient is accompanied by his son, Cameron Willis today and is referred by his PCP, Dr. Nona Dell.   I last saw him on 06/15/2017, at which time we talked about his test results from prior evaluations, he reported feeling stable, no better or worse. I explained his muscle biopsy results from his left thigh and shoulder areas to him, showing signs of muscle degeneration but no telltale signs of an underlying immunological process or inflammatory process. I suggested as needed follow-up. I also discussed his case with his permission with one of our neuromuscular specialists. I had previously offered referral to neuromuscular specialist at one of the academic centers in the area but patient had declined.  Today, 05/12/2018: He reports feeling weaker, no SOB. Sometimes dysphagia, but no numbness, no pain, with the exception of R knee discomfort. I had referred him to rheum last year. Son has been diagnosed with PMR recently. Pt did not actually go to rheum, we called Dr. Melissa Noon office, and pt had cancelled appt in 05/2017.   The patient's allergies, current medications, family history, past medical history, past social history, past surgical history and problem list were reviewed and updated as appropriate.   Previously (copied from previous notes for reference):   I saw him on 03/12/2017, at which time he reported no actual new symptoms. He was normal walking. He was mostly wheelchair-bound. He had no recent falls. We talked about his recent EMG test  results. I suggested a muscle biopsy. I suggested referral to rheumatology as well. We talked about potentially sending him to a larger academic center for further evaluation and management such as at [facility] or [facility]. He had interim muscle biopsy after the left thigh as well as comparison biopsy with left deltoid. 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 The discussion involved differential diagnoses of disuse atrophy, steroid myopathy, hypovitaminosis D, hypothyroidism, polymyalgia rheumatica, rapid weight loss and paraneoplastic syndrome. There was apparently no telltale evidence of inflammatory myopathy or dystrophy or rhabdomyolysis. Findings between his left deltoid and left thigh muscle biopsies were similar.   I first met him on 02/05/2017 at the request of his neurosurgeon, at which time patient reported inability to walk after her recent right total knee replacement within the year. He was noted to have more global weakness and hyperreflexia. I suggested that we proceed with further neuromuscular workup in the form of blood work and EMG and nerve conduction testing. He had prior EMG and nerve conduction testing but a formal report was not available. Blood work from 02/05/2017 showed A1c in the prediabetic range, borderline low vitamin D at 29, positive ANA with positive ENA RNP antibodies, normal muscle enzymes, normal TSH, normal RPR, normal B12, normal ESR.   EMG and nerve conduction testing of mostly the right upper and right lower extremities on 02/26/2017 showed: IMPRESSION:  Abnormal study demonstrating: 1. Severe right peroneal neuropathy, due to absent sensory and motor nerve responses. 2. Abnormal lower extremity motor responses,  with corresponding normal sensory responses, can be seen with lumbar radiculopathies (given patient's lumbar spinal stenosis) or motor neuropathy. 3. Myopathic  motor unit recruitment detected in right upper extremity biceps and triceps muscles. Myotonic discharges noted in cervical paraspinal muscles. These findings can be seen in multiple conditions including myotonic dystrophies, muscle channelopathies, inflammatory myopathies or other conditions. 4. Abnormal sensory and motor nerve responses in right upper extremity may be due to polyneuropathy or plexopathy, but not better localized with needle EMG.   I suggested we talked about further workup.    02/05/2017: (He) reports progressive weakness. He has seen Dr. Letta Pate recently. He had a cervical spine MRI on 12/03/2016 without contrast which I reviewed: IMPRESSION: 1. Multilevel cervical spondylosis greatest at the C4-5 level where a right central disc protrusion impinges the right anterior cord with cord flattening. 2. Mild C4-5 and C5-6 canal stenosis.  No high-grade canal stenosis. 3. Extensive uncovertebral and facet hypertrophy with multiple levels of advanced foraminal narrowing bilaterally. 4. Right-sided C3-4 small facet effusion and mild facet edema, likely degenerative.   He subsequently had a thoracic spine MRI without contrast through your office on 12/26/2016 and I reviewed the results: Impression: No abnormality of the thoracic spinal cord. No spinal canal or neural foraminal stenosis.   You ordered a lumbar spine MRI and EMG and nerve conduction test. He was noted to have hyperreflexia, he has had neck pain. He has had low back pain. I reviewed your office note from 01/05/2017, which you kindly included. Prior to that, you saw him on 12/09/2016.   Unfortunately, we did not have any test results on the lumbar spine MRI and no test results on the EMG and nerve conduction test. We called her office several times and were able to get the last office note from 01/26/2017 at which time we discussed his EMG results and lumbar spine MRI results. We did receive a copy of the lumbar spine MRI  report from his test on 01/13/2017. Impression: Degenerative change of the lumbar spine superimposed on borderline congenital canal narrowing. Severe canal stenosis at L3-4, mild at L1-2 and L2-3. Multilevel neural foraminal narrowing: Moderate to severe on the right at L4-5. Bilateral L3-4 facet arthropathy with acute reactive changes. He had EMG and nerve conduction testing of both lower extremities on 01/14/2017 under Dr. Brien Few. A formal typed report is not available for my review today. We've requested the report from your office but were told that there is none at this time.   The patient reports that he has had weakness and muscle atrophy in his upper extremities for the past few years, perhaps 7 years, with gradual worsening. His major complaint is the inability to walk since his right total knee replacement surgery in August 2017. He had arthroscopic surgery a few weeks after as I understand. This was under Dr. Lorin Mercy. He has also seen Dr. Lorin Mercy' PA in follow-up. He has not recently fallen. He has had tightening of his tendons of both hamstrings. He reports no significant pain at this time. He is essentially wheelchair bound. He had physical therapy but did not make any progress as I understand. He does not report any difficulty swallowing or breathing.   He quit smoking over 10 years ago. He is retired. He lives with his wife. They have one son. He worked as a Dealer and also on a farm. He reports that he also may have had a hip MRI through your office. Results are not available for my  review on that test.  His Past Medical History Is Significant For: Past Medical History:  Diagnosis Date  . Cervical disc disorder at C4-C5 level with myelopathy 12/05/2016  . Contracture of right knee 12/05/2016  . COPD (chronic obstructive pulmonary disease) ([place]) 03/22/2012  . Coronary artery disease involving native coronary artery of native heart without angina pectoris 03/22/2012   Overview:  Coronary artery  bypass graft August 2013 LIMA to LAD and SVG to RCA and SVG to obtuse marginal  . Dyslipidemia 03/26/2015  . Gait disturbance 10/14/2017  . GERD (gastroesophageal reflux disease) 03/22/2012  . HTN (hypertension) 03/22/2012  . MI, old 03/22/2012  . Paroxysmal atrial fibrillation ([place]) 03/26/2015   Overview:  Chads score 2, was anticoagulated but developed tarry stool optic admission was withdrawn for now scheduled to see gastroenterologist  . Pre-op evaluation 03/25/2016  . S/P CABG x 3 03/25/2012  . Tobacco abuse 03/22/2012   Overview:  Quit 2 years ago    His Past Surgical History Is Significant For: Past Surgical History:  Procedure Laterality Date  . CARDIAC CATHETERIZATION    . CORONARY ARTERY BYPASS GRAFT      His Family History Is Significant For: Family History  Problem Relation Age of Onset  . Cancer Father     His Social History Is Significant For: Social History   Socioeconomic History  . Marital status: Married    Spouse name: Not on file  . Number of children: Not on file  . Years of education: Not on file  . Highest education level: Not on file  Occupational History  . Not on file  Social Needs  . Financial resource strain: Not on file  . Food insecurity:    Worry: Not on file    Inability: Not on file  . Transportation needs:    Medical: Not on file    Non-medical: Not on file  Tobacco Use  . Smoking status: Former Research scientist (life sciences)  . Smokeless tobacco: Never Used  Substance and Sexual Activity  . Alcohol use: No    Frequency: Never  . Drug use: No  . Sexual activity: Not on file  Lifestyle  . Physical activity:    Days per week: Not on file    Minutes per session: Not on file  . Stress: Not on file  Relationships  . Social connections:    Talks on phone: Not on file    Gets together: Not on file    Attends religious service: Not on file    Active member of club or organization: Not on file    Attends meetings of clubs or organizations: Not on file     Relationship status: Not on file  Other Topics Concern  . Not on file  Social History Narrative  . Not on file    His Allergies Are:  Allergies  Allergen Reactions  . Aciphex [Rabeprazole Sodium] Hives  . Omeprazole Hives  :   His Current Medications Are:  Outpatient Encounter Medications as of 05/12/2018  Medication Sig  . ELIQUIS 5 MG TABS tablet Take 1 tablet (5 mg total) by mouth 2 (two) times daily.  . meclizine (ANTIVERT) 12.5 MG tablet Take 1 tablet by mouth as needed for dizziness.  . nitroGLYCERIN (NITROSTAT) 0.4 MG SL tablet Place 1 tablet under the tongue as needed for chest pain.  . pravastatin (PRAVACHOL) 40 MG tablet Take 1 tablet (40 mg total) by mouth every evening.   No facility-administered encounter medications on file as of  05/12/2018.   :  Review of Systems:  Out of a complete 14 point review of systems, all are reviewed and negative with the exception of these symptoms as listed below: Review of Systems  Neurological:       Pt presents today to discuss his leg weakness which has gotten worse.    Objective:  Neurological Exam  Physical Exam Physical Examination:   Vitals:   05/12/18 1255  BP: 136/87  Pulse: (!) 49   General Examination: The patient is a very pleasant 84 y.o. male in no acute distress. He appears frail, in electric WC.   HEENT:Normocephalic, atraumatic, pupils are equal, round and reactive to light and accommodation. Extraocular tracking is good without limitation to gaze excursion or nystagmus noted. Normal smooth pursuit is noted. Hearing is grossly intact or mildly impaired. Face is symmetric with normal facial animation and normal facial sensation. Speech is clear with no dysarthria noted. There is no hypophonia. There is no lip, neck/head, jaw or voice tremor. Neck shows mild decrease in range of motion. Airway examination reveals mild mouth dryness, adequate dental hygiene, mild airway crowding. Tongue protrudes centrally and  palate elevates symmetrically.   Chest:Clear to auscultation without wheezing, rhonchi or crackles noted.  Heart:S1+S2+0, regular and normal without murmurs, rubs or gallops noted.   Abdomen:Soft, non-tender and non-distended with normal bowel sounds appreciated on auscultation.  Extremities:There is nopitting edema in the distal lower extremities bilaterally, except trace puffiness L ankle.   Skin: Warm and dry without trophic changes noted.  Musculoskeletal: exam revealsno changes. He has global muscle wasting, particularly noticeable in both biceps areas and also both thigh areas, mild tightness in the hamstrings,more on R,wasting of thenar and hyperthenar eminences. Some discomfort L knee and R shoulder.   Neurologically:  Mental status: The patient is awake, alert and oriented in all 4 spheres. Hisimmediate and remote memory, attention, language skills and fund of knowledge are appropriate. There is no evidence of aphasia, agnosia, apraxia or anomia. Speech is clear with normal prosody and enunciation. Thought process is linear. Mood is normaland affect is normal.  Cranial nerves II - XII are as described above under HEENT exam.  Motor exam: Thin bulk, global strength of 4 out of 5, weaker in both hip flexors, also weaker in the right foot dorsiflexion, weaker in prox UEs, about 4-/5. Inability to fully open both hands. No resting tremor. Romberg is not testable.Reflexes are about 2+ in both upper extremities, 2+ in both knees, trace in both ankles. Fine motor skills are globally moderately impaired, heel to shin is not possible.No obvious fasciculations noted, no obvious dysmetria or intention tremor but he is unable to do heel to shin.  Sensory exam: intact to light touch.Unable to stand unsupported and unable to walk.   Assessment and Plan:  In summary, Cameron Willis a very pleasant 84 year old male with an underlying medical history of degenerative disc disease  in the cervical spine,anddegenerative spine disease of the lumbar spine including lumbar spine stenosis,paroxysmal A. fib, arthritis with status post right total knee replacement in August 2017, who presents for re-evaluation of his progressive weakness, some hyperreflexia and inability to walk since his knee replacement surgery last year. We did testing in the form of EMG nerve conduction testing and blood work. He does have a positive ANA and ENA RNP antibodies and EMG, nerve conduction testing in late July was concerning for a more widespread myopathic disorder, differential diagnosis given his history and age could be  a rare form of muscular dystrophy versus inclusion body myositis. He had left deltoid and left thigh muscle biopsies in October 2018 with nonspecific results, in keeping with mild to moderate type II myofibers atrophy without myofibers necrosis or inflammation. So far, and actual diagnosis has not been established. He did not see rheumatology. His son has recently been diagnosed with PMR. I suggested we could resend a referral to rheumatology and also explained to him that I would like for him to see a neuromuscular specialist at one of the neuromuscular divisions at [facility] or [place]. He had a borderline low vitamin D back in July and I suggested that he started over-the-counter vitamin D supplement. He has been taking vit D by mouth over-the-counter. He has had cervical, thoracic and lumbar spine MRIs. He has had peronealneuropathy, and he has degenerative spine disease with lumbar spine stenosis.At this juncture, we mutually agreed for an as needed follow-up with me and they would like to think about the referrals I suggested. They are encouraged to call if he would like to pursue the referrals to neuromuscular specialty and rheum. I answered all their questions today and the patient and his son were in agreement. I spent 25 minutes in total face-to-face time with the patient, more than  50% of which was spent in counseling and coordination of care, reviewing test results, reviewing medication and discussing or reviewing the diagnosis of myopathy, the prognosis and treatment options. Pertinent laboratory and imaging test results that were available during this visit with the patient were reviewed by me and considered in my medical decision making (see chart for details).

## 2018-08-09 DIAGNOSIS — I10 Essential (primary) hypertension: Secondary | ICD-10-CM | POA: Diagnosis not present

## 2018-08-09 DIAGNOSIS — R531 Weakness: Secondary | ICD-10-CM | POA: Diagnosis not present

## 2019-01-21 ENCOUNTER — Other Ambulatory Visit: Payer: Self-pay | Admitting: Cardiology

## 2019-02-07 DIAGNOSIS — R531 Weakness: Secondary | ICD-10-CM | POA: Diagnosis not present

## 2019-02-08 ENCOUNTER — Telehealth: Payer: Self-pay | Admitting: Neurology

## 2019-02-08 DIAGNOSIS — M6250 Muscle wasting and atrophy, not elsewhere classified, unspecified site: Secondary | ICD-10-CM

## 2019-02-08 DIAGNOSIS — R531 Weakness: Secondary | ICD-10-CM

## 2019-02-08 NOTE — Addendum Note (Signed)
Addended by: Star Age on: 02/08/2019 12:50 PM   Modules accepted: Orders

## 2019-02-08 NOTE — Telephone Encounter (Signed)
I called pt. He is requesting a referral to rheumatology and the neuromuscular specialist at [facility]. He is wondering if Dr. Rexene Alberts is still agreeable to placing those referrals for him, as recommended at her last visit with pt in 05/2018.Marland Kitchen

## 2019-02-08 NOTE — Telephone Encounter (Signed)
Pt is asking for a call to discuss the outcome of his last appointment with Dr Rexene Alberts from 2018

## 2019-02-08 NOTE — Telephone Encounter (Signed)
As requested, I placed referrals to rheum and neuromuscular at [facility].

## 2019-02-09 NOTE — Telephone Encounter (Signed)
Noted referrals have been sent.

## 2019-03-31 ENCOUNTER — Telehealth: Payer: Self-pay | Admitting: Neurology

## 2019-03-31 NOTE — Telephone Encounter (Signed)
Patient was scheduled with Dr. Amil Amen. Patient called and CX his apt. Per Dr. Melissa Noon office. Thanks Hinton Dyer

## 2019-05-23 DIAGNOSIS — G729 Myopathy, unspecified: Secondary | ICD-10-CM | POA: Diagnosis not present

## 2019-05-23 DIAGNOSIS — Z23 Encounter for immunization: Secondary | ICD-10-CM | POA: Diagnosis not present

## 2019-05-23 DIAGNOSIS — Z1389 Encounter for screening for other disorder: Secondary | ICD-10-CM | POA: Diagnosis not present

## 2019-05-23 DIAGNOSIS — J449 Chronic obstructive pulmonary disease, unspecified: Secondary | ICD-10-CM | POA: Diagnosis not present

## 2019-05-23 DIAGNOSIS — Z0000 Encounter for general adult medical examination without abnormal findings: Secondary | ICD-10-CM | POA: Diagnosis not present

## 2019-05-23 DIAGNOSIS — I4891 Unspecified atrial fibrillation: Secondary | ICD-10-CM | POA: Diagnosis not present

## 2019-05-23 DIAGNOSIS — I25810 Atherosclerosis of coronary artery bypass graft(s) without angina pectoris: Secondary | ICD-10-CM | POA: Diagnosis not present

## 2019-05-23 DIAGNOSIS — I10 Essential (primary) hypertension: Secondary | ICD-10-CM | POA: Diagnosis not present

## 2019-05-23 DIAGNOSIS — E7800 Pure hypercholesterolemia, unspecified: Secondary | ICD-10-CM | POA: Diagnosis not present

## 2019-05-23 DIAGNOSIS — N400 Enlarged prostate without lower urinary tract symptoms: Secondary | ICD-10-CM | POA: Diagnosis not present

## 2019-05-25 ENCOUNTER — Encounter: Payer: Self-pay | Admitting: Gastroenterology

## 2019-05-27 DIAGNOSIS — R7309 Other abnormal glucose: Secondary | ICD-10-CM | POA: Diagnosis not present

## 2019-06-02 ENCOUNTER — Ambulatory Visit: Payer: Medicare HMO | Admitting: Gastroenterology

## 2019-06-02 ENCOUNTER — Other Ambulatory Visit: Payer: Self-pay

## 2019-06-02 ENCOUNTER — Encounter: Payer: Self-pay | Admitting: Gastroenterology

## 2019-06-02 VITALS — BP 138/72 | HR 51 | Temp 97.9°F | Ht 71.0 in

## 2019-06-02 DIAGNOSIS — R1013 Epigastric pain: Secondary | ICD-10-CM

## 2019-06-02 DIAGNOSIS — I4891 Unspecified atrial fibrillation: Secondary | ICD-10-CM | POA: Diagnosis not present

## 2019-06-02 DIAGNOSIS — K219 Gastro-esophageal reflux disease without esophagitis: Secondary | ICD-10-CM

## 2019-06-02 DIAGNOSIS — G8929 Other chronic pain: Secondary | ICD-10-CM | POA: Diagnosis not present

## 2019-06-02 DIAGNOSIS — J449 Chronic obstructive pulmonary disease, unspecified: Secondary | ICD-10-CM | POA: Diagnosis not present

## 2019-06-02 MED ORDER — ONDANSETRON 4 MG PO TBDP: ORAL_TABLET | ORAL | 0 refills | Status: DC

## 2019-06-02 MED ORDER — PANTOPRAZOLE SODIUM 40 MG PO TBEC: 40.0000 mg | DELAYED_RELEASE_TABLET | Freq: Every day | ORAL | 11 refills | Status: DC

## 2019-06-02 NOTE — Progress Notes (Signed)
Chief Complaint:   Referring Provider:  Nona Dell, Corene Cornea, MD      ASSESSMENT AND PLAN;   #1.  Epigastric pain -likely due to gastritis d/t NSAIDs.  #2.  GERD with small hiatal hernia with intermittent N/V  #3. A Fib on Eliquis, Idiopathic myopathy (wheelchair-bound), OA, CAD s/p CABG, HTN, HLD, COPD (not on O2)  Plan: - Protonix 40mg  po qd #30, 1/2 hr before breakfast. - Should make FU appt with Dr Bettina Gavia (RE-Bradycardia/dizziness) - Zofran 4mg  ODT Q6-8 hrs. #20. - EGD if not better (off Eliquis, after cardio clearance-if needed). - Stop celebrex. - FU 12 weeks. - Since patient is wheelchair-bound, family will get in touch with Dr Nona Dell- RE- PT/OT.   HPI:    Cameron Willis is a 85 y.o. male  With epigastric pain x 1 month, more prominently after eating, associated with heartburn and some regurgitation.  No odynophagia or dysphagia.  Has been started on Celebrex few weeks ago.  He also takes Eliquis.  Has intermittent N/V especially in the morning.  No fever chills night sweats or any weight loss.  Denies use of any other OTC nonsteroidals/pain medications.  Occasional constipation but not bad.  Would like to hold off on endoscopic procedures if possible.  Has been getting dizzy intermittently.  Was found to have bradycardia by Dr. Raliegh Ip.  Was told to make follow-up appointment which he has not yet.  No lower abdominal pain.  Adamantly refuses colonoscopy.  He also had blood work performed by Dr. Nona Dell -I do not have the results of the present time.  According to the family it was unremarkable.  Per patient -Celebrex is not working and does not cause any difference in joint pains    Past Medical History:  Diagnosis Date  . Anxiety and depression   . Cervical disc disorder at C4-C5 level with myelopathy 12/05/2016  . Contracture of right knee 12/05/2016  . COPD (chronic obstructive pulmonary disease) ([place]) 03/22/2012  . Coronary artery disease involving native coronary  artery of native heart without angina pectoris 03/22/2012   Overview:  Coronary artery bypass graft August 2013 LIMA to LAD and SVG to RCA and SVG to obtuse marginal  . Dyslipidemia 03/26/2015  . Esophageal stenosis   . Gait disturbance 10/14/2017  . GERD (gastroesophageal reflux disease) 03/22/2012  . Hemorrhoids   . History of degenerative disc disease   . HTN (hypertension) 03/22/2012  . Hypertrophy of prostate   . MI, old 03/22/2012  . Osteoarthrosis   . Paroxysmal atrial fibrillation ([place]) 03/26/2015   Overview:  Chads score 2, was anticoagulated but developed tarry stool optic admission was withdrawn for now scheduled to see gastroenterologist  . Pre-op evaluation 03/25/2016  . S/P CABG x 3 03/25/2012  . Tobacco abuse 03/22/2012   Overview:  Quit 2 years ago    Past Surgical History:  Procedure Laterality Date  . CARDIAC CATHETERIZATION    . COLONOSCOPY  05/07/2015   Colonic polyps status post polypectomy. Internal hemorrhoids ( likely etiology of rectal bleeding)   . CORONARY ARTERY BYPASS GRAFT    . ESOPHAGOGASTRODUODENOSCOPY  11/06/2005   Smal hiatal hernia. Moderate gastritis  . HEMORRHOID SURGERY  1996    Family History  Problem Relation Age of Onset  . Cancer Father   . Esophageal cancer Father   . Colon cancer Neg Hx     Social History   Tobacco Use  . Smoking status: Former Research scientist (life sciences)  . Smokeless tobacco:  Never Used  Substance Use Topics  . Alcohol use: No    Frequency: Never  . Drug use: No    Current Outpatient Medications  Medication Sig Dispense Refill  . ELIQUIS 5 MG TABS tablet Take 1 tablet (5 mg total) by mouth 2 (two) times daily. 90 tablet 3  . meclizine (ANTIVERT) 12.5 MG tablet Take 1 tablet by mouth as needed for dizziness.    . nitroGLYCERIN (NITROSTAT) 0.4 MG SL tablet Place 1 tablet under the tongue as needed for chest pain.    . pravastatin (PRAVACHOL) 40 MG tablet TAKE 1 TABLET(40 MG) BY MOUTH EVERY EVENING 90 tablet 0   No current  facility-administered medications for this visit.     Allergies  Allergen Reactions  . Aciphex [Rabeprazole Sodium] Hives  . Omeprazole Hives    Review of Systems:  Constitutional: Denies fever, chills, diaphoresis, appetite change and fatigue.  HEENT: Denies photophobia, eye pain, redness, hearing loss, ear pain, congestion, sore throat, rhinorrhea, sneezing, mouth sores, neck pain, neck stiffness and tinnitus.   Respiratory: Denies SOB, DOE, cough, chest tightness,  and wheezing.   Cardiovascular: Denies chest pain, palpitations and leg swelling.  Genitourinary: Denies dysuria, urgency, frequency, hematuria, flank pain and difficulty urinating.  Musculoskeletal: Has myalgias, back pain, joint swelling, arthralgias.  Skin: No rash.  Neurological: Has idiopathic myopathy, not able to walk.  On wheelchair. Hematological: Denies adenopathy. Easy bruising, personal or family bleeding history  Psychiatric/Behavioral: has anxiety or depression     Physical Exam:    BP 138/72   Pulse (!) 51   Temp 97.9 F (36.6 C)   Ht 5\' 11"  (1.803 m)   BMI 23.57 kg/m  Filed Weights   Constitutional:  Well-developed, in no acute distress. Psychiatric: Normal mood and affect. Behavior is normal. HEENT: Pupils normal.  Conjunctivae are normal. No scleral icterus. Neck supple.  Cardiovascular: Normal rate, regular rhythm. No edema Pulmonary/chest: Effort normal and breath sounds normal. No wheezing, rales or rhonchi. Abdominal: Soft, nondistended.  Mild epigastric tenderness.  Bowel sounds active throughout. There are no masses palpable. No hepatomegaly. Rectal:  defered Neurological: Alert and oriented to person place and time. Skin: Skin is warm and dry. No rashes noted.  Data Reviewed: I have personally reviewed following labs and imaging studies  CBC: CBC Latest Ref Rng & Units 01/20/2018  WBC 3.4 - 10.8 x10E3/uL 8.0  Hemoglobin 13.0 - 17.7 g/dL 15.0  Hematocrit 37.5 - 51.0 % 43.0   Platelets 150 - 450 x10E3/uL 242    CMP: CMP Latest Ref Rng & Units 01/20/2018  Glucose 65 - 99 mg/dL 86  BUN 8 - 27 mg/dL 15  Creatinine 0.76 - 1.27 mg/dL 1.18  Sodium 134 - 144 mmol/L 139  Potassium 3.5 - 5.2 mmol/L 4.8  Chloride 96 - 106 mmol/L 102  CO2 20 - 29 mmol/L 22  Calcium 8.6 - 10.2 mg/dL 9.7  Discussed extensively with the patient patient's family. Given verbal and written instructions. They will also call us if he still has problems. 45 minutes spent with the patient today. Greater than 50% was spent in counseling and coordination of care with the patient     Carmell Austria, MD 06/02/2019, 10:42 AM  Cc: Townsend Roger, MD

## 2019-06-02 NOTE — Patient Instructions (Signed)
If you are age 83 or older, your body mass index should be between 23-30. Your Body mass index is 23.57 kg/m. If this is out of the aforementioned range listed, please consider follow up with your Primary Care Provider.  If you are age 59 or younger, your body mass index should be between 19-25. Your Body mass index is 23.57 kg/m. If this is out of the aformentioned range listed, please consider follow up with your Primary Care Provider.   We have sent the following medications to your pharmacy for you to pick up at your convenience: Protonix  Zofran   Make an appointment with Dr. Bettina Gavia.   Follow up in 12 weeks with virtual visit.   Thank you,  Dr. Jackquline Denmark

## 2019-06-09 DIAGNOSIS — W19XXXA Unspecified fall, initial encounter: Secondary | ICD-10-CM | POA: Diagnosis not present

## 2019-06-09 DIAGNOSIS — Z993 Dependence on wheelchair: Secondary | ICD-10-CM | POA: Diagnosis not present

## 2019-06-09 DIAGNOSIS — S7290XD Unspecified fracture of unspecified femur, subsequent encounter for closed fracture with routine healing: Secondary | ICD-10-CM | POA: Diagnosis not present

## 2019-06-09 DIAGNOSIS — S8990XA Unspecified injury of unspecified lower leg, initial encounter: Secondary | ICD-10-CM | POA: Diagnosis not present

## 2019-06-09 DIAGNOSIS — S72402A Unspecified fracture of lower end of left femur, initial encounter for closed fracture: Secondary | ICD-10-CM | POA: Diagnosis not present

## 2019-06-09 DIAGNOSIS — R030 Elevated blood-pressure reading, without diagnosis of hypertension: Secondary | ICD-10-CM | POA: Diagnosis not present

## 2019-06-09 DIAGNOSIS — R609 Edema, unspecified: Secondary | ICD-10-CM | POA: Diagnosis not present

## 2019-06-09 DIAGNOSIS — R001 Bradycardia, unspecified: Secondary | ICD-10-CM | POA: Diagnosis not present

## 2019-06-09 DIAGNOSIS — M25562 Pain in left knee: Secondary | ICD-10-CM | POA: Diagnosis not present

## 2019-06-09 DIAGNOSIS — N179 Acute kidney failure, unspecified: Secondary | ICD-10-CM | POA: Diagnosis not present

## 2019-06-09 DIAGNOSIS — Z951 Presence of aortocoronary bypass graft: Secondary | ICD-10-CM | POA: Diagnosis not present

## 2019-06-09 DIAGNOSIS — S72492A Other fracture of lower end of left femur, initial encounter for closed fracture: Secondary | ICD-10-CM | POA: Diagnosis not present

## 2019-06-09 DIAGNOSIS — E871 Hypo-osmolality and hyponatremia: Secondary | ICD-10-CM | POA: Diagnosis not present

## 2019-06-09 DIAGNOSIS — R52 Pain, unspecified: Secondary | ICD-10-CM | POA: Diagnosis not present

## 2019-06-09 DIAGNOSIS — I4891 Unspecified atrial fibrillation: Secondary | ICD-10-CM | POA: Diagnosis not present

## 2019-06-09 DIAGNOSIS — I2510 Atherosclerotic heart disease of native coronary artery without angina pectoris: Secondary | ICD-10-CM | POA: Diagnosis not present

## 2019-06-09 DIAGNOSIS — I252 Old myocardial infarction: Secondary | ICD-10-CM | POA: Diagnosis not present

## 2019-06-10 DIAGNOSIS — I10 Essential (primary) hypertension: Secondary | ICD-10-CM | POA: Diagnosis not present

## 2019-06-10 DIAGNOSIS — J449 Chronic obstructive pulmonary disease, unspecified: Secondary | ICD-10-CM | POA: Diagnosis not present

## 2019-06-10 DIAGNOSIS — S72402A Unspecified fracture of lower end of left femur, initial encounter for closed fracture: Secondary | ICD-10-CM | POA: Diagnosis not present

## 2019-06-10 DIAGNOSIS — I252 Old myocardial infarction: Secondary | ICD-10-CM | POA: Diagnosis not present

## 2019-06-12 DIAGNOSIS — S72402A Unspecified fracture of lower end of left femur, initial encounter for closed fracture: Secondary | ICD-10-CM | POA: Diagnosis not present

## 2019-06-14 DIAGNOSIS — S72492A Other fracture of lower end of left femur, initial encounter for closed fracture: Secondary | ICD-10-CM | POA: Diagnosis not present

## 2019-06-14 DIAGNOSIS — M79652 Pain in left thigh: Secondary | ICD-10-CM | POA: Diagnosis not present

## 2019-06-16 DIAGNOSIS — S72322A Displaced transverse fracture of shaft of left femur, initial encounter for closed fracture: Secondary | ICD-10-CM | POA: Diagnosis not present

## 2019-06-16 DIAGNOSIS — I2510 Atherosclerotic heart disease of native coronary artery without angina pectoris: Secondary | ICD-10-CM | POA: Diagnosis not present

## 2019-06-16 DIAGNOSIS — K219 Gastro-esophageal reflux disease without esophagitis: Secondary | ICD-10-CM | POA: Diagnosis not present

## 2019-06-16 DIAGNOSIS — Z951 Presence of aortocoronary bypass graft: Secondary | ICD-10-CM | POA: Diagnosis not present

## 2019-06-16 DIAGNOSIS — I4891 Unspecified atrial fibrillation: Secondary | ICD-10-CM | POA: Diagnosis not present

## 2019-06-16 DIAGNOSIS — Z79899 Other long term (current) drug therapy: Secondary | ICD-10-CM | POA: Diagnosis not present

## 2019-06-16 DIAGNOSIS — Z7401 Bed confinement status: Secondary | ICD-10-CM | POA: Diagnosis not present

## 2019-06-16 DIAGNOSIS — Z20828 Contact with and (suspected) exposure to other viral communicable diseases: Secondary | ICD-10-CM | POA: Diagnosis not present

## 2019-06-16 DIAGNOSIS — M810 Age-related osteoporosis without current pathological fracture: Secondary | ICD-10-CM | POA: Diagnosis not present

## 2019-06-16 DIAGNOSIS — I25810 Atherosclerosis of coronary artery bypass graft(s) without angina pectoris: Secondary | ICD-10-CM | POA: Diagnosis not present

## 2019-06-16 DIAGNOSIS — S72332A Displaced oblique fracture of shaft of left femur, initial encounter for closed fracture: Secondary | ICD-10-CM | POA: Diagnosis not present

## 2019-06-16 DIAGNOSIS — R030 Elevated blood-pressure reading, without diagnosis of hypertension: Secondary | ICD-10-CM | POA: Diagnosis not present

## 2019-06-16 DIAGNOSIS — M1990 Unspecified osteoarthritis, unspecified site: Secondary | ICD-10-CM | POA: Diagnosis not present

## 2019-06-16 DIAGNOSIS — R2689 Other abnormalities of gait and mobility: Secondary | ICD-10-CM | POA: Diagnosis not present

## 2019-06-16 DIAGNOSIS — D62 Acute posthemorrhagic anemia: Secondary | ICD-10-CM | POA: Diagnosis not present

## 2019-06-16 DIAGNOSIS — E871 Hypo-osmolality and hyponatremia: Secondary | ICD-10-CM | POA: Diagnosis not present

## 2019-06-16 DIAGNOSIS — L89620 Pressure ulcer of left heel, unstageable: Secondary | ICD-10-CM | POA: Diagnosis not present

## 2019-06-16 DIAGNOSIS — Z993 Dependence on wheelchair: Secondary | ICD-10-CM | POA: Diagnosis not present

## 2019-06-16 DIAGNOSIS — Z7902 Long term (current) use of antithrombotics/antiplatelets: Secondary | ICD-10-CM | POA: Diagnosis not present

## 2019-06-16 DIAGNOSIS — E785 Hyperlipidemia, unspecified: Secondary | ICD-10-CM | POA: Diagnosis not present

## 2019-06-16 DIAGNOSIS — S72002D Fracture of unspecified part of neck of left femur, subsequent encounter for closed fracture with routine healing: Secondary | ICD-10-CM | POA: Diagnosis not present

## 2019-06-16 DIAGNOSIS — S7292XD Unspecified fracture of left femur, subsequent encounter for closed fracture with routine healing: Secondary | ICD-10-CM | POA: Diagnosis not present

## 2019-06-16 DIAGNOSIS — M2550 Pain in unspecified joint: Secondary | ICD-10-CM | POA: Diagnosis not present

## 2019-06-16 DIAGNOSIS — I252 Old myocardial infarction: Secondary | ICD-10-CM | POA: Diagnosis not present

## 2019-06-16 DIAGNOSIS — S72402A Unspecified fracture of lower end of left femur, initial encounter for closed fracture: Secondary | ICD-10-CM | POA: Diagnosis not present

## 2019-06-16 DIAGNOSIS — N179 Acute kidney failure, unspecified: Secondary | ICD-10-CM | POA: Diagnosis not present

## 2019-06-16 DIAGNOSIS — R319 Hematuria, unspecified: Secondary | ICD-10-CM | POA: Diagnosis not present

## 2019-06-16 DIAGNOSIS — S79929A Unspecified injury of unspecified thigh, initial encounter: Secondary | ICD-10-CM | POA: Diagnosis not present

## 2019-06-16 DIAGNOSIS — D649 Anemia, unspecified: Secondary | ICD-10-CM | POA: Diagnosis not present

## 2019-06-16 DIAGNOSIS — N3000 Acute cystitis without hematuria: Secondary | ICD-10-CM | POA: Diagnosis not present

## 2019-06-16 DIAGNOSIS — R262 Difficulty in walking, not elsewhere classified: Secondary | ICD-10-CM | POA: Diagnosis not present

## 2019-06-16 DIAGNOSIS — N390 Urinary tract infection, site not specified: Secondary | ICD-10-CM | POA: Diagnosis not present

## 2019-06-20 DIAGNOSIS — R262 Difficulty in walking, not elsewhere classified: Secondary | ICD-10-CM | POA: Diagnosis not present

## 2019-06-20 DIAGNOSIS — M810 Age-related osteoporosis without current pathological fracture: Secondary | ICD-10-CM | POA: Diagnosis not present

## 2019-06-20 DIAGNOSIS — D649 Anemia, unspecified: Secondary | ICD-10-CM | POA: Diagnosis not present

## 2019-06-20 DIAGNOSIS — S7292XD Unspecified fracture of left femur, subsequent encounter for closed fracture with routine healing: Secondary | ICD-10-CM | POA: Diagnosis not present

## 2019-06-21 DIAGNOSIS — L89620 Pressure ulcer of left heel, unstageable: Secondary | ICD-10-CM | POA: Diagnosis not present

## 2019-06-22 ENCOUNTER — Telehealth: Payer: Self-pay | Admitting: Cardiology

## 2019-06-22 DIAGNOSIS — S72322A Displaced transverse fracture of shaft of left femur, initial encounter for closed fracture: Secondary | ICD-10-CM | POA: Diagnosis not present

## 2019-06-22 NOTE — Telephone Encounter (Signed)
Eriin from [facility] called stating they took patient off Eliquis for two days

## 2019-06-27 DIAGNOSIS — Z7401 Bed confinement status: Secondary | ICD-10-CM | POA: Diagnosis not present

## 2019-06-27 DIAGNOSIS — Z951 Presence of aortocoronary bypass graft: Secondary | ICD-10-CM | POA: Diagnosis not present

## 2019-06-27 DIAGNOSIS — Z7902 Long term (current) use of antithrombotics/antiplatelets: Secondary | ICD-10-CM | POA: Diagnosis not present

## 2019-06-27 DIAGNOSIS — R2689 Other abnormalities of gait and mobility: Secondary | ICD-10-CM | POA: Diagnosis not present

## 2019-06-27 DIAGNOSIS — R4182 Altered mental status, unspecified: Secondary | ICD-10-CM | POA: Diagnosis not present

## 2019-06-27 DIAGNOSIS — G8918 Other acute postprocedural pain: Secondary | ICD-10-CM | POA: Diagnosis not present

## 2019-06-27 DIAGNOSIS — S72332A Displaced oblique fracture of shaft of left femur, initial encounter for closed fracture: Secondary | ICD-10-CM | POA: Diagnosis not present

## 2019-06-27 DIAGNOSIS — N3000 Acute cystitis without hematuria: Secondary | ICD-10-CM | POA: Diagnosis not present

## 2019-06-27 DIAGNOSIS — R531 Weakness: Secondary | ICD-10-CM | POA: Diagnosis not present

## 2019-06-27 DIAGNOSIS — M1990 Unspecified osteoarthritis, unspecified site: Secondary | ICD-10-CM | POA: Diagnosis not present

## 2019-06-27 DIAGNOSIS — I10 Essential (primary) hypertension: Secondary | ICD-10-CM | POA: Diagnosis not present

## 2019-06-27 DIAGNOSIS — S72002D Fracture of unspecified part of neck of left femur, subsequent encounter for closed fracture with routine healing: Secondary | ICD-10-CM | POA: Diagnosis not present

## 2019-06-27 DIAGNOSIS — I4891 Unspecified atrial fibrillation: Secondary | ICD-10-CM | POA: Diagnosis not present

## 2019-06-27 DIAGNOSIS — K219 Gastro-esophageal reflux disease without esophagitis: Secondary | ICD-10-CM | POA: Diagnosis not present

## 2019-06-27 DIAGNOSIS — D62 Acute posthemorrhagic anemia: Secondary | ICD-10-CM | POA: Diagnosis not present

## 2019-06-27 DIAGNOSIS — D649 Anemia, unspecified: Secondary | ICD-10-CM | POA: Diagnosis not present

## 2019-06-27 DIAGNOSIS — Z4789 Encounter for other orthopedic aftercare: Secondary | ICD-10-CM | POA: Diagnosis not present

## 2019-06-27 DIAGNOSIS — R030 Elevated blood-pressure reading, without diagnosis of hypertension: Secondary | ICD-10-CM | POA: Diagnosis not present

## 2019-06-27 DIAGNOSIS — S72392A Other fracture of shaft of left femur, initial encounter for closed fracture: Secondary | ICD-10-CM | POA: Diagnosis not present

## 2019-06-27 DIAGNOSIS — N179 Acute kidney failure, unspecified: Secondary | ICD-10-CM | POA: Diagnosis not present

## 2019-06-27 DIAGNOSIS — I25810 Atherosclerosis of coronary artery bypass graft(s) without angina pectoris: Secondary | ICD-10-CM | POA: Diagnosis not present

## 2019-06-27 DIAGNOSIS — I2510 Atherosclerotic heart disease of native coronary artery without angina pectoris: Secondary | ICD-10-CM | POA: Diagnosis not present

## 2019-06-27 DIAGNOSIS — E871 Hypo-osmolality and hyponatremia: Secondary | ICD-10-CM | POA: Diagnosis not present

## 2019-06-27 NOTE — Telephone Encounter (Signed)
Per Nevin Bloodgood, the call was a FYI call to inform Dr. Agustin Cree that the patient was taken off eliquis for two days.

## 2019-06-28 DIAGNOSIS — I25810 Atherosclerosis of coronary artery bypass graft(s) without angina pectoris: Secondary | ICD-10-CM | POA: Diagnosis not present

## 2019-06-28 DIAGNOSIS — I4891 Unspecified atrial fibrillation: Secondary | ICD-10-CM | POA: Diagnosis not present

## 2019-06-28 DIAGNOSIS — S72332A Displaced oblique fracture of shaft of left femur, initial encounter for closed fracture: Secondary | ICD-10-CM | POA: Diagnosis not present

## 2019-06-28 DIAGNOSIS — N3000 Acute cystitis without hematuria: Secondary | ICD-10-CM | POA: Diagnosis not present

## 2019-06-29 ENCOUNTER — Other Ambulatory Visit: Payer: Self-pay

## 2019-06-29 DIAGNOSIS — I4891 Unspecified atrial fibrillation: Secondary | ICD-10-CM | POA: Diagnosis not present

## 2019-06-29 DIAGNOSIS — N3000 Acute cystitis without hematuria: Secondary | ICD-10-CM | POA: Diagnosis not present

## 2019-06-29 DIAGNOSIS — I25810 Atherosclerosis of coronary artery bypass graft(s) without angina pectoris: Secondary | ICD-10-CM | POA: Diagnosis not present

## 2019-06-29 DIAGNOSIS — S72332A Displaced oblique fracture of shaft of left femur, initial encounter for closed fracture: Secondary | ICD-10-CM | POA: Diagnosis not present

## 2019-06-29 NOTE — Patient Outreach (Signed)
[facility]) Care Management  06/29/2019  QURON KELTY 02-05-1934 [id number]     Transition of Care Referral  Referral Date: 06/29/2019 Referral Source: [facility] Discharge Report Date of Admission: unknown Diagnosis: unknown Date of Discharge: 06/27/2019 Facility: [place]: [facility]    Outreach attempt # 1 to patient. Spoke with spouse(DPR on file). She voices that patient remains at nursing home facility. She states she does not know when and if patient will be discharged from facility as she has not gotten an update from them.     Plan: RN CM will close case at this time as patient remains at facility.    [name], RN,BSN,CCM [place] [facility] Telephonic Care Management Coordinator Direct Phone: [phone] Toll Free: [phone] Fax: [phone]

## 2019-06-30 DIAGNOSIS — I25810 Atherosclerosis of coronary artery bypass graft(s) without angina pectoris: Secondary | ICD-10-CM | POA: Diagnosis not present

## 2019-06-30 DIAGNOSIS — I4891 Unspecified atrial fibrillation: Secondary | ICD-10-CM | POA: Diagnosis not present

## 2019-06-30 DIAGNOSIS — N3000 Acute cystitis without hematuria: Secondary | ICD-10-CM | POA: Diagnosis not present

## 2019-06-30 DIAGNOSIS — S72332A Displaced oblique fracture of shaft of left femur, initial encounter for closed fracture: Secondary | ICD-10-CM | POA: Diagnosis not present

## 2019-07-01 DIAGNOSIS — S72332A Displaced oblique fracture of shaft of left femur, initial encounter for closed fracture: Secondary | ICD-10-CM | POA: Diagnosis not present

## 2019-07-01 DIAGNOSIS — I4891 Unspecified atrial fibrillation: Secondary | ICD-10-CM | POA: Diagnosis not present

## 2019-07-01 DIAGNOSIS — N3000 Acute cystitis without hematuria: Secondary | ICD-10-CM | POA: Diagnosis not present

## 2019-07-01 DIAGNOSIS — I25810 Atherosclerosis of coronary artery bypass graft(s) without angina pectoris: Secondary | ICD-10-CM | POA: Diagnosis not present

## 2019-07-02 DIAGNOSIS — N3000 Acute cystitis without hematuria: Secondary | ICD-10-CM | POA: Diagnosis not present

## 2019-07-02 DIAGNOSIS — S72332A Displaced oblique fracture of shaft of left femur, initial encounter for closed fracture: Secondary | ICD-10-CM | POA: Diagnosis not present

## 2019-07-02 DIAGNOSIS — I4891 Unspecified atrial fibrillation: Secondary | ICD-10-CM | POA: Diagnosis not present

## 2019-07-02 DIAGNOSIS — I25810 Atherosclerosis of coronary artery bypass graft(s) without angina pectoris: Secondary | ICD-10-CM | POA: Diagnosis not present

## 2019-07-03 DIAGNOSIS — N3000 Acute cystitis without hematuria: Secondary | ICD-10-CM | POA: Diagnosis not present

## 2019-07-03 DIAGNOSIS — I4891 Unspecified atrial fibrillation: Secondary | ICD-10-CM | POA: Diagnosis not present

## 2019-07-03 DIAGNOSIS — S72332A Displaced oblique fracture of shaft of left femur, initial encounter for closed fracture: Secondary | ICD-10-CM | POA: Diagnosis not present

## 2019-07-03 DIAGNOSIS — I25810 Atherosclerosis of coronary artery bypass graft(s) without angina pectoris: Secondary | ICD-10-CM | POA: Diagnosis not present

## 2019-07-04 DIAGNOSIS — I4891 Unspecified atrial fibrillation: Secondary | ICD-10-CM | POA: Diagnosis not present

## 2019-07-04 DIAGNOSIS — I25810 Atherosclerosis of coronary artery bypass graft(s) without angina pectoris: Secondary | ICD-10-CM | POA: Diagnosis not present

## 2019-07-04 DIAGNOSIS — N3000 Acute cystitis without hematuria: Secondary | ICD-10-CM | POA: Diagnosis not present

## 2019-07-04 DIAGNOSIS — S72332A Displaced oblique fracture of shaft of left femur, initial encounter for closed fracture: Secondary | ICD-10-CM | POA: Diagnosis not present

## 2019-07-05 DIAGNOSIS — S72332A Displaced oblique fracture of shaft of left femur, initial encounter for closed fracture: Secondary | ICD-10-CM | POA: Diagnosis not present

## 2019-07-05 DIAGNOSIS — N3000 Acute cystitis without hematuria: Secondary | ICD-10-CM | POA: Diagnosis not present

## 2019-07-05 DIAGNOSIS — I4891 Unspecified atrial fibrillation: Secondary | ICD-10-CM | POA: Diagnosis not present

## 2019-07-05 DIAGNOSIS — I25810 Atherosclerosis of coronary artery bypass graft(s) without angina pectoris: Secondary | ICD-10-CM | POA: Diagnosis not present

## 2019-07-06 DIAGNOSIS — N3000 Acute cystitis without hematuria: Secondary | ICD-10-CM | POA: Diagnosis not present

## 2019-07-06 DIAGNOSIS — S72332A Displaced oblique fracture of shaft of left femur, initial encounter for closed fracture: Secondary | ICD-10-CM | POA: Diagnosis not present

## 2019-07-06 DIAGNOSIS — I4891 Unspecified atrial fibrillation: Secondary | ICD-10-CM | POA: Diagnosis not present

## 2019-07-06 DIAGNOSIS — I25810 Atherosclerosis of coronary artery bypass graft(s) without angina pectoris: Secondary | ICD-10-CM | POA: Diagnosis not present

## 2019-07-07 DIAGNOSIS — N3000 Acute cystitis without hematuria: Secondary | ICD-10-CM | POA: Diagnosis not present

## 2019-07-07 DIAGNOSIS — I25810 Atherosclerosis of coronary artery bypass graft(s) without angina pectoris: Secondary | ICD-10-CM | POA: Diagnosis not present

## 2019-07-07 DIAGNOSIS — S72332A Displaced oblique fracture of shaft of left femur, initial encounter for closed fracture: Secondary | ICD-10-CM | POA: Diagnosis not present

## 2019-07-07 DIAGNOSIS — I4891 Unspecified atrial fibrillation: Secondary | ICD-10-CM | POA: Diagnosis not present

## 2019-07-08 DIAGNOSIS — L89620 Pressure ulcer of left heel, unstageable: Secondary | ICD-10-CM | POA: Diagnosis not present

## 2019-07-08 DIAGNOSIS — L89152 Pressure ulcer of sacral region, stage 2: Secondary | ICD-10-CM | POA: Diagnosis not present

## 2019-07-08 DIAGNOSIS — Z4789 Encounter for other orthopedic aftercare: Secondary | ICD-10-CM | POA: Diagnosis not present

## 2019-07-08 DIAGNOSIS — E871 Hypo-osmolality and hyponatremia: Secondary | ICD-10-CM | POA: Diagnosis not present

## 2019-07-08 DIAGNOSIS — I4891 Unspecified atrial fibrillation: Secondary | ICD-10-CM | POA: Diagnosis not present

## 2019-07-08 DIAGNOSIS — R2689 Other abnormalities of gait and mobility: Secondary | ICD-10-CM | POA: Diagnosis not present

## 2019-07-08 DIAGNOSIS — K219 Gastro-esophageal reflux disease without esophagitis: Secondary | ICD-10-CM | POA: Diagnosis not present

## 2019-07-08 DIAGNOSIS — D649 Anemia, unspecified: Secondary | ICD-10-CM | POA: Diagnosis not present

## 2019-07-08 DIAGNOSIS — S72002D Fracture of unspecified part of neck of left femur, subsequent encounter for closed fracture with routine healing: Secondary | ICD-10-CM | POA: Diagnosis not present

## 2019-07-08 DIAGNOSIS — S7292XD Unspecified fracture of left femur, subsequent encounter for closed fracture with routine healing: Secondary | ICD-10-CM | POA: Diagnosis not present

## 2019-07-08 DIAGNOSIS — I2510 Atherosclerotic heart disease of native coronary artery without angina pectoris: Secondary | ICD-10-CM | POA: Diagnosis not present

## 2019-07-08 DIAGNOSIS — N179 Acute kidney failure, unspecified: Secondary | ICD-10-CM | POA: Diagnosis not present

## 2019-07-08 DIAGNOSIS — Z7401 Bed confinement status: Secondary | ICD-10-CM | POA: Diagnosis not present

## 2019-07-08 DIAGNOSIS — I25810 Atherosclerosis of coronary artery bypass graft(s) without angina pectoris: Secondary | ICD-10-CM | POA: Diagnosis not present

## 2019-07-08 DIAGNOSIS — R531 Weakness: Secondary | ICD-10-CM | POA: Diagnosis not present

## 2019-07-08 DIAGNOSIS — G8918 Other acute postprocedural pain: Secondary | ICD-10-CM | POA: Diagnosis not present

## 2019-07-08 DIAGNOSIS — R030 Elevated blood-pressure reading, without diagnosis of hypertension: Secondary | ICD-10-CM | POA: Diagnosis not present

## 2019-07-08 DIAGNOSIS — S72332A Displaced oblique fracture of shaft of left femur, initial encounter for closed fracture: Secondary | ICD-10-CM | POA: Diagnosis not present

## 2019-07-08 DIAGNOSIS — Z20828 Contact with and (suspected) exposure to other viral communicable diseases: Secondary | ICD-10-CM | POA: Diagnosis not present

## 2019-07-08 DIAGNOSIS — L89622 Pressure ulcer of left heel, stage 2: Secondary | ICD-10-CM | POA: Diagnosis not present

## 2019-07-08 DIAGNOSIS — N3000 Acute cystitis without hematuria: Secondary | ICD-10-CM | POA: Diagnosis not present

## 2019-07-11 DIAGNOSIS — I2510 Atherosclerotic heart disease of native coronary artery without angina pectoris: Secondary | ICD-10-CM | POA: Diagnosis not present

## 2019-07-11 DIAGNOSIS — I4891 Unspecified atrial fibrillation: Secondary | ICD-10-CM | POA: Diagnosis not present

## 2019-07-11 DIAGNOSIS — G8918 Other acute postprocedural pain: Secondary | ICD-10-CM | POA: Diagnosis not present

## 2019-07-11 DIAGNOSIS — S7292XD Unspecified fracture of left femur, subsequent encounter for closed fracture with routine healing: Secondary | ICD-10-CM | POA: Diagnosis not present

## 2019-07-12 DIAGNOSIS — L89152 Pressure ulcer of sacral region, stage 2: Secondary | ICD-10-CM | POA: Diagnosis not present

## 2019-07-12 DIAGNOSIS — L89620 Pressure ulcer of left heel, unstageable: Secondary | ICD-10-CM | POA: Diagnosis not present

## 2019-07-19 DIAGNOSIS — L89152 Pressure ulcer of sacral region, stage 2: Secondary | ICD-10-CM | POA: Diagnosis not present

## 2019-07-19 DIAGNOSIS — L89622 Pressure ulcer of left heel, stage 2: Secondary | ICD-10-CM | POA: Diagnosis not present

## 2019-07-27 ENCOUNTER — Other Ambulatory Visit: Payer: Self-pay

## 2019-07-27 DIAGNOSIS — M80052D Age-related osteoporosis with current pathological fracture, left femur, subsequent encounter for fracture with routine healing: Secondary | ICD-10-CM | POA: Diagnosis not present

## 2019-07-27 DIAGNOSIS — E871 Hypo-osmolality and hyponatremia: Secondary | ICD-10-CM | POA: Diagnosis not present

## 2019-07-27 DIAGNOSIS — I2510 Atherosclerotic heart disease of native coronary artery without angina pectoris: Secondary | ICD-10-CM | POA: Diagnosis not present

## 2019-07-27 DIAGNOSIS — Z7901 Long term (current) use of anticoagulants: Secondary | ICD-10-CM | POA: Diagnosis not present

## 2019-07-27 DIAGNOSIS — I4891 Unspecified atrial fibrillation: Secondary | ICD-10-CM | POA: Diagnosis not present

## 2019-07-27 DIAGNOSIS — N3000 Acute cystitis without hematuria: Secondary | ICD-10-CM | POA: Diagnosis not present

## 2019-07-27 DIAGNOSIS — Z87891 Personal history of nicotine dependence: Secondary | ICD-10-CM | POA: Diagnosis not present

## 2019-07-27 DIAGNOSIS — J449 Chronic obstructive pulmonary disease, unspecified: Secondary | ICD-10-CM | POA: Diagnosis not present

## 2019-07-27 DIAGNOSIS — N179 Acute kidney failure, unspecified: Secondary | ICD-10-CM | POA: Diagnosis not present

## 2019-07-27 NOTE — Patient Outreach (Signed)
[facility]) Care Management  07/27/2019  Cameron Willis [birth year] [id number]     Transition of Care Referral  Referral Date: 07/27/2019 Referral Source: [facility] Discharge Report Date of Discharge: 07/26/2019 Facility: [place]: [facility] Medicare    Outreach attempt #1 to patient. Spoke with patient who denies any acute issues or concerns at present. He voices that he is doing well and glad to be back home. He endorses that he has very supportive spouse in the home. He states that [facility] services are involved as well. Patient confirms that he has all his meds in the home but did not wish to complete med review over the phone. He reports that he plans to call PCP office today to make follow up appt and has family who can take him to appts. [facility] services reviewed and discussed with patient. He voices that he already has a "a lot of people helping him" and politely declined future calls at this time but was appreciative of call.    Plan: RN CM will close case at this time.   [name], RN,BSN,CCM [place] [facility] Telephonic Care Management Coordinator Direct Phone: [phone] Toll Free: [phone] Fax: [phone]

## 2019-07-28 DIAGNOSIS — Z87891 Personal history of nicotine dependence: Secondary | ICD-10-CM | POA: Diagnosis not present

## 2019-07-28 DIAGNOSIS — N3000 Acute cystitis without hematuria: Secondary | ICD-10-CM | POA: Diagnosis not present

## 2019-07-28 DIAGNOSIS — M80052D Age-related osteoporosis with current pathological fracture, left femur, subsequent encounter for fracture with routine healing: Secondary | ICD-10-CM | POA: Diagnosis not present

## 2019-07-28 DIAGNOSIS — N179 Acute kidney failure, unspecified: Secondary | ICD-10-CM | POA: Diagnosis not present

## 2019-07-28 DIAGNOSIS — E871 Hypo-osmolality and hyponatremia: Secondary | ICD-10-CM | POA: Diagnosis not present

## 2019-07-28 DIAGNOSIS — J449 Chronic obstructive pulmonary disease, unspecified: Secondary | ICD-10-CM | POA: Diagnosis not present

## 2019-07-28 DIAGNOSIS — Z7901 Long term (current) use of anticoagulants: Secondary | ICD-10-CM | POA: Diagnosis not present

## 2019-07-28 DIAGNOSIS — I4891 Unspecified atrial fibrillation: Secondary | ICD-10-CM | POA: Diagnosis not present

## 2019-07-28 DIAGNOSIS — I2510 Atherosclerotic heart disease of native coronary artery without angina pectoris: Secondary | ICD-10-CM | POA: Diagnosis not present

## 2019-08-01 DIAGNOSIS — N179 Acute kidney failure, unspecified: Secondary | ICD-10-CM | POA: Diagnosis not present

## 2019-08-01 DIAGNOSIS — I4891 Unspecified atrial fibrillation: Secondary | ICD-10-CM | POA: Diagnosis not present

## 2019-08-01 DIAGNOSIS — S72342D Displaced spiral fracture of shaft of left femur, subsequent encounter for closed fracture with routine healing: Secondary | ICD-10-CM | POA: Diagnosis not present

## 2019-08-01 DIAGNOSIS — M80052D Age-related osteoporosis with current pathological fracture, left femur, subsequent encounter for fracture with routine healing: Secondary | ICD-10-CM | POA: Diagnosis not present

## 2019-08-01 DIAGNOSIS — L89620 Pressure ulcer of left heel, unstageable: Secondary | ICD-10-CM | POA: Diagnosis not present

## 2019-08-01 DIAGNOSIS — N3000 Acute cystitis without hematuria: Secondary | ICD-10-CM | POA: Diagnosis not present

## 2019-08-01 DIAGNOSIS — J449 Chronic obstructive pulmonary disease, unspecified: Secondary | ICD-10-CM | POA: Diagnosis not present

## 2019-08-01 DIAGNOSIS — I2510 Atherosclerotic heart disease of native coronary artery without angina pectoris: Secondary | ICD-10-CM | POA: Diagnosis not present

## 2019-08-01 DIAGNOSIS — I4821 Permanent atrial fibrillation: Secondary | ICD-10-CM | POA: Diagnosis not present

## 2019-08-01 DIAGNOSIS — Z7901 Long term (current) use of anticoagulants: Secondary | ICD-10-CM | POA: Diagnosis not present

## 2019-08-01 DIAGNOSIS — E871 Hypo-osmolality and hyponatremia: Secondary | ICD-10-CM | POA: Diagnosis not present

## 2019-08-01 DIAGNOSIS — Z87891 Personal history of nicotine dependence: Secondary | ICD-10-CM | POA: Diagnosis not present

## 2019-08-01 DIAGNOSIS — I25810 Atherosclerosis of coronary artery bypass graft(s) without angina pectoris: Secondary | ICD-10-CM | POA: Diagnosis not present

## 2019-08-03 DIAGNOSIS — J449 Chronic obstructive pulmonary disease, unspecified: Secondary | ICD-10-CM | POA: Diagnosis not present

## 2019-08-03 DIAGNOSIS — Z87891 Personal history of nicotine dependence: Secondary | ICD-10-CM | POA: Diagnosis not present

## 2019-08-03 DIAGNOSIS — Z7901 Long term (current) use of anticoagulants: Secondary | ICD-10-CM | POA: Diagnosis not present

## 2019-08-03 DIAGNOSIS — N3000 Acute cystitis without hematuria: Secondary | ICD-10-CM | POA: Diagnosis not present

## 2019-08-03 DIAGNOSIS — I4891 Unspecified atrial fibrillation: Secondary | ICD-10-CM | POA: Diagnosis not present

## 2019-08-03 DIAGNOSIS — M80052D Age-related osteoporosis with current pathological fracture, left femur, subsequent encounter for fracture with routine healing: Secondary | ICD-10-CM | POA: Diagnosis not present

## 2019-08-03 DIAGNOSIS — E871 Hypo-osmolality and hyponatremia: Secondary | ICD-10-CM | POA: Diagnosis not present

## 2019-08-03 DIAGNOSIS — I2510 Atherosclerotic heart disease of native coronary artery without angina pectoris: Secondary | ICD-10-CM | POA: Diagnosis not present

## 2019-08-03 DIAGNOSIS — N179 Acute kidney failure, unspecified: Secondary | ICD-10-CM | POA: Diagnosis not present

## 2019-08-04 DIAGNOSIS — I4891 Unspecified atrial fibrillation: Secondary | ICD-10-CM | POA: Diagnosis not present

## 2019-08-04 DIAGNOSIS — N3000 Acute cystitis without hematuria: Secondary | ICD-10-CM | POA: Diagnosis not present

## 2019-08-04 DIAGNOSIS — Z7901 Long term (current) use of anticoagulants: Secondary | ICD-10-CM | POA: Diagnosis not present

## 2019-08-04 DIAGNOSIS — N179 Acute kidney failure, unspecified: Secondary | ICD-10-CM | POA: Diagnosis not present

## 2019-08-04 DIAGNOSIS — E871 Hypo-osmolality and hyponatremia: Secondary | ICD-10-CM | POA: Diagnosis not present

## 2019-08-04 DIAGNOSIS — Z87891 Personal history of nicotine dependence: Secondary | ICD-10-CM | POA: Diagnosis not present

## 2019-08-04 DIAGNOSIS — I2510 Atherosclerotic heart disease of native coronary artery without angina pectoris: Secondary | ICD-10-CM | POA: Diagnosis not present

## 2019-08-04 DIAGNOSIS — J449 Chronic obstructive pulmonary disease, unspecified: Secondary | ICD-10-CM | POA: Diagnosis not present

## 2019-08-04 DIAGNOSIS — M80052D Age-related osteoporosis with current pathological fracture, left femur, subsequent encounter for fracture with routine healing: Secondary | ICD-10-CM | POA: Diagnosis not present

## 2019-08-09 DIAGNOSIS — L89620 Pressure ulcer of left heel, unstageable: Secondary | ICD-10-CM | POA: Diagnosis not present

## 2019-08-09 DIAGNOSIS — J449 Chronic obstructive pulmonary disease, unspecified: Secondary | ICD-10-CM | POA: Diagnosis not present

## 2019-08-09 DIAGNOSIS — N3000 Acute cystitis without hematuria: Secondary | ICD-10-CM | POA: Diagnosis not present

## 2019-08-09 DIAGNOSIS — M80052D Age-related osteoporosis with current pathological fracture, left femur, subsequent encounter for fracture with routine healing: Secondary | ICD-10-CM | POA: Diagnosis not present

## 2019-08-09 DIAGNOSIS — Z87891 Personal history of nicotine dependence: Secondary | ICD-10-CM | POA: Diagnosis not present

## 2019-08-09 DIAGNOSIS — I2510 Atherosclerotic heart disease of native coronary artery without angina pectoris: Secondary | ICD-10-CM | POA: Diagnosis not present

## 2019-08-09 DIAGNOSIS — I4891 Unspecified atrial fibrillation: Secondary | ICD-10-CM | POA: Diagnosis not present

## 2019-08-09 DIAGNOSIS — Z7901 Long term (current) use of anticoagulants: Secondary | ICD-10-CM | POA: Diagnosis not present

## 2019-08-09 DIAGNOSIS — N179 Acute kidney failure, unspecified: Secondary | ICD-10-CM | POA: Diagnosis not present

## 2019-08-09 DIAGNOSIS — E871 Hypo-osmolality and hyponatremia: Secondary | ICD-10-CM | POA: Diagnosis not present

## 2019-08-10 DIAGNOSIS — N179 Acute kidney failure, unspecified: Secondary | ICD-10-CM | POA: Diagnosis not present

## 2019-08-10 DIAGNOSIS — J449 Chronic obstructive pulmonary disease, unspecified: Secondary | ICD-10-CM | POA: Diagnosis not present

## 2019-08-10 DIAGNOSIS — L89620 Pressure ulcer of left heel, unstageable: Secondary | ICD-10-CM | POA: Diagnosis not present

## 2019-08-10 DIAGNOSIS — Z87891 Personal history of nicotine dependence: Secondary | ICD-10-CM | POA: Diagnosis not present

## 2019-08-10 DIAGNOSIS — I2510 Atherosclerotic heart disease of native coronary artery without angina pectoris: Secondary | ICD-10-CM | POA: Diagnosis not present

## 2019-08-10 DIAGNOSIS — E871 Hypo-osmolality and hyponatremia: Secondary | ICD-10-CM | POA: Diagnosis not present

## 2019-08-10 DIAGNOSIS — I4891 Unspecified atrial fibrillation: Secondary | ICD-10-CM | POA: Diagnosis not present

## 2019-08-10 DIAGNOSIS — Z7901 Long term (current) use of anticoagulants: Secondary | ICD-10-CM | POA: Diagnosis not present

## 2019-08-10 DIAGNOSIS — M80052D Age-related osteoporosis with current pathological fracture, left femur, subsequent encounter for fracture with routine healing: Secondary | ICD-10-CM | POA: Diagnosis not present

## 2019-08-11 DIAGNOSIS — L89620 Pressure ulcer of left heel, unstageable: Secondary | ICD-10-CM | POA: Diagnosis not present

## 2019-08-11 DIAGNOSIS — M80052D Age-related osteoporosis with current pathological fracture, left femur, subsequent encounter for fracture with routine healing: Secondary | ICD-10-CM | POA: Diagnosis not present

## 2019-08-11 DIAGNOSIS — Z7901 Long term (current) use of anticoagulants: Secondary | ICD-10-CM | POA: Diagnosis not present

## 2019-08-11 DIAGNOSIS — I2510 Atherosclerotic heart disease of native coronary artery without angina pectoris: Secondary | ICD-10-CM | POA: Diagnosis not present

## 2019-08-11 DIAGNOSIS — J449 Chronic obstructive pulmonary disease, unspecified: Secondary | ICD-10-CM | POA: Diagnosis not present

## 2019-08-11 DIAGNOSIS — E871 Hypo-osmolality and hyponatremia: Secondary | ICD-10-CM | POA: Diagnosis not present

## 2019-08-11 DIAGNOSIS — I4891 Unspecified atrial fibrillation: Secondary | ICD-10-CM | POA: Diagnosis not present

## 2019-08-11 DIAGNOSIS — Z87891 Personal history of nicotine dependence: Secondary | ICD-10-CM | POA: Diagnosis not present

## 2019-08-11 DIAGNOSIS — N179 Acute kidney failure, unspecified: Secondary | ICD-10-CM | POA: Diagnosis not present

## 2019-08-15 ENCOUNTER — Ambulatory Visit (INDEPENDENT_AMBULATORY_CARE_PROVIDER_SITE_OTHER): Payer: Medicare HMO | Admitting: Podiatry

## 2019-08-15 ENCOUNTER — Other Ambulatory Visit: Payer: Self-pay | Admitting: Podiatry

## 2019-08-15 DIAGNOSIS — Z5329 Procedure and treatment not carried out because of patient's decision for other reasons: Secondary | ICD-10-CM

## 2019-08-15 DIAGNOSIS — Z87891 Personal history of nicotine dependence: Secondary | ICD-10-CM | POA: Diagnosis not present

## 2019-08-15 DIAGNOSIS — Z7901 Long term (current) use of anticoagulants: Secondary | ICD-10-CM | POA: Diagnosis not present

## 2019-08-15 DIAGNOSIS — S72335D Nondisplaced oblique fracture of shaft of left femur, subsequent encounter for closed fracture with routine healing: Secondary | ICD-10-CM | POA: Diagnosis not present

## 2019-08-15 DIAGNOSIS — M80052D Age-related osteoporosis with current pathological fracture, left femur, subsequent encounter for fracture with routine healing: Secondary | ICD-10-CM | POA: Diagnosis not present

## 2019-08-15 DIAGNOSIS — J449 Chronic obstructive pulmonary disease, unspecified: Secondary | ICD-10-CM | POA: Diagnosis not present

## 2019-08-15 DIAGNOSIS — L97521 Non-pressure chronic ulcer of other part of left foot limited to breakdown of skin: Secondary | ICD-10-CM

## 2019-08-15 DIAGNOSIS — I4891 Unspecified atrial fibrillation: Secondary | ICD-10-CM | POA: Diagnosis not present

## 2019-08-15 DIAGNOSIS — I2510 Atherosclerotic heart disease of native coronary artery without angina pectoris: Secondary | ICD-10-CM | POA: Diagnosis not present

## 2019-08-15 DIAGNOSIS — N179 Acute kidney failure, unspecified: Secondary | ICD-10-CM | POA: Diagnosis not present

## 2019-08-15 DIAGNOSIS — E871 Hypo-osmolality and hyponatremia: Secondary | ICD-10-CM | POA: Diagnosis not present

## 2019-08-15 DIAGNOSIS — L89620 Pressure ulcer of left heel, unstageable: Secondary | ICD-10-CM | POA: Diagnosis not present

## 2019-08-15 NOTE — Progress Notes (Signed)
No show for appt. 

## 2019-08-16 DIAGNOSIS — L89620 Pressure ulcer of left heel, unstageable: Secondary | ICD-10-CM | POA: Diagnosis not present

## 2019-08-17 DIAGNOSIS — I4891 Unspecified atrial fibrillation: Secondary | ICD-10-CM | POA: Diagnosis not present

## 2019-08-17 DIAGNOSIS — E871 Hypo-osmolality and hyponatremia: Secondary | ICD-10-CM | POA: Diagnosis not present

## 2019-08-17 DIAGNOSIS — M80052D Age-related osteoporosis with current pathological fracture, left femur, subsequent encounter for fracture with routine healing: Secondary | ICD-10-CM | POA: Diagnosis not present

## 2019-08-17 DIAGNOSIS — I2510 Atherosclerotic heart disease of native coronary artery without angina pectoris: Secondary | ICD-10-CM | POA: Diagnosis not present

## 2019-08-17 DIAGNOSIS — N179 Acute kidney failure, unspecified: Secondary | ICD-10-CM | POA: Diagnosis not present

## 2019-08-17 DIAGNOSIS — Z7901 Long term (current) use of anticoagulants: Secondary | ICD-10-CM | POA: Diagnosis not present

## 2019-08-17 DIAGNOSIS — L89620 Pressure ulcer of left heel, unstageable: Secondary | ICD-10-CM | POA: Diagnosis not present

## 2019-08-17 DIAGNOSIS — J449 Chronic obstructive pulmonary disease, unspecified: Secondary | ICD-10-CM | POA: Diagnosis not present

## 2019-08-17 DIAGNOSIS — Z87891 Personal history of nicotine dependence: Secondary | ICD-10-CM | POA: Diagnosis not present

## 2019-08-18 DIAGNOSIS — J449 Chronic obstructive pulmonary disease, unspecified: Secondary | ICD-10-CM | POA: Diagnosis not present

## 2019-08-18 DIAGNOSIS — E871 Hypo-osmolality and hyponatremia: Secondary | ICD-10-CM | POA: Diagnosis not present

## 2019-08-18 DIAGNOSIS — Z87891 Personal history of nicotine dependence: Secondary | ICD-10-CM | POA: Diagnosis not present

## 2019-08-18 DIAGNOSIS — Z7901 Long term (current) use of anticoagulants: Secondary | ICD-10-CM | POA: Diagnosis not present

## 2019-08-18 DIAGNOSIS — I4891 Unspecified atrial fibrillation: Secondary | ICD-10-CM | POA: Diagnosis not present

## 2019-08-18 DIAGNOSIS — M80052D Age-related osteoporosis with current pathological fracture, left femur, subsequent encounter for fracture with routine healing: Secondary | ICD-10-CM | POA: Diagnosis not present

## 2019-08-18 DIAGNOSIS — L89620 Pressure ulcer of left heel, unstageable: Secondary | ICD-10-CM | POA: Diagnosis not present

## 2019-08-18 DIAGNOSIS — N179 Acute kidney failure, unspecified: Secondary | ICD-10-CM | POA: Diagnosis not present

## 2019-08-18 DIAGNOSIS — I2510 Atherosclerotic heart disease of native coronary artery without angina pectoris: Secondary | ICD-10-CM | POA: Diagnosis not present

## 2019-08-19 DIAGNOSIS — L89620 Pressure ulcer of left heel, unstageable: Secondary | ICD-10-CM | POA: Diagnosis not present

## 2019-08-19 DIAGNOSIS — M80052D Age-related osteoporosis with current pathological fracture, left femur, subsequent encounter for fracture with routine healing: Secondary | ICD-10-CM | POA: Diagnosis not present

## 2019-08-19 DIAGNOSIS — E871 Hypo-osmolality and hyponatremia: Secondary | ICD-10-CM | POA: Diagnosis not present

## 2019-08-19 DIAGNOSIS — Z87891 Personal history of nicotine dependence: Secondary | ICD-10-CM | POA: Diagnosis not present

## 2019-08-19 DIAGNOSIS — I4891 Unspecified atrial fibrillation: Secondary | ICD-10-CM | POA: Diagnosis not present

## 2019-08-19 DIAGNOSIS — N179 Acute kidney failure, unspecified: Secondary | ICD-10-CM | POA: Diagnosis not present

## 2019-08-19 DIAGNOSIS — I2510 Atherosclerotic heart disease of native coronary artery without angina pectoris: Secondary | ICD-10-CM | POA: Diagnosis not present

## 2019-08-19 DIAGNOSIS — Z7901 Long term (current) use of anticoagulants: Secondary | ICD-10-CM | POA: Diagnosis not present

## 2019-08-19 DIAGNOSIS — J449 Chronic obstructive pulmonary disease, unspecified: Secondary | ICD-10-CM | POA: Diagnosis not present

## 2019-08-20 DIAGNOSIS — L89620 Pressure ulcer of left heel, unstageable: Secondary | ICD-10-CM | POA: Diagnosis not present

## 2019-08-20 DIAGNOSIS — Z7901 Long term (current) use of anticoagulants: Secondary | ICD-10-CM | POA: Diagnosis not present

## 2019-08-20 DIAGNOSIS — M80052D Age-related osteoporosis with current pathological fracture, left femur, subsequent encounter for fracture with routine healing: Secondary | ICD-10-CM | POA: Diagnosis not present

## 2019-08-20 DIAGNOSIS — E871 Hypo-osmolality and hyponatremia: Secondary | ICD-10-CM | POA: Diagnosis not present

## 2019-08-20 DIAGNOSIS — I4891 Unspecified atrial fibrillation: Secondary | ICD-10-CM | POA: Diagnosis not present

## 2019-08-20 DIAGNOSIS — Z87891 Personal history of nicotine dependence: Secondary | ICD-10-CM | POA: Diagnosis not present

## 2019-08-20 DIAGNOSIS — I2510 Atherosclerotic heart disease of native coronary artery without angina pectoris: Secondary | ICD-10-CM | POA: Diagnosis not present

## 2019-08-20 DIAGNOSIS — J449 Chronic obstructive pulmonary disease, unspecified: Secondary | ICD-10-CM | POA: Diagnosis not present

## 2019-08-20 DIAGNOSIS — N179 Acute kidney failure, unspecified: Secondary | ICD-10-CM | POA: Diagnosis not present

## 2019-08-21 DIAGNOSIS — L89620 Pressure ulcer of left heel, unstageable: Secondary | ICD-10-CM | POA: Diagnosis not present

## 2019-08-21 DIAGNOSIS — J449 Chronic obstructive pulmonary disease, unspecified: Secondary | ICD-10-CM | POA: Diagnosis not present

## 2019-08-21 DIAGNOSIS — N179 Acute kidney failure, unspecified: Secondary | ICD-10-CM | POA: Diagnosis not present

## 2019-08-21 DIAGNOSIS — E871 Hypo-osmolality and hyponatremia: Secondary | ICD-10-CM | POA: Diagnosis not present

## 2019-08-21 DIAGNOSIS — I2510 Atherosclerotic heart disease of native coronary artery without angina pectoris: Secondary | ICD-10-CM | POA: Diagnosis not present

## 2019-08-21 DIAGNOSIS — Z87891 Personal history of nicotine dependence: Secondary | ICD-10-CM | POA: Diagnosis not present

## 2019-08-21 DIAGNOSIS — M80052D Age-related osteoporosis with current pathological fracture, left femur, subsequent encounter for fracture with routine healing: Secondary | ICD-10-CM | POA: Diagnosis not present

## 2019-08-21 DIAGNOSIS — Z7901 Long term (current) use of anticoagulants: Secondary | ICD-10-CM | POA: Diagnosis not present

## 2019-08-21 DIAGNOSIS — I4891 Unspecified atrial fibrillation: Secondary | ICD-10-CM | POA: Diagnosis not present

## 2019-08-22 DIAGNOSIS — L89620 Pressure ulcer of left heel, unstageable: Secondary | ICD-10-CM | POA: Diagnosis not present

## 2019-08-22 DIAGNOSIS — G8220 Paraplegia, unspecified: Secondary | ICD-10-CM | POA: Diagnosis not present

## 2019-08-23 DIAGNOSIS — J449 Chronic obstructive pulmonary disease, unspecified: Secondary | ICD-10-CM | POA: Diagnosis not present

## 2019-08-23 DIAGNOSIS — L89620 Pressure ulcer of left heel, unstageable: Secondary | ICD-10-CM | POA: Diagnosis not present

## 2019-08-23 DIAGNOSIS — Z7901 Long term (current) use of anticoagulants: Secondary | ICD-10-CM | POA: Diagnosis not present

## 2019-08-23 DIAGNOSIS — N179 Acute kidney failure, unspecified: Secondary | ICD-10-CM | POA: Diagnosis not present

## 2019-08-23 DIAGNOSIS — M80052D Age-related osteoporosis with current pathological fracture, left femur, subsequent encounter for fracture with routine healing: Secondary | ICD-10-CM | POA: Diagnosis not present

## 2019-08-23 DIAGNOSIS — I4891 Unspecified atrial fibrillation: Secondary | ICD-10-CM | POA: Diagnosis not present

## 2019-08-23 DIAGNOSIS — E871 Hypo-osmolality and hyponatremia: Secondary | ICD-10-CM | POA: Diagnosis not present

## 2019-08-23 DIAGNOSIS — Z87891 Personal history of nicotine dependence: Secondary | ICD-10-CM | POA: Diagnosis not present

## 2019-08-23 DIAGNOSIS — I2510 Atherosclerotic heart disease of native coronary artery without angina pectoris: Secondary | ICD-10-CM | POA: Diagnosis not present

## 2019-08-24 DIAGNOSIS — Z87891 Personal history of nicotine dependence: Secondary | ICD-10-CM | POA: Diagnosis not present

## 2019-08-24 DIAGNOSIS — J449 Chronic obstructive pulmonary disease, unspecified: Secondary | ICD-10-CM | POA: Diagnosis not present

## 2019-08-24 DIAGNOSIS — M80052D Age-related osteoporosis with current pathological fracture, left femur, subsequent encounter for fracture with routine healing: Secondary | ICD-10-CM | POA: Diagnosis not present

## 2019-08-24 DIAGNOSIS — Z7901 Long term (current) use of anticoagulants: Secondary | ICD-10-CM | POA: Diagnosis not present

## 2019-08-24 DIAGNOSIS — N179 Acute kidney failure, unspecified: Secondary | ICD-10-CM | POA: Diagnosis not present

## 2019-08-24 DIAGNOSIS — I4891 Unspecified atrial fibrillation: Secondary | ICD-10-CM | POA: Diagnosis not present

## 2019-08-24 DIAGNOSIS — I2510 Atherosclerotic heart disease of native coronary artery without angina pectoris: Secondary | ICD-10-CM | POA: Diagnosis not present

## 2019-08-24 DIAGNOSIS — E871 Hypo-osmolality and hyponatremia: Secondary | ICD-10-CM | POA: Diagnosis not present

## 2019-08-24 DIAGNOSIS — L89620 Pressure ulcer of left heel, unstageable: Secondary | ICD-10-CM | POA: Diagnosis not present

## 2019-08-25 DIAGNOSIS — I4891 Unspecified atrial fibrillation: Secondary | ICD-10-CM | POA: Diagnosis not present

## 2019-08-25 DIAGNOSIS — E871 Hypo-osmolality and hyponatremia: Secondary | ICD-10-CM | POA: Diagnosis not present

## 2019-08-25 DIAGNOSIS — L89620 Pressure ulcer of left heel, unstageable: Secondary | ICD-10-CM | POA: Diagnosis not present

## 2019-08-25 DIAGNOSIS — Z87891 Personal history of nicotine dependence: Secondary | ICD-10-CM | POA: Diagnosis not present

## 2019-08-25 DIAGNOSIS — I2510 Atherosclerotic heart disease of native coronary artery without angina pectoris: Secondary | ICD-10-CM | POA: Diagnosis not present

## 2019-08-25 DIAGNOSIS — J449 Chronic obstructive pulmonary disease, unspecified: Secondary | ICD-10-CM | POA: Diagnosis not present

## 2019-08-25 DIAGNOSIS — M80052D Age-related osteoporosis with current pathological fracture, left femur, subsequent encounter for fracture with routine healing: Secondary | ICD-10-CM | POA: Diagnosis not present

## 2019-08-25 DIAGNOSIS — N179 Acute kidney failure, unspecified: Secondary | ICD-10-CM | POA: Diagnosis not present

## 2019-08-25 DIAGNOSIS — Z7901 Long term (current) use of anticoagulants: Secondary | ICD-10-CM | POA: Diagnosis not present

## 2019-08-26 DIAGNOSIS — J449 Chronic obstructive pulmonary disease, unspecified: Secondary | ICD-10-CM | POA: Diagnosis not present

## 2019-08-26 DIAGNOSIS — E871 Hypo-osmolality and hyponatremia: Secondary | ICD-10-CM | POA: Diagnosis not present

## 2019-08-26 DIAGNOSIS — I4891 Unspecified atrial fibrillation: Secondary | ICD-10-CM | POA: Diagnosis not present

## 2019-08-26 DIAGNOSIS — I2510 Atherosclerotic heart disease of native coronary artery without angina pectoris: Secondary | ICD-10-CM | POA: Diagnosis not present

## 2019-08-26 DIAGNOSIS — M80052D Age-related osteoporosis with current pathological fracture, left femur, subsequent encounter for fracture with routine healing: Secondary | ICD-10-CM | POA: Diagnosis not present

## 2019-08-26 DIAGNOSIS — L89620 Pressure ulcer of left heel, unstageable: Secondary | ICD-10-CM | POA: Diagnosis not present

## 2019-08-26 DIAGNOSIS — N179 Acute kidney failure, unspecified: Secondary | ICD-10-CM | POA: Diagnosis not present

## 2019-08-26 DIAGNOSIS — Z7901 Long term (current) use of anticoagulants: Secondary | ICD-10-CM | POA: Diagnosis not present

## 2019-08-26 DIAGNOSIS — Z87891 Personal history of nicotine dependence: Secondary | ICD-10-CM | POA: Diagnosis not present

## 2019-08-27 DIAGNOSIS — M80052D Age-related osteoporosis with current pathological fracture, left femur, subsequent encounter for fracture with routine healing: Secondary | ICD-10-CM | POA: Diagnosis not present

## 2019-08-27 DIAGNOSIS — N179 Acute kidney failure, unspecified: Secondary | ICD-10-CM | POA: Diagnosis not present

## 2019-08-27 DIAGNOSIS — L89620 Pressure ulcer of left heel, unstageable: Secondary | ICD-10-CM | POA: Diagnosis not present

## 2019-08-27 DIAGNOSIS — E871 Hypo-osmolality and hyponatremia: Secondary | ICD-10-CM | POA: Diagnosis not present

## 2019-08-27 DIAGNOSIS — Z7901 Long term (current) use of anticoagulants: Secondary | ICD-10-CM | POA: Diagnosis not present

## 2019-08-27 DIAGNOSIS — Z87891 Personal history of nicotine dependence: Secondary | ICD-10-CM | POA: Diagnosis not present

## 2019-08-27 DIAGNOSIS — J449 Chronic obstructive pulmonary disease, unspecified: Secondary | ICD-10-CM | POA: Diagnosis not present

## 2019-08-27 DIAGNOSIS — I2510 Atherosclerotic heart disease of native coronary artery without angina pectoris: Secondary | ICD-10-CM | POA: Diagnosis not present

## 2019-08-27 DIAGNOSIS — I4891 Unspecified atrial fibrillation: Secondary | ICD-10-CM | POA: Diagnosis not present

## 2019-08-28 DIAGNOSIS — I2510 Atherosclerotic heart disease of native coronary artery without angina pectoris: Secondary | ICD-10-CM | POA: Diagnosis not present

## 2019-08-28 DIAGNOSIS — E871 Hypo-osmolality and hyponatremia: Secondary | ICD-10-CM | POA: Diagnosis not present

## 2019-08-28 DIAGNOSIS — Z87891 Personal history of nicotine dependence: Secondary | ICD-10-CM | POA: Diagnosis not present

## 2019-08-28 DIAGNOSIS — N179 Acute kidney failure, unspecified: Secondary | ICD-10-CM | POA: Diagnosis not present

## 2019-08-28 DIAGNOSIS — L89620 Pressure ulcer of left heel, unstageable: Secondary | ICD-10-CM | POA: Diagnosis not present

## 2019-08-28 DIAGNOSIS — I4891 Unspecified atrial fibrillation: Secondary | ICD-10-CM | POA: Diagnosis not present

## 2019-08-28 DIAGNOSIS — J449 Chronic obstructive pulmonary disease, unspecified: Secondary | ICD-10-CM | POA: Diagnosis not present

## 2019-08-28 DIAGNOSIS — Z7901 Long term (current) use of anticoagulants: Secondary | ICD-10-CM | POA: Diagnosis not present

## 2019-08-28 DIAGNOSIS — M80052D Age-related osteoporosis with current pathological fracture, left femur, subsequent encounter for fracture with routine healing: Secondary | ICD-10-CM | POA: Diagnosis not present

## 2019-08-29 DIAGNOSIS — L89620 Pressure ulcer of left heel, unstageable: Secondary | ICD-10-CM | POA: Diagnosis not present

## 2019-08-29 DIAGNOSIS — G8220 Paraplegia, unspecified: Secondary | ICD-10-CM | POA: Diagnosis not present

## 2019-08-29 DIAGNOSIS — I10 Essential (primary) hypertension: Secondary | ICD-10-CM | POA: Diagnosis not present

## 2019-08-30 DIAGNOSIS — N179 Acute kidney failure, unspecified: Secondary | ICD-10-CM | POA: Diagnosis not present

## 2019-08-30 DIAGNOSIS — Z7901 Long term (current) use of anticoagulants: Secondary | ICD-10-CM | POA: Diagnosis not present

## 2019-08-30 DIAGNOSIS — I2510 Atherosclerotic heart disease of native coronary artery without angina pectoris: Secondary | ICD-10-CM | POA: Diagnosis not present

## 2019-08-30 DIAGNOSIS — Z87891 Personal history of nicotine dependence: Secondary | ICD-10-CM | POA: Diagnosis not present

## 2019-08-30 DIAGNOSIS — L89620 Pressure ulcer of left heel, unstageable: Secondary | ICD-10-CM | POA: Diagnosis not present

## 2019-08-30 DIAGNOSIS — M80052D Age-related osteoporosis with current pathological fracture, left femur, subsequent encounter for fracture with routine healing: Secondary | ICD-10-CM | POA: Diagnosis not present

## 2019-08-30 DIAGNOSIS — J449 Chronic obstructive pulmonary disease, unspecified: Secondary | ICD-10-CM | POA: Diagnosis not present

## 2019-08-30 DIAGNOSIS — I4891 Unspecified atrial fibrillation: Secondary | ICD-10-CM | POA: Diagnosis not present

## 2019-08-30 DIAGNOSIS — E871 Hypo-osmolality and hyponatremia: Secondary | ICD-10-CM | POA: Diagnosis not present

## 2019-08-31 DIAGNOSIS — E871 Hypo-osmolality and hyponatremia: Secondary | ICD-10-CM | POA: Diagnosis not present

## 2019-08-31 DIAGNOSIS — L89620 Pressure ulcer of left heel, unstageable: Secondary | ICD-10-CM | POA: Diagnosis not present

## 2019-08-31 DIAGNOSIS — N179 Acute kidney failure, unspecified: Secondary | ICD-10-CM | POA: Diagnosis not present

## 2019-08-31 DIAGNOSIS — Z87891 Personal history of nicotine dependence: Secondary | ICD-10-CM | POA: Diagnosis not present

## 2019-08-31 DIAGNOSIS — M80052D Age-related osteoporosis with current pathological fracture, left femur, subsequent encounter for fracture with routine healing: Secondary | ICD-10-CM | POA: Diagnosis not present

## 2019-08-31 DIAGNOSIS — Z7901 Long term (current) use of anticoagulants: Secondary | ICD-10-CM | POA: Diagnosis not present

## 2019-08-31 DIAGNOSIS — J449 Chronic obstructive pulmonary disease, unspecified: Secondary | ICD-10-CM | POA: Diagnosis not present

## 2019-08-31 DIAGNOSIS — I4891 Unspecified atrial fibrillation: Secondary | ICD-10-CM | POA: Diagnosis not present

## 2019-08-31 DIAGNOSIS — I2510 Atherosclerotic heart disease of native coronary artery without angina pectoris: Secondary | ICD-10-CM | POA: Diagnosis not present

## 2019-09-01 DIAGNOSIS — E871 Hypo-osmolality and hyponatremia: Secondary | ICD-10-CM | POA: Diagnosis not present

## 2019-09-01 DIAGNOSIS — Z7901 Long term (current) use of anticoagulants: Secondary | ICD-10-CM | POA: Diagnosis not present

## 2019-09-01 DIAGNOSIS — N179 Acute kidney failure, unspecified: Secondary | ICD-10-CM | POA: Diagnosis not present

## 2019-09-01 DIAGNOSIS — J449 Chronic obstructive pulmonary disease, unspecified: Secondary | ICD-10-CM | POA: Diagnosis not present

## 2019-09-01 DIAGNOSIS — M80052D Age-related osteoporosis with current pathological fracture, left femur, subsequent encounter for fracture with routine healing: Secondary | ICD-10-CM | POA: Diagnosis not present

## 2019-09-01 DIAGNOSIS — L89620 Pressure ulcer of left heel, unstageable: Secondary | ICD-10-CM | POA: Diagnosis not present

## 2019-09-01 DIAGNOSIS — Z87891 Personal history of nicotine dependence: Secondary | ICD-10-CM | POA: Diagnosis not present

## 2019-09-01 DIAGNOSIS — I2510 Atherosclerotic heart disease of native coronary artery without angina pectoris: Secondary | ICD-10-CM | POA: Diagnosis not present

## 2019-09-01 DIAGNOSIS — I4891 Unspecified atrial fibrillation: Secondary | ICD-10-CM | POA: Diagnosis not present

## 2019-09-02 DIAGNOSIS — N179 Acute kidney failure, unspecified: Secondary | ICD-10-CM | POA: Diagnosis not present

## 2019-09-02 DIAGNOSIS — I2510 Atherosclerotic heart disease of native coronary artery without angina pectoris: Secondary | ICD-10-CM | POA: Diagnosis not present

## 2019-09-02 DIAGNOSIS — Z87891 Personal history of nicotine dependence: Secondary | ICD-10-CM | POA: Diagnosis not present

## 2019-09-02 DIAGNOSIS — I4891 Unspecified atrial fibrillation: Secondary | ICD-10-CM | POA: Diagnosis not present

## 2019-09-02 DIAGNOSIS — Z7901 Long term (current) use of anticoagulants: Secondary | ICD-10-CM | POA: Diagnosis not present

## 2019-09-02 DIAGNOSIS — M80052D Age-related osteoporosis with current pathological fracture, left femur, subsequent encounter for fracture with routine healing: Secondary | ICD-10-CM | POA: Diagnosis not present

## 2019-09-02 DIAGNOSIS — J449 Chronic obstructive pulmonary disease, unspecified: Secondary | ICD-10-CM | POA: Diagnosis not present

## 2019-09-02 DIAGNOSIS — E871 Hypo-osmolality and hyponatremia: Secondary | ICD-10-CM | POA: Diagnosis not present

## 2019-09-02 DIAGNOSIS — L89620 Pressure ulcer of left heel, unstageable: Secondary | ICD-10-CM | POA: Diagnosis not present

## 2019-09-05 DIAGNOSIS — I10 Essential (primary) hypertension: Secondary | ICD-10-CM | POA: Diagnosis not present

## 2019-09-05 DIAGNOSIS — M80052D Age-related osteoporosis with current pathological fracture, left femur, subsequent encounter for fracture with routine healing: Secondary | ICD-10-CM | POA: Diagnosis not present

## 2019-09-05 DIAGNOSIS — N179 Acute kidney failure, unspecified: Secondary | ICD-10-CM | POA: Diagnosis not present

## 2019-09-05 DIAGNOSIS — I4891 Unspecified atrial fibrillation: Secondary | ICD-10-CM | POA: Diagnosis not present

## 2019-09-05 DIAGNOSIS — E871 Hypo-osmolality and hyponatremia: Secondary | ICD-10-CM | POA: Diagnosis not present

## 2019-09-05 DIAGNOSIS — Z87891 Personal history of nicotine dependence: Secondary | ICD-10-CM | POA: Diagnosis not present

## 2019-09-05 DIAGNOSIS — J449 Chronic obstructive pulmonary disease, unspecified: Secondary | ICD-10-CM | POA: Diagnosis not present

## 2019-09-05 DIAGNOSIS — Z7901 Long term (current) use of anticoagulants: Secondary | ICD-10-CM | POA: Diagnosis not present

## 2019-09-05 DIAGNOSIS — L89620 Pressure ulcer of left heel, unstageable: Secondary | ICD-10-CM | POA: Diagnosis not present

## 2019-09-05 DIAGNOSIS — G8220 Paraplegia, unspecified: Secondary | ICD-10-CM | POA: Diagnosis not present

## 2019-09-05 DIAGNOSIS — I2510 Atherosclerotic heart disease of native coronary artery without angina pectoris: Secondary | ICD-10-CM | POA: Diagnosis not present

## 2019-09-06 DIAGNOSIS — M80052D Age-related osteoporosis with current pathological fracture, left femur, subsequent encounter for fracture with routine healing: Secondary | ICD-10-CM | POA: Diagnosis not present

## 2019-09-06 DIAGNOSIS — E871 Hypo-osmolality and hyponatremia: Secondary | ICD-10-CM | POA: Diagnosis not present

## 2019-09-06 DIAGNOSIS — I4891 Unspecified atrial fibrillation: Secondary | ICD-10-CM | POA: Diagnosis not present

## 2019-09-06 DIAGNOSIS — Z7901 Long term (current) use of anticoagulants: Secondary | ICD-10-CM | POA: Diagnosis not present

## 2019-09-06 DIAGNOSIS — Z87891 Personal history of nicotine dependence: Secondary | ICD-10-CM | POA: Diagnosis not present

## 2019-09-06 DIAGNOSIS — I2510 Atherosclerotic heart disease of native coronary artery without angina pectoris: Secondary | ICD-10-CM | POA: Diagnosis not present

## 2019-09-06 DIAGNOSIS — J449 Chronic obstructive pulmonary disease, unspecified: Secondary | ICD-10-CM | POA: Diagnosis not present

## 2019-09-06 DIAGNOSIS — L89620 Pressure ulcer of left heel, unstageable: Secondary | ICD-10-CM | POA: Diagnosis not present

## 2019-09-06 DIAGNOSIS — N179 Acute kidney failure, unspecified: Secondary | ICD-10-CM | POA: Diagnosis not present

## 2019-09-07 DIAGNOSIS — M80052D Age-related osteoporosis with current pathological fracture, left femur, subsequent encounter for fracture with routine healing: Secondary | ICD-10-CM | POA: Diagnosis not present

## 2019-09-07 DIAGNOSIS — J449 Chronic obstructive pulmonary disease, unspecified: Secondary | ICD-10-CM | POA: Diagnosis not present

## 2019-09-07 DIAGNOSIS — E871 Hypo-osmolality and hyponatremia: Secondary | ICD-10-CM | POA: Diagnosis not present

## 2019-09-07 DIAGNOSIS — I2510 Atherosclerotic heart disease of native coronary artery without angina pectoris: Secondary | ICD-10-CM | POA: Diagnosis not present

## 2019-09-07 DIAGNOSIS — L89620 Pressure ulcer of left heel, unstageable: Secondary | ICD-10-CM | POA: Diagnosis not present

## 2019-09-07 DIAGNOSIS — N179 Acute kidney failure, unspecified: Secondary | ICD-10-CM | POA: Diagnosis not present

## 2019-09-07 DIAGNOSIS — Z87891 Personal history of nicotine dependence: Secondary | ICD-10-CM | POA: Diagnosis not present

## 2019-09-07 DIAGNOSIS — I4891 Unspecified atrial fibrillation: Secondary | ICD-10-CM | POA: Diagnosis not present

## 2019-09-07 DIAGNOSIS — Z7901 Long term (current) use of anticoagulants: Secondary | ICD-10-CM | POA: Diagnosis not present

## 2019-09-08 DIAGNOSIS — L89310 Pressure ulcer of right buttock, unstageable: Secondary | ICD-10-CM | POA: Diagnosis not present

## 2019-09-08 DIAGNOSIS — Z7901 Long term (current) use of anticoagulants: Secondary | ICD-10-CM | POA: Diagnosis not present

## 2019-09-08 DIAGNOSIS — I4891 Unspecified atrial fibrillation: Secondary | ICD-10-CM | POA: Diagnosis not present

## 2019-09-08 DIAGNOSIS — M80052D Age-related osteoporosis with current pathological fracture, left femur, subsequent encounter for fracture with routine healing: Secondary | ICD-10-CM | POA: Diagnosis not present

## 2019-09-08 DIAGNOSIS — E871 Hypo-osmolality and hyponatremia: Secondary | ICD-10-CM | POA: Diagnosis not present

## 2019-09-08 DIAGNOSIS — I2510 Atherosclerotic heart disease of native coronary artery without angina pectoris: Secondary | ICD-10-CM | POA: Diagnosis not present

## 2019-09-08 DIAGNOSIS — J449 Chronic obstructive pulmonary disease, unspecified: Secondary | ICD-10-CM | POA: Diagnosis not present

## 2019-09-08 DIAGNOSIS — Z87891 Personal history of nicotine dependence: Secondary | ICD-10-CM | POA: Diagnosis not present

## 2019-09-08 DIAGNOSIS — N179 Acute kidney failure, unspecified: Secondary | ICD-10-CM | POA: Diagnosis not present

## 2019-09-08 DIAGNOSIS — L89620 Pressure ulcer of left heel, unstageable: Secondary | ICD-10-CM | POA: Diagnosis not present

## 2019-09-09 DIAGNOSIS — Z87891 Personal history of nicotine dependence: Secondary | ICD-10-CM | POA: Diagnosis not present

## 2019-09-09 DIAGNOSIS — E871 Hypo-osmolality and hyponatremia: Secondary | ICD-10-CM | POA: Diagnosis not present

## 2019-09-09 DIAGNOSIS — J449 Chronic obstructive pulmonary disease, unspecified: Secondary | ICD-10-CM | POA: Diagnosis not present

## 2019-09-09 DIAGNOSIS — I2510 Atherosclerotic heart disease of native coronary artery without angina pectoris: Secondary | ICD-10-CM | POA: Diagnosis not present

## 2019-09-09 DIAGNOSIS — M80052D Age-related osteoporosis with current pathological fracture, left femur, subsequent encounter for fracture with routine healing: Secondary | ICD-10-CM | POA: Diagnosis not present

## 2019-09-09 DIAGNOSIS — L89620 Pressure ulcer of left heel, unstageable: Secondary | ICD-10-CM | POA: Diagnosis not present

## 2019-09-09 DIAGNOSIS — I4891 Unspecified atrial fibrillation: Secondary | ICD-10-CM | POA: Diagnosis not present

## 2019-09-09 DIAGNOSIS — N179 Acute kidney failure, unspecified: Secondary | ICD-10-CM | POA: Diagnosis not present

## 2019-09-09 DIAGNOSIS — Z7901 Long term (current) use of anticoagulants: Secondary | ICD-10-CM | POA: Diagnosis not present

## 2019-09-12 DIAGNOSIS — M80052D Age-related osteoporosis with current pathological fracture, left femur, subsequent encounter for fracture with routine healing: Secondary | ICD-10-CM | POA: Diagnosis not present

## 2019-09-12 DIAGNOSIS — N179 Acute kidney failure, unspecified: Secondary | ICD-10-CM | POA: Diagnosis not present

## 2019-09-12 DIAGNOSIS — I4891 Unspecified atrial fibrillation: Secondary | ICD-10-CM | POA: Diagnosis not present

## 2019-09-12 DIAGNOSIS — Z87891 Personal history of nicotine dependence: Secondary | ICD-10-CM | POA: Diagnosis not present

## 2019-09-12 DIAGNOSIS — I2510 Atherosclerotic heart disease of native coronary artery without angina pectoris: Secondary | ICD-10-CM | POA: Diagnosis not present

## 2019-09-12 DIAGNOSIS — E871 Hypo-osmolality and hyponatremia: Secondary | ICD-10-CM | POA: Diagnosis not present

## 2019-09-12 DIAGNOSIS — J449 Chronic obstructive pulmonary disease, unspecified: Secondary | ICD-10-CM | POA: Diagnosis not present

## 2019-09-12 DIAGNOSIS — L89620 Pressure ulcer of left heel, unstageable: Secondary | ICD-10-CM | POA: Diagnosis not present

## 2019-09-12 DIAGNOSIS — Z7901 Long term (current) use of anticoagulants: Secondary | ICD-10-CM | POA: Diagnosis not present

## 2019-09-14 DIAGNOSIS — I4891 Unspecified atrial fibrillation: Secondary | ICD-10-CM | POA: Diagnosis not present

## 2019-09-14 DIAGNOSIS — E871 Hypo-osmolality and hyponatremia: Secondary | ICD-10-CM | POA: Diagnosis not present

## 2019-09-14 DIAGNOSIS — J449 Chronic obstructive pulmonary disease, unspecified: Secondary | ICD-10-CM | POA: Diagnosis not present

## 2019-09-14 DIAGNOSIS — N179 Acute kidney failure, unspecified: Secondary | ICD-10-CM | POA: Diagnosis not present

## 2019-09-14 DIAGNOSIS — Z87891 Personal history of nicotine dependence: Secondary | ICD-10-CM | POA: Diagnosis not present

## 2019-09-14 DIAGNOSIS — M80052D Age-related osteoporosis with current pathological fracture, left femur, subsequent encounter for fracture with routine healing: Secondary | ICD-10-CM | POA: Diagnosis not present

## 2019-09-14 DIAGNOSIS — Z7901 Long term (current) use of anticoagulants: Secondary | ICD-10-CM | POA: Diagnosis not present

## 2019-09-14 DIAGNOSIS — L89620 Pressure ulcer of left heel, unstageable: Secondary | ICD-10-CM | POA: Diagnosis not present

## 2019-09-14 DIAGNOSIS — I2510 Atherosclerotic heart disease of native coronary artery without angina pectoris: Secondary | ICD-10-CM | POA: Diagnosis not present

## 2019-09-16 DIAGNOSIS — I4891 Unspecified atrial fibrillation: Secondary | ICD-10-CM | POA: Diagnosis not present

## 2019-09-16 DIAGNOSIS — Z87891 Personal history of nicotine dependence: Secondary | ICD-10-CM | POA: Diagnosis not present

## 2019-09-16 DIAGNOSIS — M80052D Age-related osteoporosis with current pathological fracture, left femur, subsequent encounter for fracture with routine healing: Secondary | ICD-10-CM | POA: Diagnosis not present

## 2019-09-16 DIAGNOSIS — E871 Hypo-osmolality and hyponatremia: Secondary | ICD-10-CM | POA: Diagnosis not present

## 2019-09-16 DIAGNOSIS — L89620 Pressure ulcer of left heel, unstageable: Secondary | ICD-10-CM | POA: Diagnosis not present

## 2019-09-16 DIAGNOSIS — Z7901 Long term (current) use of anticoagulants: Secondary | ICD-10-CM | POA: Diagnosis not present

## 2019-09-16 DIAGNOSIS — N179 Acute kidney failure, unspecified: Secondary | ICD-10-CM | POA: Diagnosis not present

## 2019-09-16 DIAGNOSIS — J449 Chronic obstructive pulmonary disease, unspecified: Secondary | ICD-10-CM | POA: Diagnosis not present

## 2019-09-16 DIAGNOSIS — I2510 Atherosclerotic heart disease of native coronary artery without angina pectoris: Secondary | ICD-10-CM | POA: Diagnosis not present

## 2019-09-19 DIAGNOSIS — I10 Essential (primary) hypertension: Secondary | ICD-10-CM | POA: Diagnosis not present

## 2019-09-19 DIAGNOSIS — E871 Hypo-osmolality and hyponatremia: Secondary | ICD-10-CM | POA: Diagnosis not present

## 2019-09-19 DIAGNOSIS — M80052D Age-related osteoporosis with current pathological fracture, left femur, subsequent encounter for fracture with routine healing: Secondary | ICD-10-CM | POA: Diagnosis not present

## 2019-09-19 DIAGNOSIS — N179 Acute kidney failure, unspecified: Secondary | ICD-10-CM | POA: Diagnosis not present

## 2019-09-19 DIAGNOSIS — I2510 Atherosclerotic heart disease of native coronary artery without angina pectoris: Secondary | ICD-10-CM | POA: Diagnosis not present

## 2019-09-19 DIAGNOSIS — Z7901 Long term (current) use of anticoagulants: Secondary | ICD-10-CM | POA: Diagnosis not present

## 2019-09-19 DIAGNOSIS — I4891 Unspecified atrial fibrillation: Secondary | ICD-10-CM | POA: Diagnosis not present

## 2019-09-19 DIAGNOSIS — L89620 Pressure ulcer of left heel, unstageable: Secondary | ICD-10-CM | POA: Diagnosis not present

## 2019-09-19 DIAGNOSIS — Z87891 Personal history of nicotine dependence: Secondary | ICD-10-CM | POA: Diagnosis not present

## 2019-09-19 DIAGNOSIS — J449 Chronic obstructive pulmonary disease, unspecified: Secondary | ICD-10-CM | POA: Diagnosis not present

## 2019-09-19 DIAGNOSIS — G8220 Paraplegia, unspecified: Secondary | ICD-10-CM | POA: Diagnosis not present

## 2019-09-21 DIAGNOSIS — E871 Hypo-osmolality and hyponatremia: Secondary | ICD-10-CM | POA: Diagnosis not present

## 2019-09-21 DIAGNOSIS — I4891 Unspecified atrial fibrillation: Secondary | ICD-10-CM | POA: Diagnosis not present

## 2019-09-21 DIAGNOSIS — M80052D Age-related osteoporosis with current pathological fracture, left femur, subsequent encounter for fracture with routine healing: Secondary | ICD-10-CM | POA: Diagnosis not present

## 2019-09-21 DIAGNOSIS — L89620 Pressure ulcer of left heel, unstageable: Secondary | ICD-10-CM | POA: Diagnosis not present

## 2019-09-21 DIAGNOSIS — Z7901 Long term (current) use of anticoagulants: Secondary | ICD-10-CM | POA: Diagnosis not present

## 2019-09-21 DIAGNOSIS — N179 Acute kidney failure, unspecified: Secondary | ICD-10-CM | POA: Diagnosis not present

## 2019-09-21 DIAGNOSIS — J449 Chronic obstructive pulmonary disease, unspecified: Secondary | ICD-10-CM | POA: Diagnosis not present

## 2019-09-21 DIAGNOSIS — Z87891 Personal history of nicotine dependence: Secondary | ICD-10-CM | POA: Diagnosis not present

## 2019-09-21 DIAGNOSIS — I2510 Atherosclerotic heart disease of native coronary artery without angina pectoris: Secondary | ICD-10-CM | POA: Diagnosis not present

## 2019-09-23 DIAGNOSIS — J449 Chronic obstructive pulmonary disease, unspecified: Secondary | ICD-10-CM | POA: Diagnosis not present

## 2019-09-23 DIAGNOSIS — E871 Hypo-osmolality and hyponatremia: Secondary | ICD-10-CM | POA: Diagnosis not present

## 2019-09-23 DIAGNOSIS — I4891 Unspecified atrial fibrillation: Secondary | ICD-10-CM | POA: Diagnosis not present

## 2019-09-23 DIAGNOSIS — Z7901 Long term (current) use of anticoagulants: Secondary | ICD-10-CM | POA: Diagnosis not present

## 2019-09-23 DIAGNOSIS — Z87891 Personal history of nicotine dependence: Secondary | ICD-10-CM | POA: Diagnosis not present

## 2019-09-23 DIAGNOSIS — M80052D Age-related osteoporosis with current pathological fracture, left femur, subsequent encounter for fracture with routine healing: Secondary | ICD-10-CM | POA: Diagnosis not present

## 2019-09-23 DIAGNOSIS — N179 Acute kidney failure, unspecified: Secondary | ICD-10-CM | POA: Diagnosis not present

## 2019-09-23 DIAGNOSIS — I2510 Atherosclerotic heart disease of native coronary artery without angina pectoris: Secondary | ICD-10-CM | POA: Diagnosis not present

## 2019-09-23 DIAGNOSIS — L89620 Pressure ulcer of left heel, unstageable: Secondary | ICD-10-CM | POA: Diagnosis not present

## 2019-09-26 DIAGNOSIS — G8220 Paraplegia, unspecified: Secondary | ICD-10-CM | POA: Diagnosis not present

## 2019-09-26 DIAGNOSIS — I10 Essential (primary) hypertension: Secondary | ICD-10-CM | POA: Diagnosis not present

## 2019-09-26 DIAGNOSIS — L89620 Pressure ulcer of left heel, unstageable: Secondary | ICD-10-CM | POA: Diagnosis not present

## 2019-09-28 DIAGNOSIS — J449 Chronic obstructive pulmonary disease, unspecified: Secondary | ICD-10-CM | POA: Diagnosis not present

## 2019-09-28 DIAGNOSIS — I2510 Atherosclerotic heart disease of native coronary artery without angina pectoris: Secondary | ICD-10-CM | POA: Diagnosis not present

## 2019-09-28 DIAGNOSIS — N179 Acute kidney failure, unspecified: Secondary | ICD-10-CM | POA: Diagnosis not present

## 2019-09-28 DIAGNOSIS — I4891 Unspecified atrial fibrillation: Secondary | ICD-10-CM | POA: Diagnosis not present

## 2019-09-28 DIAGNOSIS — E871 Hypo-osmolality and hyponatremia: Secondary | ICD-10-CM | POA: Diagnosis not present

## 2019-09-28 DIAGNOSIS — Z87891 Personal history of nicotine dependence: Secondary | ICD-10-CM | POA: Diagnosis not present

## 2019-09-28 DIAGNOSIS — M80052D Age-related osteoporosis with current pathological fracture, left femur, subsequent encounter for fracture with routine healing: Secondary | ICD-10-CM | POA: Diagnosis not present

## 2019-09-28 DIAGNOSIS — Z7901 Long term (current) use of anticoagulants: Secondary | ICD-10-CM | POA: Diagnosis not present

## 2019-09-28 DIAGNOSIS — L89620 Pressure ulcer of left heel, unstageable: Secondary | ICD-10-CM | POA: Diagnosis not present

## 2019-09-30 DIAGNOSIS — I2510 Atherosclerotic heart disease of native coronary artery without angina pectoris: Secondary | ICD-10-CM | POA: Diagnosis not present

## 2019-09-30 DIAGNOSIS — I4891 Unspecified atrial fibrillation: Secondary | ICD-10-CM | POA: Diagnosis not present

## 2019-09-30 DIAGNOSIS — M80052D Age-related osteoporosis with current pathological fracture, left femur, subsequent encounter for fracture with routine healing: Secondary | ICD-10-CM | POA: Diagnosis not present

## 2019-09-30 DIAGNOSIS — E871 Hypo-osmolality and hyponatremia: Secondary | ICD-10-CM | POA: Diagnosis not present

## 2019-09-30 DIAGNOSIS — J449 Chronic obstructive pulmonary disease, unspecified: Secondary | ICD-10-CM | POA: Diagnosis not present

## 2019-09-30 DIAGNOSIS — Z87891 Personal history of nicotine dependence: Secondary | ICD-10-CM | POA: Diagnosis not present

## 2019-09-30 DIAGNOSIS — L89620 Pressure ulcer of left heel, unstageable: Secondary | ICD-10-CM | POA: Diagnosis not present

## 2019-09-30 DIAGNOSIS — N179 Acute kidney failure, unspecified: Secondary | ICD-10-CM | POA: Diagnosis not present

## 2019-09-30 DIAGNOSIS — Z7901 Long term (current) use of anticoagulants: Secondary | ICD-10-CM | POA: Diagnosis not present

## 2019-10-03 DIAGNOSIS — J449 Chronic obstructive pulmonary disease, unspecified: Secondary | ICD-10-CM | POA: Diagnosis not present

## 2019-10-03 DIAGNOSIS — N179 Acute kidney failure, unspecified: Secondary | ICD-10-CM | POA: Diagnosis not present

## 2019-10-03 DIAGNOSIS — I2510 Atherosclerotic heart disease of native coronary artery without angina pectoris: Secondary | ICD-10-CM | POA: Diagnosis not present

## 2019-10-03 DIAGNOSIS — Z87891 Personal history of nicotine dependence: Secondary | ICD-10-CM | POA: Diagnosis not present

## 2019-10-03 DIAGNOSIS — I4891 Unspecified atrial fibrillation: Secondary | ICD-10-CM | POA: Diagnosis not present

## 2019-10-03 DIAGNOSIS — L89620 Pressure ulcer of left heel, unstageable: Secondary | ICD-10-CM | POA: Diagnosis not present

## 2019-10-03 DIAGNOSIS — E871 Hypo-osmolality and hyponatremia: Secondary | ICD-10-CM | POA: Diagnosis not present

## 2019-10-03 DIAGNOSIS — G8220 Paraplegia, unspecified: Secondary | ICD-10-CM | POA: Diagnosis not present

## 2019-10-03 DIAGNOSIS — M80052D Age-related osteoporosis with current pathological fracture, left femur, subsequent encounter for fracture with routine healing: Secondary | ICD-10-CM | POA: Diagnosis not present

## 2019-10-03 DIAGNOSIS — I10 Essential (primary) hypertension: Secondary | ICD-10-CM | POA: Diagnosis not present

## 2019-10-03 DIAGNOSIS — Z7901 Long term (current) use of anticoagulants: Secondary | ICD-10-CM | POA: Diagnosis not present

## 2019-10-05 DIAGNOSIS — I4891 Unspecified atrial fibrillation: Secondary | ICD-10-CM | POA: Diagnosis not present

## 2019-10-05 DIAGNOSIS — M80052D Age-related osteoporosis with current pathological fracture, left femur, subsequent encounter for fracture with routine healing: Secondary | ICD-10-CM | POA: Diagnosis not present

## 2019-10-05 DIAGNOSIS — E871 Hypo-osmolality and hyponatremia: Secondary | ICD-10-CM | POA: Diagnosis not present

## 2019-10-05 DIAGNOSIS — L89620 Pressure ulcer of left heel, unstageable: Secondary | ICD-10-CM | POA: Diagnosis not present

## 2019-10-05 DIAGNOSIS — I2510 Atherosclerotic heart disease of native coronary artery without angina pectoris: Secondary | ICD-10-CM | POA: Diagnosis not present

## 2019-10-05 DIAGNOSIS — N179 Acute kidney failure, unspecified: Secondary | ICD-10-CM | POA: Diagnosis not present

## 2019-10-05 DIAGNOSIS — Z87891 Personal history of nicotine dependence: Secondary | ICD-10-CM | POA: Diagnosis not present

## 2019-10-05 DIAGNOSIS — J449 Chronic obstructive pulmonary disease, unspecified: Secondary | ICD-10-CM | POA: Diagnosis not present

## 2019-10-05 DIAGNOSIS — Z7901 Long term (current) use of anticoagulants: Secondary | ICD-10-CM | POA: Diagnosis not present

## 2019-10-07 DIAGNOSIS — S72335D Nondisplaced oblique fracture of shaft of left femur, subsequent encounter for closed fracture with routine healing: Secondary | ICD-10-CM | POA: Diagnosis not present

## 2019-10-07 DIAGNOSIS — N179 Acute kidney failure, unspecified: Secondary | ICD-10-CM | POA: Diagnosis not present

## 2019-10-07 DIAGNOSIS — J449 Chronic obstructive pulmonary disease, unspecified: Secondary | ICD-10-CM | POA: Diagnosis not present

## 2019-10-07 DIAGNOSIS — Z7901 Long term (current) use of anticoagulants: Secondary | ICD-10-CM | POA: Diagnosis not present

## 2019-10-07 DIAGNOSIS — E871 Hypo-osmolality and hyponatremia: Secondary | ICD-10-CM | POA: Diagnosis not present

## 2019-10-07 DIAGNOSIS — I4891 Unspecified atrial fibrillation: Secondary | ICD-10-CM | POA: Diagnosis not present

## 2019-10-07 DIAGNOSIS — I2510 Atherosclerotic heart disease of native coronary artery without angina pectoris: Secondary | ICD-10-CM | POA: Diagnosis not present

## 2019-10-07 DIAGNOSIS — Z87891 Personal history of nicotine dependence: Secondary | ICD-10-CM | POA: Diagnosis not present

## 2019-10-07 DIAGNOSIS — L89620 Pressure ulcer of left heel, unstageable: Secondary | ICD-10-CM | POA: Diagnosis not present

## 2019-10-07 DIAGNOSIS — M80052D Age-related osteoporosis with current pathological fracture, left femur, subsequent encounter for fracture with routine healing: Secondary | ICD-10-CM | POA: Diagnosis not present

## 2019-10-08 DIAGNOSIS — L89620 Pressure ulcer of left heel, unstageable: Secondary | ICD-10-CM | POA: Diagnosis not present

## 2019-10-08 DIAGNOSIS — N179 Acute kidney failure, unspecified: Secondary | ICD-10-CM | POA: Diagnosis not present

## 2019-10-08 DIAGNOSIS — J449 Chronic obstructive pulmonary disease, unspecified: Secondary | ICD-10-CM | POA: Diagnosis not present

## 2019-10-08 DIAGNOSIS — I4891 Unspecified atrial fibrillation: Secondary | ICD-10-CM | POA: Diagnosis not present

## 2019-10-08 DIAGNOSIS — M80052D Age-related osteoporosis with current pathological fracture, left femur, subsequent encounter for fracture with routine healing: Secondary | ICD-10-CM | POA: Diagnosis not present

## 2019-10-08 DIAGNOSIS — L89310 Pressure ulcer of right buttock, unstageable: Secondary | ICD-10-CM | POA: Diagnosis not present

## 2019-10-08 DIAGNOSIS — E871 Hypo-osmolality and hyponatremia: Secondary | ICD-10-CM | POA: Diagnosis not present

## 2019-10-08 DIAGNOSIS — Z7901 Long term (current) use of anticoagulants: Secondary | ICD-10-CM | POA: Diagnosis not present

## 2019-10-08 DIAGNOSIS — I2510 Atherosclerotic heart disease of native coronary artery without angina pectoris: Secondary | ICD-10-CM | POA: Diagnosis not present

## 2019-10-08 DIAGNOSIS — Z87891 Personal history of nicotine dependence: Secondary | ICD-10-CM | POA: Diagnosis not present

## 2019-10-10 DIAGNOSIS — G8220 Paraplegia, unspecified: Secondary | ICD-10-CM | POA: Diagnosis not present

## 2019-10-10 DIAGNOSIS — I10 Essential (primary) hypertension: Secondary | ICD-10-CM | POA: Diagnosis not present

## 2019-10-10 DIAGNOSIS — L89620 Pressure ulcer of left heel, unstageable: Secondary | ICD-10-CM | POA: Diagnosis not present

## 2019-10-12 DIAGNOSIS — Z7901 Long term (current) use of anticoagulants: Secondary | ICD-10-CM | POA: Diagnosis not present

## 2019-10-12 DIAGNOSIS — M80052D Age-related osteoporosis with current pathological fracture, left femur, subsequent encounter for fracture with routine healing: Secondary | ICD-10-CM | POA: Diagnosis not present

## 2019-10-12 DIAGNOSIS — L89620 Pressure ulcer of left heel, unstageable: Secondary | ICD-10-CM | POA: Diagnosis not present

## 2019-10-12 DIAGNOSIS — N179 Acute kidney failure, unspecified: Secondary | ICD-10-CM | POA: Diagnosis not present

## 2019-10-12 DIAGNOSIS — J449 Chronic obstructive pulmonary disease, unspecified: Secondary | ICD-10-CM | POA: Diagnosis not present

## 2019-10-12 DIAGNOSIS — Z87891 Personal history of nicotine dependence: Secondary | ICD-10-CM | POA: Diagnosis not present

## 2019-10-12 DIAGNOSIS — I2510 Atherosclerotic heart disease of native coronary artery without angina pectoris: Secondary | ICD-10-CM | POA: Diagnosis not present

## 2019-10-12 DIAGNOSIS — E871 Hypo-osmolality and hyponatremia: Secondary | ICD-10-CM | POA: Diagnosis not present

## 2019-10-12 DIAGNOSIS — I4891 Unspecified atrial fibrillation: Secondary | ICD-10-CM | POA: Diagnosis not present

## 2019-10-14 DIAGNOSIS — J449 Chronic obstructive pulmonary disease, unspecified: Secondary | ICD-10-CM | POA: Diagnosis not present

## 2019-10-14 DIAGNOSIS — E871 Hypo-osmolality and hyponatremia: Secondary | ICD-10-CM | POA: Diagnosis not present

## 2019-10-14 DIAGNOSIS — I4891 Unspecified atrial fibrillation: Secondary | ICD-10-CM | POA: Diagnosis not present

## 2019-10-14 DIAGNOSIS — I2510 Atherosclerotic heart disease of native coronary artery without angina pectoris: Secondary | ICD-10-CM | POA: Diagnosis not present

## 2019-10-14 DIAGNOSIS — L89620 Pressure ulcer of left heel, unstageable: Secondary | ICD-10-CM | POA: Diagnosis not present

## 2019-10-14 DIAGNOSIS — Z87891 Personal history of nicotine dependence: Secondary | ICD-10-CM | POA: Diagnosis not present

## 2019-10-14 DIAGNOSIS — Z7901 Long term (current) use of anticoagulants: Secondary | ICD-10-CM | POA: Diagnosis not present

## 2019-10-14 DIAGNOSIS — N179 Acute kidney failure, unspecified: Secondary | ICD-10-CM | POA: Diagnosis not present

## 2019-10-14 DIAGNOSIS — M80052D Age-related osteoporosis with current pathological fracture, left femur, subsequent encounter for fracture with routine healing: Secondary | ICD-10-CM | POA: Diagnosis not present

## 2019-10-19 DIAGNOSIS — I4891 Unspecified atrial fibrillation: Secondary | ICD-10-CM | POA: Diagnosis not present

## 2019-10-19 DIAGNOSIS — N179 Acute kidney failure, unspecified: Secondary | ICD-10-CM | POA: Diagnosis not present

## 2019-10-19 DIAGNOSIS — L89620 Pressure ulcer of left heel, unstageable: Secondary | ICD-10-CM | POA: Diagnosis not present

## 2019-10-19 DIAGNOSIS — I2510 Atherosclerotic heart disease of native coronary artery without angina pectoris: Secondary | ICD-10-CM | POA: Diagnosis not present

## 2019-10-19 DIAGNOSIS — M80052D Age-related osteoporosis with current pathological fracture, left femur, subsequent encounter for fracture with routine healing: Secondary | ICD-10-CM | POA: Diagnosis not present

## 2019-10-19 DIAGNOSIS — E871 Hypo-osmolality and hyponatremia: Secondary | ICD-10-CM | POA: Diagnosis not present

## 2019-10-19 DIAGNOSIS — Z7901 Long term (current) use of anticoagulants: Secondary | ICD-10-CM | POA: Diagnosis not present

## 2019-10-19 DIAGNOSIS — J449 Chronic obstructive pulmonary disease, unspecified: Secondary | ICD-10-CM | POA: Diagnosis not present

## 2019-10-19 DIAGNOSIS — Z87891 Personal history of nicotine dependence: Secondary | ICD-10-CM | POA: Diagnosis not present

## 2019-10-21 DIAGNOSIS — E871 Hypo-osmolality and hyponatremia: Secondary | ICD-10-CM | POA: Diagnosis not present

## 2019-10-21 DIAGNOSIS — I4891 Unspecified atrial fibrillation: Secondary | ICD-10-CM | POA: Diagnosis not present

## 2019-10-21 DIAGNOSIS — Z87891 Personal history of nicotine dependence: Secondary | ICD-10-CM | POA: Diagnosis not present

## 2019-10-21 DIAGNOSIS — I2510 Atherosclerotic heart disease of native coronary artery without angina pectoris: Secondary | ICD-10-CM | POA: Diagnosis not present

## 2019-10-21 DIAGNOSIS — L89620 Pressure ulcer of left heel, unstageable: Secondary | ICD-10-CM | POA: Diagnosis not present

## 2019-10-21 DIAGNOSIS — J449 Chronic obstructive pulmonary disease, unspecified: Secondary | ICD-10-CM | POA: Diagnosis not present

## 2019-10-21 DIAGNOSIS — N179 Acute kidney failure, unspecified: Secondary | ICD-10-CM | POA: Diagnosis not present

## 2019-10-21 DIAGNOSIS — M80052D Age-related osteoporosis with current pathological fracture, left femur, subsequent encounter for fracture with routine healing: Secondary | ICD-10-CM | POA: Diagnosis not present

## 2019-10-21 DIAGNOSIS — Z7901 Long term (current) use of anticoagulants: Secondary | ICD-10-CM | POA: Diagnosis not present

## 2019-10-24 DIAGNOSIS — J449 Chronic obstructive pulmonary disease, unspecified: Secondary | ICD-10-CM | POA: Diagnosis not present

## 2019-10-24 DIAGNOSIS — M80052D Age-related osteoporosis with current pathological fracture, left femur, subsequent encounter for fracture with routine healing: Secondary | ICD-10-CM | POA: Diagnosis not present

## 2019-10-24 DIAGNOSIS — Z7901 Long term (current) use of anticoagulants: Secondary | ICD-10-CM | POA: Diagnosis not present

## 2019-10-24 DIAGNOSIS — I2510 Atherosclerotic heart disease of native coronary artery without angina pectoris: Secondary | ICD-10-CM | POA: Diagnosis not present

## 2019-10-24 DIAGNOSIS — L89620 Pressure ulcer of left heel, unstageable: Secondary | ICD-10-CM | POA: Diagnosis not present

## 2019-10-24 DIAGNOSIS — Z87891 Personal history of nicotine dependence: Secondary | ICD-10-CM | POA: Diagnosis not present

## 2019-10-24 DIAGNOSIS — N179 Acute kidney failure, unspecified: Secondary | ICD-10-CM | POA: Diagnosis not present

## 2019-10-24 DIAGNOSIS — I4891 Unspecified atrial fibrillation: Secondary | ICD-10-CM | POA: Diagnosis not present

## 2019-10-24 DIAGNOSIS — E871 Hypo-osmolality and hyponatremia: Secondary | ICD-10-CM | POA: Diagnosis not present

## 2019-10-26 DIAGNOSIS — M80052D Age-related osteoporosis with current pathological fracture, left femur, subsequent encounter for fracture with routine healing: Secondary | ICD-10-CM | POA: Diagnosis not present

## 2019-10-26 DIAGNOSIS — L89620 Pressure ulcer of left heel, unstageable: Secondary | ICD-10-CM | POA: Diagnosis not present

## 2019-10-26 DIAGNOSIS — I2510 Atherosclerotic heart disease of native coronary artery without angina pectoris: Secondary | ICD-10-CM | POA: Diagnosis not present

## 2019-10-26 DIAGNOSIS — Z7901 Long term (current) use of anticoagulants: Secondary | ICD-10-CM | POA: Diagnosis not present

## 2019-10-26 DIAGNOSIS — N179 Acute kidney failure, unspecified: Secondary | ICD-10-CM | POA: Diagnosis not present

## 2019-10-26 DIAGNOSIS — I4891 Unspecified atrial fibrillation: Secondary | ICD-10-CM | POA: Diagnosis not present

## 2019-10-26 DIAGNOSIS — E871 Hypo-osmolality and hyponatremia: Secondary | ICD-10-CM | POA: Diagnosis not present

## 2019-10-26 DIAGNOSIS — J449 Chronic obstructive pulmonary disease, unspecified: Secondary | ICD-10-CM | POA: Diagnosis not present

## 2019-10-26 DIAGNOSIS — Z87891 Personal history of nicotine dependence: Secondary | ICD-10-CM | POA: Diagnosis not present

## 2019-10-28 DIAGNOSIS — M80052D Age-related osteoporosis with current pathological fracture, left femur, subsequent encounter for fracture with routine healing: Secondary | ICD-10-CM | POA: Diagnosis not present

## 2019-10-28 DIAGNOSIS — E871 Hypo-osmolality and hyponatremia: Secondary | ICD-10-CM | POA: Diagnosis not present

## 2019-10-28 DIAGNOSIS — I4891 Unspecified atrial fibrillation: Secondary | ICD-10-CM | POA: Diagnosis not present

## 2019-10-28 DIAGNOSIS — Z7901 Long term (current) use of anticoagulants: Secondary | ICD-10-CM | POA: Diagnosis not present

## 2019-10-28 DIAGNOSIS — L89620 Pressure ulcer of left heel, unstageable: Secondary | ICD-10-CM | POA: Diagnosis not present

## 2019-10-28 DIAGNOSIS — Z87891 Personal history of nicotine dependence: Secondary | ICD-10-CM | POA: Diagnosis not present

## 2019-10-28 DIAGNOSIS — J449 Chronic obstructive pulmonary disease, unspecified: Secondary | ICD-10-CM | POA: Diagnosis not present

## 2019-10-28 DIAGNOSIS — I2510 Atherosclerotic heart disease of native coronary artery without angina pectoris: Secondary | ICD-10-CM | POA: Diagnosis not present

## 2019-10-28 DIAGNOSIS — N179 Acute kidney failure, unspecified: Secondary | ICD-10-CM | POA: Diagnosis not present

## 2019-10-31 DIAGNOSIS — Z87891 Personal history of nicotine dependence: Secondary | ICD-10-CM | POA: Diagnosis not present

## 2019-10-31 DIAGNOSIS — E871 Hypo-osmolality and hyponatremia: Secondary | ICD-10-CM | POA: Diagnosis not present

## 2019-10-31 DIAGNOSIS — Z7901 Long term (current) use of anticoagulants: Secondary | ICD-10-CM | POA: Diagnosis not present

## 2019-10-31 DIAGNOSIS — L89620 Pressure ulcer of left heel, unstageable: Secondary | ICD-10-CM | POA: Diagnosis not present

## 2019-10-31 DIAGNOSIS — I4891 Unspecified atrial fibrillation: Secondary | ICD-10-CM | POA: Diagnosis not present

## 2019-10-31 DIAGNOSIS — I2510 Atherosclerotic heart disease of native coronary artery without angina pectoris: Secondary | ICD-10-CM | POA: Diagnosis not present

## 2019-10-31 DIAGNOSIS — J449 Chronic obstructive pulmonary disease, unspecified: Secondary | ICD-10-CM | POA: Diagnosis not present

## 2019-10-31 DIAGNOSIS — N179 Acute kidney failure, unspecified: Secondary | ICD-10-CM | POA: Diagnosis not present

## 2019-10-31 DIAGNOSIS — M80052D Age-related osteoporosis with current pathological fracture, left femur, subsequent encounter for fracture with routine healing: Secondary | ICD-10-CM | POA: Diagnosis not present

## 2019-11-03 DIAGNOSIS — J449 Chronic obstructive pulmonary disease, unspecified: Secondary | ICD-10-CM | POA: Diagnosis not present

## 2019-11-03 DIAGNOSIS — E871 Hypo-osmolality and hyponatremia: Secondary | ICD-10-CM | POA: Diagnosis not present

## 2019-11-03 DIAGNOSIS — Z87891 Personal history of nicotine dependence: Secondary | ICD-10-CM | POA: Diagnosis not present

## 2019-11-03 DIAGNOSIS — L89620 Pressure ulcer of left heel, unstageable: Secondary | ICD-10-CM | POA: Diagnosis not present

## 2019-11-03 DIAGNOSIS — I4891 Unspecified atrial fibrillation: Secondary | ICD-10-CM | POA: Diagnosis not present

## 2019-11-03 DIAGNOSIS — N179 Acute kidney failure, unspecified: Secondary | ICD-10-CM | POA: Diagnosis not present

## 2019-11-03 DIAGNOSIS — M80052D Age-related osteoporosis with current pathological fracture, left femur, subsequent encounter for fracture with routine healing: Secondary | ICD-10-CM | POA: Diagnosis not present

## 2019-11-03 DIAGNOSIS — I2510 Atherosclerotic heart disease of native coronary artery without angina pectoris: Secondary | ICD-10-CM | POA: Diagnosis not present

## 2019-11-03 DIAGNOSIS — Z7901 Long term (current) use of anticoagulants: Secondary | ICD-10-CM | POA: Diagnosis not present

## 2019-11-07 DIAGNOSIS — I4891 Unspecified atrial fibrillation: Secondary | ICD-10-CM | POA: Diagnosis not present

## 2019-11-07 DIAGNOSIS — L89620 Pressure ulcer of left heel, unstageable: Secondary | ICD-10-CM | POA: Diagnosis not present

## 2019-11-07 DIAGNOSIS — G8220 Paraplegia, unspecified: Secondary | ICD-10-CM | POA: Diagnosis not present

## 2019-11-07 DIAGNOSIS — Z87891 Personal history of nicotine dependence: Secondary | ICD-10-CM | POA: Diagnosis not present

## 2019-11-07 DIAGNOSIS — Z7901 Long term (current) use of anticoagulants: Secondary | ICD-10-CM | POA: Diagnosis not present

## 2019-11-07 DIAGNOSIS — I2510 Atherosclerotic heart disease of native coronary artery without angina pectoris: Secondary | ICD-10-CM | POA: Diagnosis not present

## 2019-11-07 DIAGNOSIS — N179 Acute kidney failure, unspecified: Secondary | ICD-10-CM | POA: Diagnosis not present

## 2019-11-07 DIAGNOSIS — J449 Chronic obstructive pulmonary disease, unspecified: Secondary | ICD-10-CM | POA: Diagnosis not present

## 2019-11-07 DIAGNOSIS — M80052D Age-related osteoporosis with current pathological fracture, left femur, subsequent encounter for fracture with routine healing: Secondary | ICD-10-CM | POA: Diagnosis not present

## 2019-11-07 DIAGNOSIS — E871 Hypo-osmolality and hyponatremia: Secondary | ICD-10-CM | POA: Diagnosis not present

## 2019-11-07 DIAGNOSIS — I10 Essential (primary) hypertension: Secondary | ICD-10-CM | POA: Diagnosis not present

## 2019-11-09 DIAGNOSIS — Z87891 Personal history of nicotine dependence: Secondary | ICD-10-CM | POA: Diagnosis not present

## 2019-11-09 DIAGNOSIS — I4891 Unspecified atrial fibrillation: Secondary | ICD-10-CM | POA: Diagnosis not present

## 2019-11-09 DIAGNOSIS — J449 Chronic obstructive pulmonary disease, unspecified: Secondary | ICD-10-CM | POA: Diagnosis not present

## 2019-11-09 DIAGNOSIS — N179 Acute kidney failure, unspecified: Secondary | ICD-10-CM | POA: Diagnosis not present

## 2019-11-09 DIAGNOSIS — E871 Hypo-osmolality and hyponatremia: Secondary | ICD-10-CM | POA: Diagnosis not present

## 2019-11-09 DIAGNOSIS — I2510 Atherosclerotic heart disease of native coronary artery without angina pectoris: Secondary | ICD-10-CM | POA: Diagnosis not present

## 2019-11-09 DIAGNOSIS — Z7901 Long term (current) use of anticoagulants: Secondary | ICD-10-CM | POA: Diagnosis not present

## 2019-11-09 DIAGNOSIS — M80052D Age-related osteoporosis with current pathological fracture, left femur, subsequent encounter for fracture with routine healing: Secondary | ICD-10-CM | POA: Diagnosis not present

## 2019-11-09 DIAGNOSIS — L89620 Pressure ulcer of left heel, unstageable: Secondary | ICD-10-CM | POA: Diagnosis not present

## 2019-11-11 DIAGNOSIS — E871 Hypo-osmolality and hyponatremia: Secondary | ICD-10-CM | POA: Diagnosis not present

## 2019-11-11 DIAGNOSIS — I2510 Atherosclerotic heart disease of native coronary artery without angina pectoris: Secondary | ICD-10-CM | POA: Diagnosis not present

## 2019-11-11 DIAGNOSIS — M80052D Age-related osteoporosis with current pathological fracture, left femur, subsequent encounter for fracture with routine healing: Secondary | ICD-10-CM | POA: Diagnosis not present

## 2019-11-11 DIAGNOSIS — I4891 Unspecified atrial fibrillation: Secondary | ICD-10-CM | POA: Diagnosis not present

## 2019-11-11 DIAGNOSIS — Z87891 Personal history of nicotine dependence: Secondary | ICD-10-CM | POA: Diagnosis not present

## 2019-11-11 DIAGNOSIS — L89620 Pressure ulcer of left heel, unstageable: Secondary | ICD-10-CM | POA: Diagnosis not present

## 2019-11-11 DIAGNOSIS — N179 Acute kidney failure, unspecified: Secondary | ICD-10-CM | POA: Diagnosis not present

## 2019-11-11 DIAGNOSIS — Z7901 Long term (current) use of anticoagulants: Secondary | ICD-10-CM | POA: Diagnosis not present

## 2019-11-11 DIAGNOSIS — J449 Chronic obstructive pulmonary disease, unspecified: Secondary | ICD-10-CM | POA: Diagnosis not present

## 2019-11-14 DIAGNOSIS — G8220 Paraplegia, unspecified: Secondary | ICD-10-CM | POA: Diagnosis not present

## 2019-11-14 DIAGNOSIS — L89620 Pressure ulcer of left heel, unstageable: Secondary | ICD-10-CM | POA: Diagnosis not present

## 2019-11-14 DIAGNOSIS — I10 Essential (primary) hypertension: Secondary | ICD-10-CM | POA: Diagnosis not present

## 2019-11-16 DIAGNOSIS — E871 Hypo-osmolality and hyponatremia: Secondary | ICD-10-CM | POA: Diagnosis not present

## 2019-11-16 DIAGNOSIS — Z7901 Long term (current) use of anticoagulants: Secondary | ICD-10-CM | POA: Diagnosis not present

## 2019-11-16 DIAGNOSIS — N179 Acute kidney failure, unspecified: Secondary | ICD-10-CM | POA: Diagnosis not present

## 2019-11-16 DIAGNOSIS — Z87891 Personal history of nicotine dependence: Secondary | ICD-10-CM | POA: Diagnosis not present

## 2019-11-16 DIAGNOSIS — I2510 Atherosclerotic heart disease of native coronary artery without angina pectoris: Secondary | ICD-10-CM | POA: Diagnosis not present

## 2019-11-16 DIAGNOSIS — L89620 Pressure ulcer of left heel, unstageable: Secondary | ICD-10-CM | POA: Diagnosis not present

## 2019-11-16 DIAGNOSIS — M80052D Age-related osteoporosis with current pathological fracture, left femur, subsequent encounter for fracture with routine healing: Secondary | ICD-10-CM | POA: Diagnosis not present

## 2019-11-16 DIAGNOSIS — J449 Chronic obstructive pulmonary disease, unspecified: Secondary | ICD-10-CM | POA: Diagnosis not present

## 2019-11-16 DIAGNOSIS — I4891 Unspecified atrial fibrillation: Secondary | ICD-10-CM | POA: Diagnosis not present

## 2019-11-17 ENCOUNTER — Other Ambulatory Visit: Payer: Self-pay | Admitting: Cardiology

## 2019-11-18 DIAGNOSIS — I2510 Atherosclerotic heart disease of native coronary artery without angina pectoris: Secondary | ICD-10-CM | POA: Diagnosis not present

## 2019-11-18 DIAGNOSIS — N179 Acute kidney failure, unspecified: Secondary | ICD-10-CM | POA: Diagnosis not present

## 2019-11-18 DIAGNOSIS — I4891 Unspecified atrial fibrillation: Secondary | ICD-10-CM | POA: Diagnosis not present

## 2019-11-18 DIAGNOSIS — M80052D Age-related osteoporosis with current pathological fracture, left femur, subsequent encounter for fracture with routine healing: Secondary | ICD-10-CM | POA: Diagnosis not present

## 2019-11-18 DIAGNOSIS — J449 Chronic obstructive pulmonary disease, unspecified: Secondary | ICD-10-CM | POA: Diagnosis not present

## 2019-11-18 DIAGNOSIS — E871 Hypo-osmolality and hyponatremia: Secondary | ICD-10-CM | POA: Diagnosis not present

## 2019-11-18 DIAGNOSIS — Z87891 Personal history of nicotine dependence: Secondary | ICD-10-CM | POA: Diagnosis not present

## 2019-11-18 DIAGNOSIS — L89620 Pressure ulcer of left heel, unstageable: Secondary | ICD-10-CM | POA: Diagnosis not present

## 2019-11-18 DIAGNOSIS — Z7901 Long term (current) use of anticoagulants: Secondary | ICD-10-CM | POA: Diagnosis not present

## 2019-11-21 DIAGNOSIS — I10 Essential (primary) hypertension: Secondary | ICD-10-CM | POA: Diagnosis not present

## 2019-11-21 DIAGNOSIS — G8220 Paraplegia, unspecified: Secondary | ICD-10-CM | POA: Diagnosis not present

## 2019-11-21 DIAGNOSIS — L89620 Pressure ulcer of left heel, unstageable: Secondary | ICD-10-CM | POA: Diagnosis not present

## 2019-11-23 DIAGNOSIS — N179 Acute kidney failure, unspecified: Secondary | ICD-10-CM | POA: Diagnosis not present

## 2019-11-23 DIAGNOSIS — E871 Hypo-osmolality and hyponatremia: Secondary | ICD-10-CM | POA: Diagnosis not present

## 2019-11-23 DIAGNOSIS — Z87891 Personal history of nicotine dependence: Secondary | ICD-10-CM | POA: Diagnosis not present

## 2019-11-23 DIAGNOSIS — M80052D Age-related osteoporosis with current pathological fracture, left femur, subsequent encounter for fracture with routine healing: Secondary | ICD-10-CM | POA: Diagnosis not present

## 2019-11-23 DIAGNOSIS — I2510 Atherosclerotic heart disease of native coronary artery without angina pectoris: Secondary | ICD-10-CM | POA: Diagnosis not present

## 2019-11-23 DIAGNOSIS — L89620 Pressure ulcer of left heel, unstageable: Secondary | ICD-10-CM | POA: Diagnosis not present

## 2019-11-23 DIAGNOSIS — I4891 Unspecified atrial fibrillation: Secondary | ICD-10-CM | POA: Diagnosis not present

## 2019-11-23 DIAGNOSIS — Z7901 Long term (current) use of anticoagulants: Secondary | ICD-10-CM | POA: Diagnosis not present

## 2019-11-23 DIAGNOSIS — J449 Chronic obstructive pulmonary disease, unspecified: Secondary | ICD-10-CM | POA: Diagnosis not present

## 2019-11-25 DIAGNOSIS — Z87891 Personal history of nicotine dependence: Secondary | ICD-10-CM | POA: Diagnosis not present

## 2019-11-25 DIAGNOSIS — I4891 Unspecified atrial fibrillation: Secondary | ICD-10-CM | POA: Diagnosis not present

## 2019-11-25 DIAGNOSIS — I2510 Atherosclerotic heart disease of native coronary artery without angina pectoris: Secondary | ICD-10-CM | POA: Diagnosis not present

## 2019-11-25 DIAGNOSIS — E871 Hypo-osmolality and hyponatremia: Secondary | ICD-10-CM | POA: Diagnosis not present

## 2019-11-25 DIAGNOSIS — L89620 Pressure ulcer of left heel, unstageable: Secondary | ICD-10-CM | POA: Diagnosis not present

## 2019-11-25 DIAGNOSIS — N179 Acute kidney failure, unspecified: Secondary | ICD-10-CM | POA: Diagnosis not present

## 2019-11-25 DIAGNOSIS — M80052D Age-related osteoporosis with current pathological fracture, left femur, subsequent encounter for fracture with routine healing: Secondary | ICD-10-CM | POA: Diagnosis not present

## 2019-11-25 DIAGNOSIS — J449 Chronic obstructive pulmonary disease, unspecified: Secondary | ICD-10-CM | POA: Diagnosis not present

## 2019-11-25 DIAGNOSIS — Z7901 Long term (current) use of anticoagulants: Secondary | ICD-10-CM | POA: Diagnosis not present

## 2019-11-28 DIAGNOSIS — E871 Hypo-osmolality and hyponatremia: Secondary | ICD-10-CM | POA: Diagnosis not present

## 2019-11-28 DIAGNOSIS — N179 Acute kidney failure, unspecified: Secondary | ICD-10-CM | POA: Diagnosis not present

## 2019-11-28 DIAGNOSIS — I10 Essential (primary) hypertension: Secondary | ICD-10-CM | POA: Diagnosis not present

## 2019-11-28 DIAGNOSIS — G8220 Paraplegia, unspecified: Secondary | ICD-10-CM | POA: Diagnosis not present

## 2019-11-28 DIAGNOSIS — Z87891 Personal history of nicotine dependence: Secondary | ICD-10-CM | POA: Diagnosis not present

## 2019-11-28 DIAGNOSIS — I2510 Atherosclerotic heart disease of native coronary artery without angina pectoris: Secondary | ICD-10-CM | POA: Diagnosis not present

## 2019-11-28 DIAGNOSIS — Z7901 Long term (current) use of anticoagulants: Secondary | ICD-10-CM | POA: Diagnosis not present

## 2019-11-28 DIAGNOSIS — M80052D Age-related osteoporosis with current pathological fracture, left femur, subsequent encounter for fracture with routine healing: Secondary | ICD-10-CM | POA: Diagnosis not present

## 2019-11-28 DIAGNOSIS — I4891 Unspecified atrial fibrillation: Secondary | ICD-10-CM | POA: Diagnosis not present

## 2019-11-28 DIAGNOSIS — J449 Chronic obstructive pulmonary disease, unspecified: Secondary | ICD-10-CM | POA: Diagnosis not present

## 2019-11-28 DIAGNOSIS — L89620 Pressure ulcer of left heel, unstageable: Secondary | ICD-10-CM | POA: Diagnosis not present

## 2019-11-30 DIAGNOSIS — L89620 Pressure ulcer of left heel, unstageable: Secondary | ICD-10-CM | POA: Diagnosis not present

## 2019-11-30 DIAGNOSIS — E871 Hypo-osmolality and hyponatremia: Secondary | ICD-10-CM | POA: Diagnosis not present

## 2019-11-30 DIAGNOSIS — Z7901 Long term (current) use of anticoagulants: Secondary | ICD-10-CM | POA: Diagnosis not present

## 2019-11-30 DIAGNOSIS — M80052D Age-related osteoporosis with current pathological fracture, left femur, subsequent encounter for fracture with routine healing: Secondary | ICD-10-CM | POA: Diagnosis not present

## 2019-11-30 DIAGNOSIS — Z87891 Personal history of nicotine dependence: Secondary | ICD-10-CM | POA: Diagnosis not present

## 2019-11-30 DIAGNOSIS — J449 Chronic obstructive pulmonary disease, unspecified: Secondary | ICD-10-CM | POA: Diagnosis not present

## 2019-11-30 DIAGNOSIS — I4891 Unspecified atrial fibrillation: Secondary | ICD-10-CM | POA: Diagnosis not present

## 2019-11-30 DIAGNOSIS — N179 Acute kidney failure, unspecified: Secondary | ICD-10-CM | POA: Diagnosis not present

## 2019-11-30 DIAGNOSIS — I2510 Atherosclerotic heart disease of native coronary artery without angina pectoris: Secondary | ICD-10-CM | POA: Diagnosis not present

## 2019-12-02 DIAGNOSIS — Z7901 Long term (current) use of anticoagulants: Secondary | ICD-10-CM | POA: Diagnosis not present

## 2019-12-02 DIAGNOSIS — J449 Chronic obstructive pulmonary disease, unspecified: Secondary | ICD-10-CM | POA: Diagnosis not present

## 2019-12-02 DIAGNOSIS — L89620 Pressure ulcer of left heel, unstageable: Secondary | ICD-10-CM | POA: Diagnosis not present

## 2019-12-02 DIAGNOSIS — I4891 Unspecified atrial fibrillation: Secondary | ICD-10-CM | POA: Diagnosis not present

## 2019-12-02 DIAGNOSIS — Z87891 Personal history of nicotine dependence: Secondary | ICD-10-CM | POA: Diagnosis not present

## 2019-12-02 DIAGNOSIS — I2510 Atherosclerotic heart disease of native coronary artery without angina pectoris: Secondary | ICD-10-CM | POA: Diagnosis not present

## 2019-12-02 DIAGNOSIS — N179 Acute kidney failure, unspecified: Secondary | ICD-10-CM | POA: Diagnosis not present

## 2019-12-02 DIAGNOSIS — E871 Hypo-osmolality and hyponatremia: Secondary | ICD-10-CM | POA: Diagnosis not present

## 2019-12-02 DIAGNOSIS — M80052D Age-related osteoporosis with current pathological fracture, left femur, subsequent encounter for fracture with routine healing: Secondary | ICD-10-CM | POA: Diagnosis not present

## 2019-12-07 DIAGNOSIS — N179 Acute kidney failure, unspecified: Secondary | ICD-10-CM | POA: Diagnosis not present

## 2019-12-07 DIAGNOSIS — J449 Chronic obstructive pulmonary disease, unspecified: Secondary | ICD-10-CM | POA: Diagnosis not present

## 2019-12-07 DIAGNOSIS — Z87891 Personal history of nicotine dependence: Secondary | ICD-10-CM | POA: Diagnosis not present

## 2019-12-07 DIAGNOSIS — M80052D Age-related osteoporosis with current pathological fracture, left femur, subsequent encounter for fracture with routine healing: Secondary | ICD-10-CM | POA: Diagnosis not present

## 2019-12-07 DIAGNOSIS — I2510 Atherosclerotic heart disease of native coronary artery without angina pectoris: Secondary | ICD-10-CM | POA: Diagnosis not present

## 2019-12-07 DIAGNOSIS — I4891 Unspecified atrial fibrillation: Secondary | ICD-10-CM | POA: Diagnosis not present

## 2019-12-07 DIAGNOSIS — L89620 Pressure ulcer of left heel, unstageable: Secondary | ICD-10-CM | POA: Diagnosis not present

## 2019-12-07 DIAGNOSIS — E871 Hypo-osmolality and hyponatremia: Secondary | ICD-10-CM | POA: Diagnosis not present

## 2019-12-07 DIAGNOSIS — Z7901 Long term (current) use of anticoagulants: Secondary | ICD-10-CM | POA: Diagnosis not present

## 2019-12-09 DIAGNOSIS — E871 Hypo-osmolality and hyponatremia: Secondary | ICD-10-CM | POA: Diagnosis not present

## 2019-12-09 DIAGNOSIS — J449 Chronic obstructive pulmonary disease, unspecified: Secondary | ICD-10-CM | POA: Diagnosis not present

## 2019-12-09 DIAGNOSIS — I4891 Unspecified atrial fibrillation: Secondary | ICD-10-CM | POA: Diagnosis not present

## 2019-12-09 DIAGNOSIS — Z7901 Long term (current) use of anticoagulants: Secondary | ICD-10-CM | POA: Diagnosis not present

## 2019-12-09 DIAGNOSIS — I2510 Atherosclerotic heart disease of native coronary artery without angina pectoris: Secondary | ICD-10-CM | POA: Diagnosis not present

## 2019-12-09 DIAGNOSIS — M8949 Other hypertrophic osteoarthropathy, multiple sites: Secondary | ICD-10-CM | POA: Diagnosis not present

## 2019-12-09 DIAGNOSIS — I10 Essential (primary) hypertension: Secondary | ICD-10-CM | POA: Diagnosis not present

## 2019-12-09 DIAGNOSIS — Z87891 Personal history of nicotine dependence: Secondary | ICD-10-CM | POA: Diagnosis not present

## 2019-12-09 DIAGNOSIS — M80052D Age-related osteoporosis with current pathological fracture, left femur, subsequent encounter for fracture with routine healing: Secondary | ICD-10-CM | POA: Diagnosis not present

## 2019-12-09 DIAGNOSIS — N179 Acute kidney failure, unspecified: Secondary | ICD-10-CM | POA: Diagnosis not present

## 2019-12-09 DIAGNOSIS — S72335S Nondisplaced oblique fracture of shaft of left femur, sequela: Secondary | ICD-10-CM | POA: Diagnosis not present

## 2019-12-09 DIAGNOSIS — L89620 Pressure ulcer of left heel, unstageable: Secondary | ICD-10-CM | POA: Diagnosis not present

## 2019-12-12 DIAGNOSIS — L89620 Pressure ulcer of left heel, unstageable: Secondary | ICD-10-CM | POA: Diagnosis not present

## 2019-12-12 DIAGNOSIS — G8220 Paraplegia, unspecified: Secondary | ICD-10-CM | POA: Diagnosis not present

## 2019-12-12 DIAGNOSIS — I10 Essential (primary) hypertension: Secondary | ICD-10-CM | POA: Diagnosis not present

## 2019-12-12 DIAGNOSIS — L89629 Pressure ulcer of left heel, unspecified stage: Secondary | ICD-10-CM | POA: Diagnosis not present

## 2019-12-14 DIAGNOSIS — M80052D Age-related osteoporosis with current pathological fracture, left femur, subsequent encounter for fracture with routine healing: Secondary | ICD-10-CM | POA: Diagnosis not present

## 2019-12-14 DIAGNOSIS — J449 Chronic obstructive pulmonary disease, unspecified: Secondary | ICD-10-CM | POA: Diagnosis not present

## 2019-12-14 DIAGNOSIS — Z7901 Long term (current) use of anticoagulants: Secondary | ICD-10-CM | POA: Diagnosis not present

## 2019-12-14 DIAGNOSIS — E871 Hypo-osmolality and hyponatremia: Secondary | ICD-10-CM | POA: Diagnosis not present

## 2019-12-14 DIAGNOSIS — I4891 Unspecified atrial fibrillation: Secondary | ICD-10-CM | POA: Diagnosis not present

## 2019-12-14 DIAGNOSIS — N179 Acute kidney failure, unspecified: Secondary | ICD-10-CM | POA: Diagnosis not present

## 2019-12-14 DIAGNOSIS — L89620 Pressure ulcer of left heel, unstageable: Secondary | ICD-10-CM | POA: Diagnosis not present

## 2019-12-14 DIAGNOSIS — Z87891 Personal history of nicotine dependence: Secondary | ICD-10-CM | POA: Diagnosis not present

## 2019-12-14 DIAGNOSIS — I2510 Atherosclerotic heart disease of native coronary artery without angina pectoris: Secondary | ICD-10-CM | POA: Diagnosis not present

## 2019-12-16 DIAGNOSIS — N179 Acute kidney failure, unspecified: Secondary | ICD-10-CM | POA: Diagnosis not present

## 2019-12-16 DIAGNOSIS — I2510 Atherosclerotic heart disease of native coronary artery without angina pectoris: Secondary | ICD-10-CM | POA: Diagnosis not present

## 2019-12-16 DIAGNOSIS — L89620 Pressure ulcer of left heel, unstageable: Secondary | ICD-10-CM | POA: Diagnosis not present

## 2019-12-16 DIAGNOSIS — Z7901 Long term (current) use of anticoagulants: Secondary | ICD-10-CM | POA: Diagnosis not present

## 2019-12-16 DIAGNOSIS — M80052D Age-related osteoporosis with current pathological fracture, left femur, subsequent encounter for fracture with routine healing: Secondary | ICD-10-CM | POA: Diagnosis not present

## 2019-12-16 DIAGNOSIS — E871 Hypo-osmolality and hyponatremia: Secondary | ICD-10-CM | POA: Diagnosis not present

## 2019-12-16 DIAGNOSIS — Z87891 Personal history of nicotine dependence: Secondary | ICD-10-CM | POA: Diagnosis not present

## 2019-12-16 DIAGNOSIS — J449 Chronic obstructive pulmonary disease, unspecified: Secondary | ICD-10-CM | POA: Diagnosis not present

## 2019-12-16 DIAGNOSIS — I4891 Unspecified atrial fibrillation: Secondary | ICD-10-CM | POA: Diagnosis not present

## 2019-12-17 ENCOUNTER — Other Ambulatory Visit: Payer: Self-pay | Admitting: Cardiology

## 2019-12-19 DIAGNOSIS — L89620 Pressure ulcer of left heel, unstageable: Secondary | ICD-10-CM | POA: Diagnosis not present

## 2019-12-19 DIAGNOSIS — I10 Essential (primary) hypertension: Secondary | ICD-10-CM | POA: Diagnosis not present

## 2019-12-19 DIAGNOSIS — G8220 Paraplegia, unspecified: Secondary | ICD-10-CM | POA: Diagnosis not present

## 2019-12-21 DIAGNOSIS — M80052D Age-related osteoporosis with current pathological fracture, left femur, subsequent encounter for fracture with routine healing: Secondary | ICD-10-CM | POA: Diagnosis not present

## 2019-12-21 DIAGNOSIS — I4891 Unspecified atrial fibrillation: Secondary | ICD-10-CM | POA: Diagnosis not present

## 2019-12-21 DIAGNOSIS — E871 Hypo-osmolality and hyponatremia: Secondary | ICD-10-CM | POA: Diagnosis not present

## 2019-12-21 DIAGNOSIS — J449 Chronic obstructive pulmonary disease, unspecified: Secondary | ICD-10-CM | POA: Diagnosis not present

## 2019-12-21 DIAGNOSIS — Z87891 Personal history of nicotine dependence: Secondary | ICD-10-CM | POA: Diagnosis not present

## 2019-12-21 DIAGNOSIS — L89620 Pressure ulcer of left heel, unstageable: Secondary | ICD-10-CM | POA: Diagnosis not present

## 2019-12-21 DIAGNOSIS — Z7901 Long term (current) use of anticoagulants: Secondary | ICD-10-CM | POA: Diagnosis not present

## 2019-12-21 DIAGNOSIS — I2510 Atherosclerotic heart disease of native coronary artery without angina pectoris: Secondary | ICD-10-CM | POA: Diagnosis not present

## 2019-12-21 DIAGNOSIS — N179 Acute kidney failure, unspecified: Secondary | ICD-10-CM | POA: Diagnosis not present

## 2019-12-23 DIAGNOSIS — I4891 Unspecified atrial fibrillation: Secondary | ICD-10-CM | POA: Diagnosis not present

## 2019-12-23 DIAGNOSIS — E871 Hypo-osmolality and hyponatremia: Secondary | ICD-10-CM | POA: Diagnosis not present

## 2019-12-23 DIAGNOSIS — Z87891 Personal history of nicotine dependence: Secondary | ICD-10-CM | POA: Diagnosis not present

## 2019-12-23 DIAGNOSIS — N179 Acute kidney failure, unspecified: Secondary | ICD-10-CM | POA: Diagnosis not present

## 2019-12-23 DIAGNOSIS — Z7901 Long term (current) use of anticoagulants: Secondary | ICD-10-CM | POA: Diagnosis not present

## 2019-12-23 DIAGNOSIS — I2510 Atherosclerotic heart disease of native coronary artery without angina pectoris: Secondary | ICD-10-CM | POA: Diagnosis not present

## 2019-12-23 DIAGNOSIS — M80052D Age-related osteoporosis with current pathological fracture, left femur, subsequent encounter for fracture with routine healing: Secondary | ICD-10-CM | POA: Diagnosis not present

## 2019-12-23 DIAGNOSIS — J449 Chronic obstructive pulmonary disease, unspecified: Secondary | ICD-10-CM | POA: Diagnosis not present

## 2019-12-23 DIAGNOSIS — L89620 Pressure ulcer of left heel, unstageable: Secondary | ICD-10-CM | POA: Diagnosis not present

## 2019-12-28 DIAGNOSIS — Z7901 Long term (current) use of anticoagulants: Secondary | ICD-10-CM | POA: Diagnosis not present

## 2019-12-28 DIAGNOSIS — Z87891 Personal history of nicotine dependence: Secondary | ICD-10-CM | POA: Diagnosis not present

## 2019-12-28 DIAGNOSIS — N179 Acute kidney failure, unspecified: Secondary | ICD-10-CM | POA: Diagnosis not present

## 2019-12-28 DIAGNOSIS — L89620 Pressure ulcer of left heel, unstageable: Secondary | ICD-10-CM | POA: Diagnosis not present

## 2019-12-28 DIAGNOSIS — E871 Hypo-osmolality and hyponatremia: Secondary | ICD-10-CM | POA: Diagnosis not present

## 2019-12-28 DIAGNOSIS — I2510 Atherosclerotic heart disease of native coronary artery without angina pectoris: Secondary | ICD-10-CM | POA: Diagnosis not present

## 2019-12-28 DIAGNOSIS — I4891 Unspecified atrial fibrillation: Secondary | ICD-10-CM | POA: Diagnosis not present

## 2019-12-28 DIAGNOSIS — J449 Chronic obstructive pulmonary disease, unspecified: Secondary | ICD-10-CM | POA: Diagnosis not present

## 2019-12-28 DIAGNOSIS — M80052D Age-related osteoporosis with current pathological fracture, left femur, subsequent encounter for fracture with routine healing: Secondary | ICD-10-CM | POA: Diagnosis not present

## 2019-12-30 DIAGNOSIS — I4891 Unspecified atrial fibrillation: Secondary | ICD-10-CM | POA: Diagnosis not present

## 2019-12-30 DIAGNOSIS — Z87891 Personal history of nicotine dependence: Secondary | ICD-10-CM | POA: Diagnosis not present

## 2019-12-30 DIAGNOSIS — L89620 Pressure ulcer of left heel, unstageable: Secondary | ICD-10-CM | POA: Diagnosis not present

## 2019-12-30 DIAGNOSIS — M80052D Age-related osteoporosis with current pathological fracture, left femur, subsequent encounter for fracture with routine healing: Secondary | ICD-10-CM | POA: Diagnosis not present

## 2019-12-30 DIAGNOSIS — Z7901 Long term (current) use of anticoagulants: Secondary | ICD-10-CM | POA: Diagnosis not present

## 2019-12-30 DIAGNOSIS — N179 Acute kidney failure, unspecified: Secondary | ICD-10-CM | POA: Diagnosis not present

## 2019-12-30 DIAGNOSIS — J449 Chronic obstructive pulmonary disease, unspecified: Secondary | ICD-10-CM | POA: Diagnosis not present

## 2019-12-30 DIAGNOSIS — E871 Hypo-osmolality and hyponatremia: Secondary | ICD-10-CM | POA: Diagnosis not present

## 2019-12-30 DIAGNOSIS — I2510 Atherosclerotic heart disease of native coronary artery without angina pectoris: Secondary | ICD-10-CM | POA: Diagnosis not present

## 2020-01-04 DIAGNOSIS — Z87891 Personal history of nicotine dependence: Secondary | ICD-10-CM | POA: Diagnosis not present

## 2020-01-04 DIAGNOSIS — M80052D Age-related osteoporosis with current pathological fracture, left femur, subsequent encounter for fracture with routine healing: Secondary | ICD-10-CM | POA: Diagnosis not present

## 2020-01-04 DIAGNOSIS — Z7901 Long term (current) use of anticoagulants: Secondary | ICD-10-CM | POA: Diagnosis not present

## 2020-01-04 DIAGNOSIS — L89620 Pressure ulcer of left heel, unstageable: Secondary | ICD-10-CM | POA: Diagnosis not present

## 2020-01-04 DIAGNOSIS — J449 Chronic obstructive pulmonary disease, unspecified: Secondary | ICD-10-CM | POA: Diagnosis not present

## 2020-01-04 DIAGNOSIS — N179 Acute kidney failure, unspecified: Secondary | ICD-10-CM | POA: Diagnosis not present

## 2020-01-04 DIAGNOSIS — I4891 Unspecified atrial fibrillation: Secondary | ICD-10-CM | POA: Diagnosis not present

## 2020-01-04 DIAGNOSIS — E871 Hypo-osmolality and hyponatremia: Secondary | ICD-10-CM | POA: Diagnosis not present

## 2020-01-04 DIAGNOSIS — I2510 Atherosclerotic heart disease of native coronary artery without angina pectoris: Secondary | ICD-10-CM | POA: Diagnosis not present

## 2020-01-05 ENCOUNTER — Other Ambulatory Visit: Payer: Self-pay

## 2020-01-06 DIAGNOSIS — I4891 Unspecified atrial fibrillation: Secondary | ICD-10-CM | POA: Diagnosis not present

## 2020-01-06 DIAGNOSIS — Z7901 Long term (current) use of anticoagulants: Secondary | ICD-10-CM | POA: Diagnosis not present

## 2020-01-06 DIAGNOSIS — Z87891 Personal history of nicotine dependence: Secondary | ICD-10-CM | POA: Diagnosis not present

## 2020-01-06 DIAGNOSIS — J449 Chronic obstructive pulmonary disease, unspecified: Secondary | ICD-10-CM | POA: Diagnosis not present

## 2020-01-06 DIAGNOSIS — N179 Acute kidney failure, unspecified: Secondary | ICD-10-CM | POA: Diagnosis not present

## 2020-01-06 DIAGNOSIS — L89620 Pressure ulcer of left heel, unstageable: Secondary | ICD-10-CM | POA: Diagnosis not present

## 2020-01-06 DIAGNOSIS — M80052D Age-related osteoporosis with current pathological fracture, left femur, subsequent encounter for fracture with routine healing: Secondary | ICD-10-CM | POA: Diagnosis not present

## 2020-01-06 DIAGNOSIS — E871 Hypo-osmolality and hyponatremia: Secondary | ICD-10-CM | POA: Diagnosis not present

## 2020-01-06 DIAGNOSIS — I2510 Atherosclerotic heart disease of native coronary artery without angina pectoris: Secondary | ICD-10-CM | POA: Diagnosis not present

## 2020-01-11 DIAGNOSIS — I2510 Atherosclerotic heart disease of native coronary artery without angina pectoris: Secondary | ICD-10-CM | POA: Diagnosis not present

## 2020-01-11 DIAGNOSIS — M80052D Age-related osteoporosis with current pathological fracture, left femur, subsequent encounter for fracture with routine healing: Secondary | ICD-10-CM | POA: Diagnosis not present

## 2020-01-11 DIAGNOSIS — N179 Acute kidney failure, unspecified: Secondary | ICD-10-CM | POA: Diagnosis not present

## 2020-01-11 DIAGNOSIS — I4891 Unspecified atrial fibrillation: Secondary | ICD-10-CM | POA: Diagnosis not present

## 2020-01-11 DIAGNOSIS — Z87891 Personal history of nicotine dependence: Secondary | ICD-10-CM | POA: Diagnosis not present

## 2020-01-11 DIAGNOSIS — L89620 Pressure ulcer of left heel, unstageable: Secondary | ICD-10-CM | POA: Diagnosis not present

## 2020-01-11 DIAGNOSIS — J449 Chronic obstructive pulmonary disease, unspecified: Secondary | ICD-10-CM | POA: Diagnosis not present

## 2020-01-11 DIAGNOSIS — Z7901 Long term (current) use of anticoagulants: Secondary | ICD-10-CM | POA: Diagnosis not present

## 2020-01-11 DIAGNOSIS — E871 Hypo-osmolality and hyponatremia: Secondary | ICD-10-CM | POA: Diagnosis not present

## 2020-01-13 DIAGNOSIS — M80052D Age-related osteoporosis with current pathological fracture, left femur, subsequent encounter for fracture with routine healing: Secondary | ICD-10-CM | POA: Diagnosis not present

## 2020-01-13 DIAGNOSIS — J449 Chronic obstructive pulmonary disease, unspecified: Secondary | ICD-10-CM | POA: Diagnosis not present

## 2020-01-13 DIAGNOSIS — I2510 Atherosclerotic heart disease of native coronary artery without angina pectoris: Secondary | ICD-10-CM | POA: Diagnosis not present

## 2020-01-13 DIAGNOSIS — Z87891 Personal history of nicotine dependence: Secondary | ICD-10-CM | POA: Diagnosis not present

## 2020-01-13 DIAGNOSIS — E871 Hypo-osmolality and hyponatremia: Secondary | ICD-10-CM | POA: Diagnosis not present

## 2020-01-13 DIAGNOSIS — I4891 Unspecified atrial fibrillation: Secondary | ICD-10-CM | POA: Diagnosis not present

## 2020-01-13 DIAGNOSIS — L89620 Pressure ulcer of left heel, unstageable: Secondary | ICD-10-CM | POA: Diagnosis not present

## 2020-01-13 DIAGNOSIS — N179 Acute kidney failure, unspecified: Secondary | ICD-10-CM | POA: Diagnosis not present

## 2020-01-13 DIAGNOSIS — Z7901 Long term (current) use of anticoagulants: Secondary | ICD-10-CM | POA: Diagnosis not present

## 2020-01-18 DIAGNOSIS — L89620 Pressure ulcer of left heel, unstageable: Secondary | ICD-10-CM | POA: Diagnosis not present

## 2020-01-18 DIAGNOSIS — I2510 Atherosclerotic heart disease of native coronary artery without angina pectoris: Secondary | ICD-10-CM | POA: Diagnosis not present

## 2020-01-18 DIAGNOSIS — J449 Chronic obstructive pulmonary disease, unspecified: Secondary | ICD-10-CM | POA: Diagnosis not present

## 2020-01-18 DIAGNOSIS — E871 Hypo-osmolality and hyponatremia: Secondary | ICD-10-CM | POA: Diagnosis not present

## 2020-01-18 DIAGNOSIS — N179 Acute kidney failure, unspecified: Secondary | ICD-10-CM | POA: Diagnosis not present

## 2020-01-18 DIAGNOSIS — M80052D Age-related osteoporosis with current pathological fracture, left femur, subsequent encounter for fracture with routine healing: Secondary | ICD-10-CM | POA: Diagnosis not present

## 2020-01-18 DIAGNOSIS — I4891 Unspecified atrial fibrillation: Secondary | ICD-10-CM | POA: Diagnosis not present

## 2020-01-18 DIAGNOSIS — Z87891 Personal history of nicotine dependence: Secondary | ICD-10-CM | POA: Diagnosis not present

## 2020-01-18 DIAGNOSIS — Z7901 Long term (current) use of anticoagulants: Secondary | ICD-10-CM | POA: Diagnosis not present

## 2020-01-20 DIAGNOSIS — Z87891 Personal history of nicotine dependence: Secondary | ICD-10-CM | POA: Diagnosis not present

## 2020-01-20 DIAGNOSIS — I2510 Atherosclerotic heart disease of native coronary artery without angina pectoris: Secondary | ICD-10-CM | POA: Diagnosis not present

## 2020-01-20 DIAGNOSIS — J449 Chronic obstructive pulmonary disease, unspecified: Secondary | ICD-10-CM | POA: Diagnosis not present

## 2020-01-20 DIAGNOSIS — Z7901 Long term (current) use of anticoagulants: Secondary | ICD-10-CM | POA: Diagnosis not present

## 2020-01-20 DIAGNOSIS — I4891 Unspecified atrial fibrillation: Secondary | ICD-10-CM | POA: Diagnosis not present

## 2020-01-20 DIAGNOSIS — N179 Acute kidney failure, unspecified: Secondary | ICD-10-CM | POA: Diagnosis not present

## 2020-01-20 DIAGNOSIS — E871 Hypo-osmolality and hyponatremia: Secondary | ICD-10-CM | POA: Diagnosis not present

## 2020-01-20 DIAGNOSIS — L89620 Pressure ulcer of left heel, unstageable: Secondary | ICD-10-CM | POA: Diagnosis not present

## 2020-01-20 DIAGNOSIS — M80052D Age-related osteoporosis with current pathological fracture, left femur, subsequent encounter for fracture with routine healing: Secondary | ICD-10-CM | POA: Diagnosis not present

## 2020-01-25 DIAGNOSIS — E871 Hypo-osmolality and hyponatremia: Secondary | ICD-10-CM | POA: Diagnosis not present

## 2020-01-25 DIAGNOSIS — M80052D Age-related osteoporosis with current pathological fracture, left femur, subsequent encounter for fracture with routine healing: Secondary | ICD-10-CM | POA: Diagnosis not present

## 2020-01-25 DIAGNOSIS — N179 Acute kidney failure, unspecified: Secondary | ICD-10-CM | POA: Diagnosis not present

## 2020-01-25 DIAGNOSIS — I2510 Atherosclerotic heart disease of native coronary artery without angina pectoris: Secondary | ICD-10-CM | POA: Diagnosis not present

## 2020-01-25 DIAGNOSIS — I4891 Unspecified atrial fibrillation: Secondary | ICD-10-CM | POA: Diagnosis not present

## 2020-01-25 DIAGNOSIS — J449 Chronic obstructive pulmonary disease, unspecified: Secondary | ICD-10-CM | POA: Diagnosis not present

## 2020-01-25 DIAGNOSIS — Z7901 Long term (current) use of anticoagulants: Secondary | ICD-10-CM | POA: Diagnosis not present

## 2020-01-25 DIAGNOSIS — L89620 Pressure ulcer of left heel, unstageable: Secondary | ICD-10-CM | POA: Diagnosis not present

## 2020-01-25 DIAGNOSIS — Z87891 Personal history of nicotine dependence: Secondary | ICD-10-CM | POA: Diagnosis not present

## 2020-01-27 DIAGNOSIS — Z7901 Long term (current) use of anticoagulants: Secondary | ICD-10-CM | POA: Diagnosis not present

## 2020-01-27 DIAGNOSIS — N179 Acute kidney failure, unspecified: Secondary | ICD-10-CM | POA: Diagnosis not present

## 2020-01-27 DIAGNOSIS — I2510 Atherosclerotic heart disease of native coronary artery without angina pectoris: Secondary | ICD-10-CM | POA: Diagnosis not present

## 2020-01-27 DIAGNOSIS — I4891 Unspecified atrial fibrillation: Secondary | ICD-10-CM | POA: Diagnosis not present

## 2020-01-27 DIAGNOSIS — M80052D Age-related osteoporosis with current pathological fracture, left femur, subsequent encounter for fracture with routine healing: Secondary | ICD-10-CM | POA: Diagnosis not present

## 2020-01-27 DIAGNOSIS — E871 Hypo-osmolality and hyponatremia: Secondary | ICD-10-CM | POA: Diagnosis not present

## 2020-01-27 DIAGNOSIS — L89620 Pressure ulcer of left heel, unstageable: Secondary | ICD-10-CM | POA: Diagnosis not present

## 2020-01-27 DIAGNOSIS — J449 Chronic obstructive pulmonary disease, unspecified: Secondary | ICD-10-CM | POA: Diagnosis not present

## 2020-01-27 DIAGNOSIS — Z87891 Personal history of nicotine dependence: Secondary | ICD-10-CM | POA: Diagnosis not present

## 2020-02-13 DIAGNOSIS — R0600 Dyspnea, unspecified: Secondary | ICD-10-CM | POA: Diagnosis not present

## 2020-02-13 DIAGNOSIS — G894 Chronic pain syndrome: Secondary | ICD-10-CM | POA: Diagnosis not present

## 2020-02-13 DIAGNOSIS — I2510 Atherosclerotic heart disease of native coronary artery without angina pectoris: Secondary | ICD-10-CM | POA: Diagnosis not present

## 2020-02-17 ENCOUNTER — Encounter: Payer: Self-pay | Admitting: *Deleted

## 2020-02-17 ENCOUNTER — Encounter: Payer: Self-pay | Admitting: Cardiology

## 2020-02-23 ENCOUNTER — Ambulatory Visit: Payer: Medicare HMO | Admitting: Cardiology

## 2020-02-23 ENCOUNTER — Other Ambulatory Visit: Payer: Self-pay

## 2020-02-23 ENCOUNTER — Encounter: Payer: Self-pay | Admitting: Cardiology

## 2020-02-23 ENCOUNTER — Ambulatory Visit (INDEPENDENT_AMBULATORY_CARE_PROVIDER_SITE_OTHER): Payer: Medicare HMO

## 2020-02-23 VITALS — BP 138/74 | HR 46 | Ht 71.5 in

## 2020-02-23 DIAGNOSIS — I2510 Atherosclerotic heart disease of native coronary artery without angina pectoris: Secondary | ICD-10-CM

## 2020-02-23 DIAGNOSIS — R0600 Dyspnea, unspecified: Secondary | ICD-10-CM

## 2020-02-23 DIAGNOSIS — R0609 Other forms of dyspnea: Secondary | ICD-10-CM

## 2020-02-23 DIAGNOSIS — E782 Mixed hyperlipidemia: Secondary | ICD-10-CM

## 2020-02-23 DIAGNOSIS — I10 Essential (primary) hypertension: Secondary | ICD-10-CM

## 2020-02-23 DIAGNOSIS — R079 Chest pain, unspecified: Secondary | ICD-10-CM | POA: Diagnosis not present

## 2020-02-23 DIAGNOSIS — I4821 Permanent atrial fibrillation: Secondary | ICD-10-CM

## 2020-02-23 MED ORDER — NITROGLYCERIN 0.4 MG SL SUBL: 0.4000 mg | SUBLINGUAL_TABLET | SUBLINGUAL | 6 refills | Status: DC | PRN

## 2020-02-23 NOTE — Progress Notes (Signed)
Cardiology Office Note:    Date:  02/23/2020   ID:  Cameron Willis, DOB 1934/05/26, MRN [id number]  PCP:  Townsend Roger, MD  Cardiologist:  Berniece Salines, DO  Electrophysiologist:  None   Referring MD: Nona Dell, Corene Cornea, MD   " I am have had some chest pain across my chest with shortness of breath"  History of Present Illness:    Cameron Willis is a 86 y.o. male with a hx of coronary artery disease status post CABG in 2013, hypertension, hyperlipidemia, permanent atrial fibrillation on Eliquis and COPD presents today for reestablishment of cardiac care.  The patient tells me that he has been experiencing some of the chest pain.  He described this as a intermittent diffuse chest tightness on exertion.  He notes that this has been going on for couple weeks and he is concerned.  In addition he tells me that there is some shortness of breath which is quite bothersome.  He did see his PCP who wanted him to see cardiology as he had not been seen in a long time.  Past Medical History:  Diagnosis Date  . Anxiety and depression   . Cervical disc disorder at C4-C5 level with myelopathy 12/05/2016  . Contracture of right knee 12/05/2016  . COPD (chronic obstructive pulmonary disease) ([place]) 03/22/2012  . Coronary artery disease involving native coronary artery of native heart without angina pectoris 03/22/2012   Overview:  Coronary artery bypass graft August 2013 LIMA to LAD and SVG to RCA and SVG to obtuse marginal  . Dyslipidemia 03/26/2015  . Esophageal stenosis   . Gait disturbance 10/14/2017  . GERD (gastroesophageal reflux disease) 03/22/2012  . Hemorrhoids   . History of degenerative disc disease   . HTN (hypertension) 03/22/2012  . Hypertrophy of prostate   . MI, old 03/22/2012  . Osteoarthrosis   . Paroxysmal atrial fibrillation ([place]) 03/26/2015   Overview:  Chads score 2, was anticoagulated but developed tarry stool optic admission was withdrawn for now scheduled to see gastroenterologist  .  Pre-op evaluation 03/25/2016  . S/P CABG x 3 03/25/2012  . Tobacco abuse 03/22/2012   Overview:  Quit 2 years ago    Past Surgical History:  Procedure Laterality Date  . CARDIAC CATHETERIZATION    . COLONOSCOPY  05/07/2015   Colonic polyps status post polypectomy. Internal hemorrhoids ( likely etiology of rectal bleeding)   . CORONARY ARTERY BYPASS GRAFT    . ESOPHAGOGASTRODUODENOSCOPY  11/06/2005   Smal hiatal hernia. Moderate gastritis  . HEMORRHOID SURGERY  1996    Current Medications: Current Meds  Medication Sig  . ELIQUIS 5 MG TABS tablet Take 1 tablet (5 mg total) by mouth 2 (two) times daily.  . meclizine (ANTIVERT) 12.5 MG tablet Take 1 tablet by mouth as needed for dizziness.  . nitroGLYCERIN (NITROSTAT) 0.4 MG SL tablet Place 1 tablet under the tongue as needed for chest pain.  . pravastatin (PRAVACHOL) 80 MG tablet Take 80 mg by mouth at bedtime.  . traMADol (ULTRAM) 50 MG tablet Take 50 mg by mouth at bedtime as needed.     Allergies:   Aciphex [rabeprazole sodium], Lipitor [atorvastatin], Meclizine, Omeprazole, and Pravachol [pravastatin]   Social History   Socioeconomic History  . Marital status: Married    Spouse name: Not on file  . Number of children: Not on file  . Years of education: Not on file  . Highest education level: Not on file  Occupational History  . Not  on file  Tobacco Use  . Smoking status: Former Research scientist (life sciences)  . Smokeless tobacco: Never Used  Vaping Use  . Vaping Use: Never used  Substance and Sexual Activity  . Alcohol use: No  . Drug use: No  . Sexual activity: Not on file  Other Topics Concern  . Not on file  Social History Narrative  . Not on file   Social Determinants of Health   Financial Resource Strain:   . Difficulty of Paying Living Expenses:   Food Insecurity:   . Worried About Charity fundraiser in the Last Year:   . Arboriculturist in the Last Year:   Transportation Needs:   . Film/video editor (Medical):   Marland Kitchen  Lack of Transportation (Non-Medical):   Physical Activity:   . Days of Exercise per Week:   . Minutes of Exercise per Session:   Stress:   . Feeling of Stress :   Social Connections:   . Frequency of Communication with Friends and Family:   . Frequency of Social Gatherings with Friends and Family:   . Attends Religious Services:   . Active Member of Clubs or Organizations:   . Attends Archivist Meetings:   Marland Kitchen Marital Status:      Family History: The patient's family history includes Cancer in his father; Esophageal cancer in his father. There is no history of Colon cancer.  ROS:   Review of Systems  Constitution: Negative for decreased appetite, fever and weight gain.  HENT: Negative for congestion, ear discharge, hoarse voice and sore throat.   Eyes: Negative for discharge, redness, vision loss in right eye and visual halos.  Cardiovascular: Negative for chest pain, dyspnea on exertion, leg swelling, orthopnea and palpitations.  Respiratory: Negative for cough, hemoptysis, shortness of breath and snoring.   Endocrine: Negative for heat intolerance and polyphagia.  Hematologic/Lymphatic: Negative for bleeding problem. Does not bruise/bleed easily.  Skin: Negative for flushing, nail changes, rash and suspicious lesions.  Musculoskeletal: Negative for arthritis, joint pain, muscle cramps, myalgias, neck pain and stiffness.  Gastrointestinal: Negative for abdominal pain, bowel incontinence, diarrhea and excessive appetite.  Genitourinary: Negative for decreased libido, genital sores and incomplete emptying.  Neurological: Negative for brief paralysis, focal weakness, headaches and loss of balance.  Psychiatric/Behavioral: Negative for altered mental status, depression and suicidal ideas.  Allergic/Immunologic: Negative for HIV exposure and persistent infections.    EKGs/Labs/Other Studies Reviewed:    The following studies were reviewed today:   EKG:  The ekg ordered  today demonstrates atrial fibrillation with slow ventricular rate, heart rate 46 bpm.  He had a Holter monitor 24-hour Holter in July 2019 which showed continued atrial flutter with 2.7-second pauses.  Recent Labs: No results found for requested labs within last 8760 hours.  Recent Lipid Panel No results found for: CHOL, TRIG, HDL, CHOLHDL, VLDL, LDLCALC, LDLDIRECT  Physical Exam:    VS:  BP (!) 138/74   Pulse 46   Ht 5' 11.5" (1.816 m)   BMI 23.24 kg/m     Wt Readings from Last 3 Encounters:  03/01/18 169 lb (76.7 kg)  01/20/18 169 lb (76.7 kg)     GEN: Well nourished, well developed in no acute distress HEENT: Normal NECK: No JVD; No carotid bruits LYMPHATICS: No lymphadenopathy CARDIAC: S1S2 noted,RRR, no murmurs, rubs, gallops RESPIRATORY:  Clear to auscultation without rales, wheezing or rhonchi  ABDOMEN: Soft, non-tender, non-distended, +bowel sounds, no guarding. EXTREMITIES: No edema, No cyanosis, no clubbing  MUSCULOSKELETAL:  No deformity  SKIN: Warm and dry NEUROLOGIC:  Alert and oriented x 3, non-focal PSYCHIATRIC:  Normal affect, good insight  ASSESSMENT:    1. Coronary artery disease involving native heart, angina presence unspecified, unspecified vessel or lesion type   2. Dyspnea on exertion   3. Permanent atrial fibrillation ([place])   4. Essential hypertension   5. Mixed hyperlipidemia    PLAN:     1.  Coronary artery disease-he is experiencing chest pain which is concerning a like to investigate his progression of coronary artery disease by getting him a nuclear stress test.  Sublingual nitroglycerin prescription was sent, its protocol and 911 protocol explained and the patient vocalized understanding questions were answered to the patient's satisfaction.  2.  For shortness of breath for completeness we will get an echocardiogram to reassess his LV function and any other structural abnormalities.  3.  He is with atrial fibrillation and slow ventricular  rate in the office today.  His previous monitors showed 2-second pauses.  I would like to investigate to make sure that he is not experiencing worsening pauses with his afib which could lead to potential intervention.  4.  His blood pressure susceptible in the office no changes will be made to his current regimen.  5.  Upper lipidemia continue patient on his Pravachol.  The patient is in agreement with the above plan. The patient left the office in stable condition.  The patient will follow up in   Medication Adjustments/Labs and Tests Ordered: Current medicines are reviewed at length with the patient today.  Concerns regarding medicines are outlined above.  No orders of the defined types were placed in this encounter.  No orders of the defined types were placed in this encounter.   Patient Instructions  Medication Instructions:  Your physician has recommended you make the following change in your medication:   Take nitroglycerin as needed for chest pain.  *If you need a refill on your cardiac medications before your next appointment, please call your pharmacy*   Lab Work: None ordered If you have labs (blood work) drawn today and your tests are completely normal, you will receive your results only by: Marland Kitchen MyChart Message (if you have MyChart) OR . A paper copy in the mail If you have any lab test that is abnormal or we need to change your treatment, we will call you to review the results.   Testing/Procedures: Your physician has requested that you have an echocardiogram. Echocardiography is a painless test that uses sound waves to create images of your heart. It provides your doctor with information about the size and shape of your heart and how well your heart's chambers and valves are working. This procedure takes approximately one hour. There are no restrictions for this procedure.   WHY IS MY DOCTOR PRESCRIBING ZIO? The Zio system is proven and trusted by physicians to detect  and diagnose irregular heart rhythms -- and has been prescribed to hundreds of thousands of patients.  The FDA has cleared the Zio system to monitor for many different kinds of irregular heart rhythms. In a study, physicians were able to reach a diagnosis 90% of the time with the Zio system1.  You can wear the Zio monitor -- a small, discreet, comfortable patch -- during your normal day-to-day activity, including while you sleep, shower, and exercise, while it records every single heartbeat for analysis.  1Barrett, P., et al. Comparison of 24 Hour Holter Monitoring Versus 14 Day Novel  Adhesive Patch Electrocardiographic Monitoring. [place], 2014.  ZIO VS. HOLTER MONITORING The Zio monitor can be comfortably worn for up to 14 days. Holter monitors can be worn for 24 to 48 hours, limiting the time to record any irregular heart rhythms you may have. Zio is able to capture data for the 51% of patients who have their first symptom-triggered arrhythmia after 48 hours.1  LIVE WITHOUT RESTRICTIONS The Zio ambulatory cardiac monitor is a small, unobtrusive, and water-resistant patch--you might even forget you're wearing it. The Zio monitor records and stores every beat of your heart, whether you're sleeping, working out, or showering.  Wear the monitor for 7 days, remove 03/01/20.  Your physician has requested that you have a lexiscan myoview. For further information please visit [email or web address]. Please follow instruction sheet, as given.  The test will take approximately 3 to 4 hours to complete; you may bring reading material.  If someone comes with you to your appointment, they will need to remain in the main lobby due to limited space in the testing area. **If you are pregnant or breastfeeding, please notify the nuclear lab prior to your appointment**  How to prepare for your Myocardial Perfusion Test: . Do not eat or drink 3 hours prior to your test, except you may have  water. . Do not consume products containing caffeine (regular or decaffeinated) 12 hours prior to your test. (ex: coffee, chocolate, sodas, tea). . Do bring a list of your current medications with you.  If not listed below, you may take your medications as normal. . Do wear comfortable clothes (no dresses or overalls) and walking shoes, tennis shoes preferred (No heels or open toe shoes are allowed). . Do NOT wear cologne, perfume, aftershave, or lotions (deodorant is allowed). . If these instructions are not followed, your test will have to be rescheduled.  Follow-Up: At [facility], you and your health needs are our priority.  As part of our continuing mission to provide you with exceptional heart care, we have created designated Provider Care Teams.  These Care Teams include your primary Cardiologist (physician) and Advanced Practice Providers (APPs -  Physician Assistants and Nurse Practitioners) who all work together to provide you with the care you need, when you need it.  We recommend signing up for the patient portal called "MyChart".  Sign up information is provided on this After Visit Summary.  MyChart is used to connect with patients for Virtual Visits (Telemedicine).  Patients are able to view lab/test results, encounter notes, upcoming appointments, etc.  Non-urgent messages can be sent to your provider as well.   To learn more about what you can do with MyChart, go to [email or web address].    Your next appointment:   8 week(s)  The format for your next appointment:   In Person  Provider:   Berniece Salines, DO   Other Instructions  Nuclear Medicine Exam A nuclear medicine exam is a safe and painless imaging test. It helps your health care provider detect and diagnose diseases. It also provides information about the ways your organs work and how they are structured. For a nuclear medicine exam, you will be given a radioactive tracer. This substance is absorbed by your body's  organs. A large scanning machine detects the tracer and creates pictures of the areas that your health care provider wants to know more about. There are several kinds of nuclear medicine exams. They include the following:  CT scan.  MRI scan.  PET scan.  SPECT scan. Tell your health care provider about:  Any allergies you have.  All medicines you are taking, including vitamins, herbs, eye drops, creams, and over-the-counter medicines.  Any problems you or family members have had with anesthetic medicines.  Any blood disorders you have.  Any surgeries you have had.  Any medical conditions you have.  Whether you are pregnant or may be pregnant.  Whether you are nursing. What are the risks? Generally, this is a safe procedure. However, problems may occur, such as an allergic reaction to the tracer, but this is rare. What happens before the procedure? Medicines Ask your health care provider about:  Changing or stopping your regular medicines. This is especially important if you are taking diabetes medicines or blood thinners.  Taking medicines such as aspirin and ibuprofen. These medicines can thin your blood. Do not take these medicines unless your health care provider tells you to take them.  Taking over-the-counter medicines, vitamins, herbs, and supplements. General instructions  Follow instructions from your health care provider about eating or drinking restrictions.  Do not wear jewelry.  Wear loose, comfortable clothing. You may be asked to wear a hospital gown for the procedure.  Bring previous imaging studies, such as X-rays, with you to the exam if they are available. What happens during the procedure?   An IV may be inserted into one of your veins.  You will be asked to lie on a table or sit in a chair.  You will be given the radioactive tracer. You may get: ? A pill or liquid to swallow. ? An injection. ? Medicine through your IV. ? A gas to  inhale.  A large scanning machine will be used to create images of your body. After the pictures are taken, you may have to wait so your health care provider can make sure that enough images were taken. The procedure may vary among health care providers and hospitals. What happens after the procedure?  You may go home after the procedure and return to your usual activities, unless your health care provider tells you otherwise.  Drink enough water to keep your urine pale yellow. This helps to remove the radioactive tracer from your body.  It is up to you to get the results of your procedure. Ask your health care provider, or the department that is doing the procedure, when your results will be ready.  Get help right away if you have problems breathing. Summary  A nuclear medicine exam is a safe and painless imaging test that provides information about how your organs are working. It is also used to detect and diagnose diseases of various body organs.  Follow your health care provider's instructions about eating and drinking restrictions. Ask whether you should change or stop any medicines.  During the procedure, you will be given a radioactive tracer. A large scanning machine will create images of your body.  You may go home after the procedure and return to your regular activities. Follow your health care provider's instructions.  Get help right away if you have problems breathing. This information is not intended to replace advice given to you by your health care provider. Make sure you discuss any questions you have with your health care provider. Document Revised: 06/09/2018 Document Reviewed: 06/09/2018 Elsevier Patient Education  [place].  Echocardiogram An echocardiogram is a procedure that uses painless sound waves (ultrasound) to produce an image of the heart. Images from an echocardiogram can provide important information about:  Signs of coronary artery disease  (CAD).  Aneurysm detection. An aneurysm is a weak or damaged part of an artery wall that bulges out from the normal force of blood pumping through the body.  Heart size and shape. Changes in the size or shape of the heart can be associated with certain conditions, including heart failure, aneurysm, and CAD.  Heart muscle function.  Heart valve function.  Signs of a past heart attack.  Fluid buildup around the heart.  Thickening of the heart muscle.  A tumor or infectious growth around the heart valves. Tell a health care provider about:  Any allergies you have.  All medicines you are taking, including vitamins, herbs, eye drops, creams, and over-the-counter medicines.  Any blood disorders you have.  Any surgeries you have had.  Any medical conditions you have.  Whether you are pregnant or may be pregnant. What are the risks? Generally, this is a safe procedure. However, problems may occur, including:  Allergic reaction to dye (contrast) that may be used during the procedure. What happens before the procedure? No specific preparation is needed. You may eat and drink normally. What happens during the procedure?   An IV tube may be inserted into one of your veins.  You may receive contrast through this tube. A contrast is an injection that improves the quality of the pictures from your heart.  A gel will be applied to your chest.  A wand-like tool (transducer) will be moved over your chest. The gel will help to transmit the sound waves from the transducer.  The sound waves will harmlessly bounce off of your heart to allow the heart images to be captured in real-time motion. The images will be recorded on a computer. The procedure may vary among health care providers and hospitals. What happens after the procedure?  You may return to your normal, everyday life, including diet, activities, and medicines, unless your health care provider tells you not to do  that. Summary  An echocardiogram is a procedure that uses painless sound waves (ultrasound) to produce an image of the heart.  Images from an echocardiogram can provide important information about the size and shape of your heart, heart muscle function, heart valve function, and fluid buildup around your heart.  You do not need to do anything to prepare before this procedure. You may eat and drink normally.  After the echocardiogram is completed, you may return to your normal, everyday life, unless your health care provider tells you not to do that. This information is not intended to replace advice given to you by your health care provider. Make sure you discuss any questions you have with your health care provider. Document Revised: 11/11/2018 Document Reviewed: 08/23/2016 Elsevier Patient Education  [place]. Nitroglycerin sublingual tablets What is this medicine? NITROGLYCERIN (nye troe GLI ser in) is a type of vasodilator. It relaxes blood vessels, increasing the blood and oxygen supply to your heart. This medicine is used to relieve chest pain caused by angina. It is also used to prevent chest pain before activities like climbing stairs, going outdoors in cold weather, or sexual activity. This medicine may be used for other purposes; ask your health care provider or pharmacist if you have questions. COMMON BRAND NAME(S): Nitroquick, Nitrostat, Nitrotab What should I tell my health care provider before I take this medicine? They need to know if you have any of these conditions:  anemia  head injury, recent stroke, or bleeding in the brain  liver  disease  previous heart attack  an unusual or allergic reaction to nitroglycerin, other medicines, foods, dyes, or preservatives  pregnant or trying to get pregnant  breast-feeding How should I use this medicine? Take this medicine by mouth as needed. At the first sign of an angina attack (chest pain or tightness) place one  tablet under your tongue. You can also take this medicine 5 to 10 minutes before an event likely to produce chest pain. Follow the directions on the prescription label. Let the tablet dissolve under the tongue. Do not swallow whole. Replace the dose if you accidentally swallow it. It will help if your mouth is not dry. Saliva around the tablet will help it to dissolve more quickly. Do not eat or drink, smoke or chew tobacco while a tablet is dissolving. If you are not better within 5 minutes after taking ONE dose of nitroglycerin, call 9-1-1 immediately to seek emergency medical care. Do not take more than 3 nitroglycerin tablets over 15 minutes. If you take this medicine often to relieve symptoms of angina, your doctor or health care professional may provide you with different instructions to manage your symptoms. If symptoms do not go away after following these instructions, it is important to call 9-1-1 immediately. Do not take more than 3 nitroglycerin tablets over 15 minutes. Talk to your pediatrician regarding the use of this medicine in children. Special care may be needed. Overdosage: If you think you have taken too much of this medicine contact a poison control center or emergency room at once. NOTE: This medicine is only for you. Do not share this medicine with others. What if I miss a dose? This does not apply. This medicine is only used as needed. What may interact with this medicine? Do not take this medicine with any of the following medications:  certain migraine medicines like ergotamine and dihydroergotamine (DHE)  medicines used to treat erectile dysfunction like sildenafil, tadalafil, and vardenafil  riociguat This medicine may also interact with the following medications:  alteplase  aspirin  heparin  medicines for high blood pressure  medicines for mental depression  other medicines used to treat angina  phenothiazines like chlorpromazine, mesoridazine,  prochlorperazine, thioridazine This list may not describe all possible interactions. Give your health care provider a list of all the medicines, herbs, non-prescription drugs, or dietary supplements you use. Also tell them if you smoke, drink alcohol, or use illegal drugs. Some items may interact with your medicine. What should I watch for while using this medicine? Tell your doctor or health care professional if you feel your medicine is no longer working. Keep this medicine with you at all times. Sit or lie down when you take your medicine to prevent falling if you feel dizzy or faint after using it. Try to remain calm. This will help you to feel better faster. If you feel dizzy, take several deep breaths and lie down with your feet propped up, or bend forward with your head resting between your knees. You may get drowsy or dizzy. Do not drive, use machinery, or do anything that needs mental alertness until you know how this drug affects you. Do not stand or sit up quickly, especially if you are an older patient. This reduces the risk of dizzy or fainting spells. Alcohol can make you more drowsy and dizzy. Avoid alcoholic drinks. Do not treat yourself for coughs, colds, or pain while you are taking this medicine without asking your doctor or health care professional for advice.  Some ingredients may increase your blood pressure. What side effects may I notice from receiving this medicine? Side effects that you should report to your doctor or health care professional as soon as possible:  blurred vision  dry mouth  skin rash  sweating  the feeling of extreme pressure in the head  unusually weak or tired Side effects that usually do not require medical attention (report to your doctor or health care professional if they continue or are bothersome):  flushing of the face or neck  headache  irregular heartbeat, palpitations  nausea, vomiting This list may not describe all possible side  effects. Call your doctor for medical advice about side effects. You may report side effects to FDA at 1-800-FDA-1088. Where should I keep my medicine? Keep out of the reach of children. Store at room temperature between 20 and 25 degrees C (68 and 77 degrees F). Store in [facility] of [employer]. Protect from light and moisture. Keep tightly closed. Throw away any unused medicine after the expiration date. NOTE: This sheet is a summary. It may not cover all possible information. If you have questions about this medicine, talk to your doctor, pharmacist, or health care provider.  2020 Elsevier/Gold Standard (2013-05-19 17:57:36)      Adopting a Healthy Lifestyle.  Know what a healthy weight is for you (roughly BMI <25) and aim to maintain this   Aim for 7+ servings of fruits and vegetables daily   65-80+ fluid ounces of water or unsweet tea for healthy kidneys   Limit to max 1 drink of alcohol per day; avoid smoking/tobacco   Limit animal fats in diet for cholesterol and heart health - choose grass fed whenever available   Avoid highly processed foods, and foods high in saturated/trans fats   Aim for low stress - take time to unwind and care for your mental health   Aim for 150 min of moderate intensity exercise weekly for heart health, and weights twice weekly for bone health   Aim for 7-9 hours of sleep daily   When it comes to diets, agreement about the perfect plan isnt easy to find, even among the experts. Experts at the [employer] developed an idea known as the Healthy Eating Plate. Just imagine a plate divided into logical, healthy portions.   The emphasis is on diet quality:   Load up on vegetables and fruits - one-half of your plate: Aim for color and variety, and remember that potatoes dont count.   Go for whole grains - one-quarter of your plate: Whole wheat, barley, wheat berries, quinoa, oats, brown rice, and foods made with them. If you want pasta,  go with whole wheat pasta.   Protein power - one-quarter of your plate: Fish, chicken, beans, and nuts are all healthy, versatile protein sources. Limit red meat.   The diet, however, does go beyond the plate, offering a few other suggestions.   Use healthy plant oils, such as olive, canola, soy, corn, sunflower and peanut. Check the labels, and avoid partially hydrogenated oil, which have unhealthy trans fats.   If youre thirsty, drink water. Coffee and tea are good in moderation, but skip sugary drinks and limit milk and dairy products to one or two daily servings.   The type of carbohydrate in the diet is more important than the amount. Some sources of carbohydrates, such as vegetables, fruits, whole grains, and beans-are healthier than others.   Finally, stay active  Signed, Adaja Wander, DO  02/23/2020 10:54 AM    [facility]

## 2020-02-23 NOTE — Patient Instructions (Signed)
Medication Instructions:  Your physician has recommended you make the following change in your medication:   Take nitroglycerin as needed for chest pain.  *If you need a refill on your cardiac medications before your next appointment, please call your pharmacy*   Lab Work: None ordered If you have labs (blood work) drawn today and your tests are completely normal, you will receive your results only by:  [place] (if you have MyChart) OR  A paper copy in the mail If you have any lab test that is abnormal or we need to change your treatment, we will call you to review the results.   Testing/Procedures: Your physician has requested that you have an echocardiogram. Echocardiography is a painless test that uses sound waves to create images of your heart. It provides your doctor with information about the size and shape of your heart and how well your hearts chambers and valves are working. This procedure takes approximately one hour. There are no restrictions for this procedure.   WHY IS MY DOCTOR PRESCRIBING ZIO? The Zio system is proven and trusted by physicians to detect and diagnose irregular heart rhythms -- and has been prescribed to hundreds of thousands of patients.  The FDA has cleared the Zio system to monitor for many different kinds of irregular heart rhythms. In a study, physicians were able to reach a diagnosis 90% of the time with the Zio system1.  You can wear the Zio monitor -- a small, discreet, comfortable patch -- during your normal day-to-day activity, including while you sleep, shower, and exercise, while it records every single heartbeat for analysis.  1Barrett, P., et al. Comparison of 24 Hour Holter Monitoring Versus 14 Day Novel Adhesive Patch Electrocardiographic Monitoring. Gamaliel, 2014.  ZIO VS. HOLTER MONITORING The Zio monitor can be comfortably worn for up to 14 days. Holter monitors can be worn for 24 to 48 hours, limiting the  time to record any irregular heart rhythms you may have. Zio is able to capture data for the 51% of patients who have their first symptom-triggered arrhythmia after 48 hours.1  LIVE WITHOUT RESTRICTIONS The Zio ambulatory cardiac monitor is a small, unobtrusive, and water-resistant patch--you might even forget youre wearing it. The Zio monitor records and stores every beat of your heart, whether you're sleeping, working out, or showering.  Wear the monitor for 7 days, remove 03/01/20.  Your physician has requested that you have a lexiscan myoview. For further information please visit [email or web address]. Please follow instruction sheet, as given.  The test will take approximately 3 to 4 hours to complete; you may bring reading material.  If someone comes with you to your appointment, they will need to remain in the main lobby due to limited space in the testing area. **If you are pregnant or breastfeeding, please notify the nuclear lab prior to your appointment**  How to prepare for your Myocardial Perfusion Test:  Do not eat or drink 3 hours prior to your test, except you may have water.  Do not consume products containing caffeine (regular or decaffeinated) 12 hours prior to your test. (ex: coffee, chocolate, sodas, tea).  Do bring a list of your current medications with you.  If not listed below, you may take your medications as normal.  Do wear comfortable clothes (no dresses or overalls) and walking shoes, tennis shoes preferred (No heels or open toe shoes are allowed).  Do NOT wear cologne, perfume, aftershave, or lotions (deodorant is allowed).  If these instructions  are not followed, your test will have to be rescheduled.  Follow-Up: At [facility], you and your health needs are our priority.  As part of our continuing mission to provide you with exceptional heart care, we have created designated Provider Care Teams.  These Care Teams include your primary Cardiologist (physician)  and Advanced Practice Providers (APPs -  Physician Assistants and Nurse Practitioners) who all work together to provide you with the care you need, when you need it.  We recommend signing up for the patient portal called "MyChart".  Sign up information is provided on this After Visit Summary.  MyChart is used to connect with patients for Virtual Visits (Telemedicine).  Patients are able to view lab/test results, encounter notes, upcoming appointments, etc.  Non-urgent messages can be sent to your provider as well.   To learn more about what you can do with MyChart, go to [email or web address].    Your next appointment:   8 week(s)  The format for your next appointment:   In Person  Provider:   Berniece Salines, DO   Other Instructions  Nuclear Medicine Exam A nuclear medicine exam is a safe and painless imaging test. It helps your health care provider detect and diagnose diseases. It also provides information about the ways your organs work and how they are structured. For a nuclear medicine exam, you will be given a radioactive tracer. This substance is absorbed by your body's organs. A large scanning machine detects the tracer and creates pictures of the areas that your health care provider wants to know more about. There are several kinds of nuclear medicine exams. They include the following:  CT scan.  MRI scan.  PET scan.  SPECT scan. Tell your health care provider about:  Any allergies you have.  All medicines you are taking, including vitamins, herbs, eye drops, creams, and over-the-counter medicines.  Any problems you or family members have had with anesthetic medicines.  Any blood disorders you have.  Any surgeries you have had.  Any medical conditions you have.  Whether you are pregnant or may be pregnant.  Whether you are nursing. What are the risks? Generally, this is a safe procedure. However, problems may occur, such as an allergic reaction to the tracer, but  this is rare. What happens before the procedure? Medicines Ask your health care provider about:  Changing or stopping your regular medicines. This is especially important if you are taking diabetes medicines or blood thinners.  Taking medicines such as aspirin and ibuprofen. These medicines can thin your blood. Do not take these medicines unless your health care provider tells you to take them.  Taking over-the-counter medicines, vitamins, herbs, and supplements. General instructions  Follow instructions from your health care provider about eating or drinking restrictions.  Do not wear jewelry.  Wear loose, comfortable clothing. You may be asked to wear a hospital gown for the procedure.  Bring previous imaging studies, such as X-rays, with you to the exam if they are available. What happens during the procedure?   An IV may be inserted into one of your veins.  You will be asked to lie on a table or sit in a chair.  You will be given the radioactive tracer. You may get: ? A pill or liquid to swallow. ? An injection. ? Medicine through your IV. ? A gas to inhale.  A large scanning machine will be used to create images of your body. After the pictures are taken, you may have to  wait so your health care provider can make sure that enough images were taken. The procedure may vary among health care providers and hospitals. What happens after the procedure?  You may go home after the procedure and return to your usual activities, unless your health care provider tells you otherwise.  Drink enough water to keep your urine pale yellow. This helps to remove the radioactive tracer from your body.  It is up to you to get the results of your procedure. Ask your health care provider, or the department that is doing the procedure, when your results will be ready.  Get help right away if you have problems breathing. Summary  A nuclear medicine exam is a safe and painless imaging test  that provides information about how your organs are working. It is also used to detect and diagnose diseases of various body organs.  Follow your health care provider's instructions about eating and drinking restrictions. Ask whether you should change or stop any medicines.  During the procedure, you will be given a radioactive tracer. A large scanning machine will create images of your body.  You may go home after the procedure and return to your regular activities. Follow your health care provider's instructions.  Get help right away if you have problems breathing. This information is not intended to replace advice given to you by your health care provider. Make sure you discuss any questions you have with your health care provider. Document Revised: 06/09/2018 Document Reviewed: 06/09/2018 Elsevier Patient Education  [place].  Echocardiogram An echocardiogram is a procedure that uses painless sound waves (ultrasound) to produce an image of the heart. Images from an echocardiogram can provide important information about:  Signs of coronary artery disease (CAD).  Aneurysm detection. An aneurysm is a weak or damaged part of an artery wall that bulges out from the normal force of blood pumping through the body.  Heart size and shape. Changes in the size or shape of the heart can be associated with certain conditions, including heart failure, aneurysm, and CAD.  Heart muscle function.  Heart valve function.  Signs of a past heart attack.  Fluid buildup around the heart.  Thickening of the heart muscle.  A tumor or infectious growth around the heart valves. Tell a health care provider about:  Any allergies you have.  All medicines you are taking, including vitamins, herbs, eye drops, creams, and over-the-counter medicines.  Any blood disorders you have.  Any surgeries you have had.  Any medical conditions you have.  Whether you are pregnant or may be  pregnant. What are the risks? Generally, this is a safe procedure. However, problems may occur, including:  Allergic reaction to dye (contrast) that may be used during the procedure. What happens before the procedure? No specific preparation is needed. You may eat and drink normally. What happens during the procedure?   An IV tube may be inserted into one of your veins.  You may receive contrast through this tube. A contrast is an injection that improves the quality of the pictures from your heart.  A gel will be applied to your chest.  A wand-like tool (transducer) will be moved over your chest. The gel will help to transmit the sound waves from the transducer.  The sound waves will harmlessly bounce off of your heart to allow the heart images to be captured in real-time motion. The images will be recorded on a computer. The procedure may vary among health care providers and  hospitals. What happens after the procedure?  You may return to your normal, everyday life, including diet, activities, and medicines, unless your health care provider tells you not to do that. Summary  An echocardiogram is a procedure that uses painless sound waves (ultrasound) to produce an image of the heart.  Images from an echocardiogram can provide important information about the size and shape of your heart, heart muscle function, heart valve function, and fluid buildup around your heart.  You do not need to do anything to prepare before this procedure. You may eat and drink normally.  After the echocardiogram is completed, you may return to your normal, everyday life, unless your health care provider tells you not to do that. This information is not intended to replace advice given to you by your health care provider. Make sure you discuss any questions you have with your health care provider. Document Revised: 11/11/2018 Document Reviewed: 08/23/2016 Elsevier Patient Education  [employer]. Nitroglycerin sublingual tablets What is this medicine? NITROGLYCERIN (nye troe GLI ser in) is a type of vasodilator. It relaxes blood vessels, increasing the blood and oxygen supply to your heart. This medicine is used to relieve chest pain caused by angina. It is also used to prevent chest pain before activities like climbing stairs, going outdoors in cold weather, or sexual activity. This medicine may be used for other purposes; ask your health care provider or pharmacist if you have questions. COMMON BRAND NAME(S): Nitroquick, Nitrostat, Nitrotab What should I tell my health care provider before I take this medicine? They need to know if you have any of these conditions:  anemia  head injury, recent stroke, or bleeding in the brain  liver disease  previous heart attack  an unusual or allergic reaction to nitroglycerin, other medicines, foods, dyes, or preservatives  pregnant or trying to get pregnant  breast-feeding How should I use this medicine? Take this medicine by mouth as needed. At the first sign of an angina attack (chest pain or tightness) place one tablet under your tongue. You can also take this medicine 5 to 10 minutes before an event likely to produce chest pain. Follow the directions on the prescription label. Let the tablet dissolve under the tongue. Do not swallow whole. Replace the dose if you accidentally swallow it. It will help if your mouth is not dry. Saliva around the tablet will help it to dissolve more quickly. Do not eat or drink, smoke or chew tobacco while a tablet is dissolving. If you are not better within 5 minutes after taking ONE dose of nitroglycerin, call 9-1-1 immediately to seek emergency medical care. Do not take more than 3 nitroglycerin tablets over 15 minutes. If you take this medicine often to relieve symptoms of angina, your doctor or health care professional may provide you with different instructions to manage your symptoms. If symptoms do  not go away after following these instructions, it is important to call 9-1-1 immediately. Do not take more than 3 nitroglycerin tablets over 15 minutes. Talk to your pediatrician regarding the use of this medicine in children. Special care may be needed. Overdosage: If you think you have taken too much of this medicine contact a poison control center or emergency room at once. NOTE: This medicine is only for you. Do not share this medicine with others. What if I miss a dose? This does not apply. This medicine is only used as needed. What may interact with this medicine? Do not take this medicine  with any of the following medications:  certain migraine medicines like ergotamine and dihydroergotamine (DHE)  medicines used to treat erectile dysfunction like sildenafil, tadalafil, and vardenafil  riociguat This medicine may also interact with the following medications:  alteplase  aspirin  heparin  medicines for high blood pressure  medicines for mental depression  other medicines used to treat angina  phenothiazines like chlorpromazine, mesoridazine, prochlorperazine, thioridazine This list may not describe all possible interactions. Give your health care provider a list of all the medicines, herbs, non-prescription drugs, or dietary supplements you use. Also tell them if you smoke, drink alcohol, or use illegal drugs. Some items may interact with your medicine. What should I watch for while using this medicine? Tell your doctor or health care professional if you feel your medicine is no longer working. Keep this medicine with you at all times. Sit or lie down when you take your medicine to prevent falling if you feel dizzy or faint after using it. Try to remain calm. This will help you to feel better faster. If you feel dizzy, take several deep breaths and lie down with your feet propped up, or bend forward with your head resting between your knees. You may get drowsy or dizzy. Do not  drive, use machinery, or do anything that needs mental alertness until you know how this drug affects you. Do not stand or sit up quickly, especially if you are an older patient. This reduces the risk of dizzy or fainting spells. Alcohol can make you more drowsy and dizzy. Avoid alcoholic drinks. Do not treat yourself for coughs, colds, or pain while you are taking this medicine without asking your doctor or health care professional for advice. Some ingredients may increase your blood pressure. What side effects may I notice from receiving this medicine? Side effects that you should report to your doctor or health care professional as soon as possible:  blurred vision  dry mouth  skin rash  sweating  the feeling of extreme pressure in the head  unusually weak or tired Side effects that usually do not require medical attention (report to your doctor or health care professional if they continue or are bothersome):  flushing of the face or neck  headache  irregular heartbeat, palpitations  nausea, vomiting This list may not describe all possible side effects. Call your doctor for medical advice about side effects. You may report side effects to FDA at 1-800-FDA-1088. Where should I keep my medicine? Keep out of the reach of children. Store at room temperature between 20 and 25 degrees C (68 and 77 degrees F). Store in [facility] of Staff. Protect from light and moisture. Keep tightly closed. Throw away any unused medicine after the expiration date. NOTE: This sheet is a summary. It may not cover all possible information. If you have questions about this medicine, talk to your doctor, pharmacist, or health care provider.  2020 Elsevier/Gold Standard (2013-05-19 17:57:36)

## 2020-02-29 ENCOUNTER — Telehealth (HOSPITAL_COMMUNITY): Payer: Self-pay | Admitting: *Deleted

## 2020-02-29 NOTE — Telephone Encounter (Signed)
Patient given detailed instructions per Myocardial Perfusion Study Information Sheet for the test on 03/08/20 at 8:15. Patient notified to arrive 15 minutes early and that it is imperative to arrive on time for appointment to keep from having the test rescheduled.  If you need to cancel or reschedule your appointment, please call the office within 24 hours of your appointment. . Patient verbalized understanding.Cameron Willis

## 2020-03-08 ENCOUNTER — Other Ambulatory Visit: Payer: Self-pay

## 2020-03-08 ENCOUNTER — Ambulatory Visit (INDEPENDENT_AMBULATORY_CARE_PROVIDER_SITE_OTHER): Payer: Medicare HMO

## 2020-03-08 DIAGNOSIS — R079 Chest pain, unspecified: Secondary | ICD-10-CM | POA: Diagnosis not present

## 2020-03-08 DIAGNOSIS — R0600 Dyspnea, unspecified: Secondary | ICD-10-CM | POA: Diagnosis not present

## 2020-03-08 DIAGNOSIS — R0609 Other forms of dyspnea: Secondary | ICD-10-CM

## 2020-03-08 LAB — MYOCARDIAL PERFUSION IMAGING
LV dias vol: 82 mL (ref 62–150)
LV sys vol: 23 mL
Peak HR: 65 {beats}/min
Rest HR: 54 {beats}/min
SDS: 1
SRS: 9
SSS: 10
TID: 0.94

## 2020-03-08 MED ORDER — TECHNETIUM TC 99M TETROFOSMIN IV KIT
31.9000 | PACK | Freq: Once | INTRAVENOUS | Status: AC | PRN
Start: 2020-03-08 — End: 2020-03-08
  Administered 2020-03-08: 31.9 via INTRAVENOUS

## 2020-03-08 MED ORDER — TECHNETIUM TC 99M TETROFOSMIN IV KIT
10.1000 | PACK | Freq: Once | INTRAVENOUS | Status: AC | PRN
  Administered 2020-03-08: 10.1 via INTRAVENOUS

## 2020-03-08 MED ORDER — AMINOPHYLLINE 25 MG/ML IV SOLN
75.0000 mg | Freq: Once | INTRAVENOUS | Status: AC
  Administered 2020-03-08: 75 mg via INTRAVENOUS

## 2020-03-08 MED ORDER — REGADENOSON 0.4 MG/5ML IV SOLN
0.4000 mg | Freq: Once | INTRAVENOUS | Status: AC
  Administered 2020-03-08: 0.4 mg via INTRAVENOUS

## 2020-03-09 ENCOUNTER — Telehealth: Payer: Self-pay

## 2020-03-09 NOTE — Telephone Encounter (Signed)
-----   Message from Berniece Salines, DO sent at 03/09/2020  8:18 AM EDT ----- Normal stress test.

## 2020-03-09 NOTE — Telephone Encounter (Signed)
Left message on patients voicemail to please return our call.   

## 2020-03-15 ENCOUNTER — Ambulatory Visit (INDEPENDENT_AMBULATORY_CARE_PROVIDER_SITE_OTHER): Payer: Medicare HMO

## 2020-03-15 ENCOUNTER — Other Ambulatory Visit: Payer: Self-pay

## 2020-03-15 ENCOUNTER — Telehealth: Payer: Self-pay

## 2020-03-15 ENCOUNTER — Telehealth: Payer: Self-pay | Admitting: Cardiology

## 2020-03-15 DIAGNOSIS — I4821 Permanent atrial fibrillation: Secondary | ICD-10-CM | POA: Diagnosis not present

## 2020-03-15 DIAGNOSIS — I482 Chronic atrial fibrillation, unspecified: Secondary | ICD-10-CM | POA: Diagnosis not present

## 2020-03-15 DIAGNOSIS — R0600 Dyspnea, unspecified: Secondary | ICD-10-CM | POA: Diagnosis not present

## 2020-03-15 DIAGNOSIS — R0609 Other forms of dyspnea: Secondary | ICD-10-CM

## 2020-03-15 LAB — ECHOCARDIOGRAM COMPLETE: S' Lateral: 3 cm

## 2020-03-15 NOTE — Telephone Encounter (Signed)
New Message:   Please call asap, pt have abnormal Zio Patch reading

## 2020-03-15 NOTE — Progress Notes (Signed)
Complete echocardiogram performed.  Jimmy Cadi Rhinehart RDCS, RVT  

## 2020-03-15 NOTE — Telephone Encounter (Signed)
Thank you :)

## 2020-03-15 NOTE — Telephone Encounter (Signed)
-----   Message from Berniece Salines, DO sent at 03/15/2020  3:34 PM EDT ----- His echo showed normal EF.  His left atrium is moderately dilated but otherwise normal echo.

## 2020-03-15 NOTE — Telephone Encounter (Signed)
Left message on patients voicemail to please return our call.   

## 2020-03-15 NOTE — Telephone Encounter (Signed)
I rhythm called with abnormal reading of slow afib rate 33 for 60 seconds on 03/01/20 at 6:32 am should be able to see on strip number 10 Will forward to Dr Harriet Masson for review./cy

## 2020-03-16 ENCOUNTER — Telehealth: Payer: Self-pay

## 2020-03-16 NOTE — Telephone Encounter (Signed)
-----   Message from Berniece Salines, DO sent at 03/15/2020  3:34 PM EDT ----- His echo showed normal EF.  His left atrium is moderately dilated but otherwise normal echo.

## 2020-03-16 NOTE — Telephone Encounter (Signed)
Left message on patients voicemail to please return our call.   

## 2020-03-19 ENCOUNTER — Telehealth: Payer: Self-pay

## 2020-03-19 NOTE — Telephone Encounter (Signed)
-----   Message from Berniece Salines, DO sent at 03/18/2020 10:46 PM EDT ----- Have the patient come in earlier the 9/16 so I can discuss his monitor result. The next two weeks will be fine.

## 2020-03-19 NOTE — Telephone Encounter (Signed)
Left message on patients voicemail to please return our call.   

## 2020-03-20 ENCOUNTER — Telehealth: Payer: Self-pay

## 2020-03-20 NOTE — Telephone Encounter (Signed)
-----   Message from Berniece Salines, DO sent at 03/15/2020  3:34 PM EDT ----- His echo showed normal EF.  His left atrium is moderately dilated but otherwise normal echo.

## 2020-03-20 NOTE — Telephone Encounter (Signed)
Spoke with patient regarding results and recommendation.  Patient verbalizes understanding and is agreeable to plan of care. Advised patient to call back with any issues or concerns.  

## 2020-04-02 ENCOUNTER — Ambulatory Visit: Payer: Medicare HMO | Admitting: Cardiology

## 2020-04-02 ENCOUNTER — Other Ambulatory Visit: Payer: Self-pay

## 2020-04-05 ENCOUNTER — Encounter: Payer: Self-pay | Admitting: Cardiology

## 2020-04-05 ENCOUNTER — Ambulatory Visit: Payer: Medicare HMO | Admitting: Cardiology

## 2020-04-05 ENCOUNTER — Other Ambulatory Visit: Payer: Self-pay

## 2020-04-05 VITALS — BP 136/72 | HR 52 | Ht 71.5 in | Wt 179.0 lb

## 2020-04-05 DIAGNOSIS — I4729 Other ventricular tachycardia: Secondary | ICD-10-CM

## 2020-04-05 DIAGNOSIS — I10 Essential (primary) hypertension: Secondary | ICD-10-CM | POA: Diagnosis not present

## 2020-04-05 DIAGNOSIS — I4819 Other persistent atrial fibrillation: Secondary | ICD-10-CM | POA: Diagnosis not present

## 2020-04-05 DIAGNOSIS — E785 Hyperlipidemia, unspecified: Secondary | ICD-10-CM

## 2020-04-05 DIAGNOSIS — I472 Ventricular tachycardia: Secondary | ICD-10-CM

## 2020-04-05 DIAGNOSIS — I2510 Atherosclerotic heart disease of native coronary artery without angina pectoris: Secondary | ICD-10-CM | POA: Diagnosis not present

## 2020-04-05 HISTORY — DX: Ventricular tachycardia: I47.2

## 2020-04-05 HISTORY — DX: Other ventricular tachycardia: I47.29

## 2020-04-05 LAB — HEPATIC FUNCTION PANEL
ALT: 8 IU/L (ref 0–44)
AST: 12 IU/L (ref 0–40)
Albumin: 4.4 g/dL (ref 3.6–4.6)
Alkaline Phosphatase: 91 IU/L (ref 48–121)
Bilirubin Total: 0.4 mg/dL (ref 0.0–1.2)
Bilirubin, Direct: 0.15 mg/dL (ref 0.00–0.40)
Total Protein: 7.9 g/dL (ref 6.0–8.5)

## 2020-04-05 LAB — BASIC METABOLIC PANEL
BUN/Creatinine Ratio: 12 (ref 10–24)
BUN: 18 mg/dL (ref 8–27)
CO2: 23 mmol/L (ref 20–29)
Calcium: 9.8 mg/dL (ref 8.6–10.2)
Chloride: 102 mmol/L (ref 96–106)
Creatinine, Ser: 1.46 mg/dL — ABNORMAL HIGH (ref 0.76–1.27)
GFR calc Af Amer: 50 mL/min/{1.73_m2} — ABNORMAL LOW (ref >59)
GFR calc non Af Amer: 44 mL/min/{1.73_m2} — ABNORMAL LOW (ref >59)
Glucose: 129 mg/dL — ABNORMAL HIGH (ref 65–99)
Potassium: 4.5 mmol/L (ref 3.5–5.2)
Sodium: 139 mmol/L (ref 134–144)

## 2020-04-05 LAB — MAGNESIUM: Magnesium: 2.3 mg/dL (ref 1.6–2.3)

## 2020-04-05 MED ORDER — AMIODARONE HCL 200 MG PO TABS: ORAL_TABLET | ORAL | 3 refills | Status: DC

## 2020-04-05 NOTE — Progress Notes (Signed)
Cardiology Office Note:    Date:  04/05/2020   ID:  Cameron Willis, DOB 08/25/1933, MRN [id number]  PCP:  Townsend Roger, MD  Cardiologist:  Berniece Salines, DO  Electrophysiologist:  None   Referring MD: Townsend Roger, MD   Follow up visit  History of Present Illness:    Cameron Willis is a 86 y.o. male with a hx of coronary artery disease status post CABG in 2013, hypertension, hyperlipidemia, permanent atrial fibrillation on Eliquis and COPD presents today for reestablishment of cardiac care.  Past Medical History:  Diagnosis Date  . Anxiety and depression   . Cervical disc disorder at C4-C5 level with myelopathy 12/05/2016  . Contracture of right knee 12/05/2016  . COPD (chronic obstructive pulmonary disease) ([place]) 03/22/2012  . Coronary artery disease involving native coronary artery of native heart without angina pectoris 03/22/2012   Overview:  Coronary artery bypass graft August 2013 LIMA to LAD and SVG to RCA and SVG to obtuse marginal  . Dyslipidemia 03/26/2015  . Esophageal stenosis   . Gait disturbance 10/14/2017  . GERD (gastroesophageal reflux disease) 03/22/2012  . Hemorrhoids   . History of degenerative disc disease   . HTN (hypertension) 03/22/2012  . Hypertrophy of prostate   . MI, old 03/22/2012  . Osteoarthrosis   . Paroxysmal atrial fibrillation ([place]) 03/26/2015   Overview:  Chads score 2, was anticoagulated but developed tarry stool optic admission was withdrawn for now scheduled to see gastroenterologist  . Pre-op evaluation 03/25/2016  . S/P CABG x 3 03/25/2012  . Tobacco abuse 03/22/2012   Overview:  Quit 2 years ago    Past Surgical History:  Procedure Laterality Date  . CARDIAC CATHETERIZATION    . COLONOSCOPY  05/07/2015   Colonic polyps status post polypectomy. Internal hemorrhoids ( likely etiology of rectal bleeding)   . CORONARY ARTERY BYPASS GRAFT    . ESOPHAGOGASTRODUODENOSCOPY  11/06/2005   [place] hiatal hernia. Moderate gastritis  . HEMORRHOID SURGERY   1996    Current Medications: Current Meds  Medication Sig  . ELIQUIS 5 MG TABS tablet Take 1 tablet (5 mg total) by mouth 2 (two) times daily.  . meclizine (ANTIVERT) 12.5 MG tablet Take 1 tablet by mouth as needed for dizziness.  . nitroGLYCERIN (NITROSTAT) 0.4 MG SL tablet Place 1 tablet (0.4 mg total) under the tongue as needed for chest pain.  . pantoprazole (PROTONIX) 40 MG tablet Take 40 mg by mouth daily.  . pravastatin (PRAVACHOL) 80 MG tablet Take 80 mg by mouth at bedtime.  . traMADol (ULTRAM) 50 MG tablet Take 50 mg by mouth at bedtime as needed.     Allergies:   Aciphex [rabeprazole sodium], Lipitor [atorvastatin], Meclizine, Omeprazole, and Pravachol [pravastatin]   Social History   Socioeconomic History  . Marital status: Married    Spouse name: Not on file  . Number of children: Not on file  . Years of education: Not on file  . Highest education level: Not on file  Occupational History  . Not on file  Tobacco Use  . Smoking status: Former Research scientist (life sciences)  . Smokeless tobacco: Never Used  Vaping Use  . Vaping Use: Never used  Substance and Sexual Activity  . Alcohol use: No  . Drug use: No  . Sexual activity: Not on file  Other Topics Concern  . Not on file  Social History Narrative  . Not on file   Social Determinants of Health   Financial Resource Strain:   .  Difficulty of Paying Living Expenses: Not on file  Food Insecurity:   . Worried About Charity fundraiser in the Last Year: Not on file  . Ran Out of Food in the Last Year: Not on file  Transportation Needs:   . Lack of Transportation (Medical): Not on file  . Lack of Transportation (Non-Medical): Not on file  Physical Activity:   . Days of Exercise per Week: Not on file  . Minutes of Exercise per Session: Not on file  Stress:   . Feeling of Stress : Not on file  Social Connections:   . Frequency of Communication with Friends and Family: Not on file  . Frequency of Social Gatherings with Friends  and Family: Not on file  . Attends Religious Services: Not on file  . Active Member of Clubs or Organizations: Not on file  . Attends Archivist Meetings: Not on file  . Marital Status: Not on file     Family History: The patient's family history includes Cancer in his father; Esophageal cancer in his father. There is no history of Colon cancer.  ROS:   Review of Systems  Constitution: Negative for decreased appetite, fever and weight gain.  HENT: Negative for congestion, ear discharge, hoarse voice and sore throat.   Eyes: Negative for discharge, redness, vision loss in right eye and visual halos.  Cardiovascular: Negative for chest pain, dyspnea on exertion, leg swelling, orthopnea and palpitations.  Respiratory: Negative for cough, hemoptysis, shortness of breath and snoring.   Endocrine: Negative for heat intolerance and polyphagia.  Hematologic/Lymphatic: Negative for bleeding problem. Does not bruise/bleed easily.  Skin: Negative for flushing, nail changes, rash and suspicious lesions.  Musculoskeletal: Negative for arthritis, joint pain, muscle cramps, myalgias, neck pain and stiffness.  Gastrointestinal: Negative for abdominal pain, bowel incontinence, diarrhea and excessive appetite.  Genitourinary: Negative for decreased libido, genital sores and incomplete emptying.  Neurological: Negative for brief paralysis, focal weakness, headaches and loss of balance.  Psychiatric/Behavioral: Negative for altered mental status, depression and suicidal ideas.  Allergic/Immunologic: Negative for HIV exposure and persistent infections.    EKGs/Labs/Other Studies Reviewed:    The following studies were reviewed today:   EKG:  The ekg ordered today demonstrates   Zio monitor  The patient wore the monitor for 6 days 20 hours starting February 23, 2020. Indication: Chronic anticoagulation  The minimum heart rate was 28 bpm, maximum heart rate was 148 bpm, and average heart rate  was 48 bpm.  Atrial Fibrillation occurred continuously (100%burden), ranging from 28-94 bpm (average of 48 bpm). Intermittent Bundle Branch Block was present.   27 Pauses occurred, the longest lasting 3.6 seconds (17 bpm).  1 run of Ventricular Tachycardia occurred lasting 16 beats with a maximum heart rate of 146 bpm (average 116 bpm).   Premature atrial complexes were rare. Premature Ventricular complexes rare.  1 patient triggered event noted associated with atrial fibrillation.  Conclusion: This study is remarkable for the following:                                   1. Chronic atrial fibrillation.                             2. Total of 27 pauses while in atrial fibrillation with the longest pause at 3.6 seconds.  3. 1 episode of ventricular tachycardia (16 beat run)   Echo IMPRESSIONS:  1. Left ventricular ejection fraction, by estimation, is 60 to 65%. The left ventricle has normal function. The left ventricle has no regional wall motion abnormalities. Left ventricular diastolic parameters are indeterminate.  2. Left atrial size was moderately dilated.  3. Rhythm: atrial fibrillation.   Pharmacologic stress test:  Nuclear stress EF: 71%.  The left ventricular ejection fraction is hyperdynamic (>65%).  There was no ST segment deviation noted during stress.  Defect 1: There is a medium defect of moderate severity present in the basal inferior and basal inferolateral location.  This is a low risk study.  No ischemia or MI.  Diaphragmatic attenuation.  Hyperdynamic LV.  Recent Labs: No results found for requested labs within last 8760 hours.  Recent Lipid Panel No results found for: CHOL, TRIG, HDL, CHOLHDL, VLDL, LDLCALC, LDLDIRECT  Physical Exam:    VS:  BP 136/72   Pulse (!) 52   Ht 5' 11.5" (1.816 m)   Wt 179 lb (81.2 kg)   BMI 24.62 kg/m     Wt Readings from Last 3 Encounters:  04/05/20 179 lb (81.2 kg)  03/08/20  169 lb (76.7 kg)  03/01/18 169 lb (76.7 kg)     GEN: Well nourished, well developed in no acute distress HEENT: Normal NECK: No JVD; No carotid bruits LYMPHATICS: No lymphadenopathy CARDIAC: S1S2 noted,RRR, no murmurs, rubs, gallops RESPIRATORY:  Clear to auscultation without rales, wheezing or rhonchi  ABDOMEN: Soft, non-tender, non-distended, +bowel sounds, no guarding. EXTREMITIES: No edema, No cyanosis, no clubbing MUSCULOSKELETAL:  No deformity  SKIN: Warm and dry NEUROLOGIC:  Alert and oriented x 3, non-focal PSYCHIATRIC:  Normal affect, good insight  ASSESSMENT:    1. Persistent atrial fibrillation ([place])   2. Essential hypertension   3. Coronary artery disease involving native coronary artery of native heart without angina pectoris   4. Dyslipidemia   5. NSVT (nonsustained ventricular tachycardia) ([facility])    PLAN:    He does have chronic atrial fibrillation which was seen on the monitor.  He had a few pauses less than 5 seconds the most was 3.6-second pause.  However there is a few beats of nonsustained ventricular tachycardia.  He had a recent stress test which was normal. For NSVT I like to start the patient on amiodarone.  I talked to him about this he is agreeable to proceed.  We will start amiodarone.  Blood work will be done today which will include LFTs.  We will continue patient on his Eliquis as well.  Continue pravastatin.  No anginal symptoms today.  The patient is in agreement with the above plan. The patient left the office in stable condition.  The patient will follow up in 4 weeks or sooner if needed.   Medication Adjustments/Labs and Tests Ordered: Current medicines are reviewed at length with the patient today.  Concerns regarding medicines are outlined above.  Orders Placed This Encounter  Procedures  . Basic metabolic panel  . Magnesium  . Hepatic function panel   Meds ordered this encounter  Medications  . amiodarone (PACERONE) 200 MG tablet     Sig: Take 200 mg per one tablet by mouth twice daily for three days. Then take 200 mg per one tablet by mouth once daily.    Dispense:  90 tablet    Refill:  3    Patient Instructions  Medication Instructions:  Your physician has recommended you make the following  change in your medication:  START: Amiodarone 200 gm take one tablet by mouth twice daily for 3 days. Then take one tablet by mouth daily.  *If you need a refill on your cardiac medications before your next appointment, please call your pharmacy*   Lab Work: Your physician recommends that you return for lab work in: [place], LFT If you have labs (blood work) drawn today and your tests are completely normal, you will receive your results only by: Marland Kitchen MyChart Message (if you have MyChart) OR . A paper copy in the mail If you have any lab test that is abnormal or we need to change your treatment, we will call you to review the results.   Testing/Procedures: None   Follow-Up: At [facility], you and your health needs are our priority.  As part of our continuing mission to provide you with exceptional heart care, we have created designated Provider Care Teams.  These Care Teams include your primary Cardiologist (physician) and Advanced Practice Providers (APPs -  Physician Assistants and Nurse Practitioners) who all work together to provide you with the care you need, when you need it.  We recommend signing up for the patient portal called "MyChart".  Sign up information is provided on this After Visit Summary.  MyChart is used to connect with patients for Virtual Visits (Telemedicine).  Patients are able to view lab/test results, encounter notes, upcoming appointments, etc.  Non-urgent messages can be sent to your provider as well.   To learn more about what you can do with MyChart, go to [email or web address].    Your next appointment:   4 week(s)  The format for your next appointment:   In Person  Provider:    Dr. Harriet Masson   Other Instructions      Adopting a Healthy Lifestyle.  Know what a healthy weight is for you (roughly BMI <25) and aim to maintain this   Aim for 7+ servings of fruits and vegetables daily   65-80+ fluid ounces of water or unsweet tea for healthy kidneys   Limit to max 1 drink of alcohol per day; avoid smoking/tobacco   Limit animal fats in diet for cholesterol and heart health - choose grass fed whenever available   Avoid highly processed foods, and foods high in saturated/trans fats   Aim for low stress - take time to unwind and care for your mental health   Aim for 150 min of moderate intensity exercise weekly for heart health, and weights twice weekly for bone health   Aim for 7-9 hours of sleep daily   When it comes to diets, agreement about the perfect plan isnt easy to find, even among the experts. Experts at the [employer] developed an idea known as the Healthy Eating Plate. Just imagine a plate divided into logical, healthy portions.   The emphasis is on diet quality:   Load up on vegetables and fruits - one-half of your plate: Aim for color and variety, and remember that potatoes dont count.   Go for whole grains - one-quarter of your plate: Whole wheat, barley, wheat berries, quinoa, oats, brown rice, and foods made with them. If you want pasta, go with whole wheat pasta.   Protein power - one-quarter of your plate: Fish, chicken, beans, and nuts are all healthy, versatile protein sources. Limit red meat.   The diet, however, does go beyond the plate, offering a few other suggestions.   Use healthy plant oils, such  as olive, canola, soy, corn, sunflower and peanut. Check the labels, and avoid partially hydrogenated oil, which have unhealthy trans fats.   If youre thirsty, drink water. Coffee and tea are good in moderation, but skip sugary drinks and limit milk and dairy products to one or two daily servings.   The type of  carbohydrate in the diet is more important than the amount. Some sources of carbohydrates, such as vegetables, fruits, whole grains, and beans-are healthier than others.   Finally, stay active  Signed, Berniece Salines, DO  04/05/2020 11:29 AM    [facility]

## 2020-04-05 NOTE — Patient Instructions (Signed)
Medication Instructions:  Your physician has recommended you make the following change in your medication:  START: Amiodarone 200 gm take one tablet by mouth twice daily for 3 days. Then take one tablet by mouth daily.  *If you need a refill on your cardiac medications before your next appointment, please call your pharmacy*   Lab Work: Your physician recommends that you return for lab work in: [place], LFT If you have labs (blood work) drawn today and your tests are completely normal, you will receive your results only by: Marland Kitchen MyChart Message (if you have MyChart) OR . A paper copy in the mail If you have any lab test that is abnormal or we need to change your treatment, we will call you to review the results.   Testing/Procedures: None   Follow-Up: At [facility], you and your health needs are our priority.  As part of our continuing mission to provide you with exceptional heart care, we have created designated Provider Care Teams.  These Care Teams include your primary Cardiologist (physician) and Advanced Practice Providers (APPs -  Physician Assistants and Nurse Practitioners) who all work together to provide you with the care you need, when you need it.  We recommend signing up for the patient portal called "MyChart".  Sign up information is provided on this After Visit Summary.  MyChart is used to connect with patients for Virtual Visits (Telemedicine).  Patients are able to view lab/test results, encounter notes, upcoming appointments, etc.  Non-urgent messages can be sent to your provider as well.   To learn more about what you can do with MyChart, go to [email or web address].    Your next appointment:   4 week(s)  The format for your next appointment:   In Person  Provider:   Dr. Harriet Masson   Other Instructions

## 2020-04-06 ENCOUNTER — Telehealth: Payer: Self-pay

## 2020-04-06 NOTE — Telephone Encounter (Signed)
Left message on patients voicemail to please return our call.   

## 2020-04-06 NOTE — Telephone Encounter (Signed)
-----   Message from Berniece Salines, DO sent at 04/05/2020 10:43 PM EDT ----- Cr is elevated. Compared to 2 year ago. We will monitor.

## 2020-04-12 DIAGNOSIS — R350 Frequency of micturition: Secondary | ICD-10-CM | POA: Diagnosis not present

## 2020-04-12 DIAGNOSIS — R300 Dysuria: Secondary | ICD-10-CM | POA: Diagnosis not present

## 2020-04-19 ENCOUNTER — Ambulatory Visit: Payer: Medicare HMO | Admitting: Cardiology

## 2020-05-02 DIAGNOSIS — N400 Enlarged prostate without lower urinary tract symptoms: Secondary | ICD-10-CM | POA: Insufficient documentation

## 2020-05-02 DIAGNOSIS — Z8739 Personal history of other diseases of the musculoskeletal system and connective tissue: Secondary | ICD-10-CM | POA: Insufficient documentation

## 2020-05-02 DIAGNOSIS — K222 Esophageal obstruction: Secondary | ICD-10-CM | POA: Insufficient documentation

## 2020-05-02 DIAGNOSIS — M1990 Unspecified osteoarthritis, unspecified site: Secondary | ICD-10-CM | POA: Insufficient documentation

## 2020-05-02 DIAGNOSIS — K649 Unspecified hemorrhoids: Secondary | ICD-10-CM | POA: Insufficient documentation

## 2020-05-02 DIAGNOSIS — F419 Anxiety disorder, unspecified: Secondary | ICD-10-CM | POA: Insufficient documentation

## 2020-05-03 ENCOUNTER — Encounter: Payer: Self-pay | Admitting: Cardiology

## 2020-05-03 ENCOUNTER — Other Ambulatory Visit: Payer: Self-pay

## 2020-05-03 ENCOUNTER — Ambulatory Visit (INDEPENDENT_AMBULATORY_CARE_PROVIDER_SITE_OTHER): Payer: Medicare HMO | Admitting: Cardiology

## 2020-05-03 VITALS — BP 142/78 | HR 46 | Ht 72.0 in | Wt 170.0 lb

## 2020-05-03 DIAGNOSIS — I10 Essential (primary) hypertension: Secondary | ICD-10-CM | POA: Diagnosis not present

## 2020-05-03 DIAGNOSIS — I4819 Other persistent atrial fibrillation: Secondary | ICD-10-CM | POA: Diagnosis not present

## 2020-05-03 DIAGNOSIS — Z79899 Other long term (current) drug therapy: Secondary | ICD-10-CM | POA: Diagnosis not present

## 2020-05-03 DIAGNOSIS — I2510 Atherosclerotic heart disease of native coronary artery without angina pectoris: Secondary | ICD-10-CM

## 2020-05-03 NOTE — Progress Notes (Signed)
Cardiology Office Note:    Date:  05/03/2020   ID:  Cameron Willis, DOB 02/03/1934, MRN [id number]  PCP:  Townsend Roger, MD  Cardiologist:  Berniece Salines, DO  Electrophysiologist:  None   Referring MD: Townsend Roger, MD   " follow up visit"   History of Present Illness:    Cameron Willis is a 86 y.o. male with a hx of coronary artery disease status post CABG in 2013, hypertension, hyperlipidemia, permanent atrial fibrillation on Eliquis and COPD.   I did see the patient on 04/05/2020 at that time we discussed his monitor results   Past Medical History:  Diagnosis Date   Anxiety and depression    Bradycardia by electrocardiogram 01/20/2018   Cervical disc disorder at C4-C5 level with myelopathy 12/05/2016   Chronic atrial fibrillation ([place]) 03/01/2018   Contracture of right knee 12/05/2016   COPD (chronic obstructive pulmonary disease) ([place]) 03/22/2012   Coronary artery disease involving native coronary artery of native heart without angina pectoris 03/22/2012   Overview:  Coronary artery bypass graft August 2013 [place] to LAD and SVG to RCA and SVG to obtuse marginal   Dyslipidemia 03/26/2015   Esophageal stenosis    Gait disturbance 10/14/2017   GERD (gastroesophageal reflux disease) 03/22/2012   Hemorrhoids    History of degenerative disc disease    HTN (hypertension) 03/22/2012   Hypertrophy of prostate    MI, old 03/22/2012   NSVT (nonsustained ventricular tachycardia) ([place]) 04/05/2020   Osteoarthrosis    Paroxysmal atrial fibrillation ([place]) 03/26/2015   Overview:  Chads score 2, was anticoagulated but developed tarry stool optic admission was withdrawn for now scheduled to see gastroenterologist   Persistent atrial fibrillation ([place]) 01/20/2018   Pre-op evaluation 03/25/2016   S/P CABG x 3 03/25/2012   Tobacco abuse 03/22/2012   Overview:  Quit 2 years ago    Past Surgical History:  Procedure Laterality Date   CARDIAC CATHETERIZATION     COLONOSCOPY  05/07/2015    Colonic polyps status post polypectomy. Internal hemorrhoids ( likely etiology of rectal bleeding)    CORONARY ARTERY BYPASS GRAFT     ESOPHAGOGASTRODUODENOSCOPY  11/06/2005   Smal hiatal hernia. Moderate gastritis   HEMORRHOID SURGERY  1996    Current Medications: Current Meds  Medication Sig   amiodarone (PACERONE) 200 MG tablet Take 200 mg per one tablet by mouth twice daily for three days. Then take 200 mg per one tablet by mouth once daily.   ELIQUIS 5 MG TABS tablet Take 1 tablet (5 mg total) by mouth 2 (two) times daily.   meclizine (ANTIVERT) 12.5 MG tablet Take 1 tablet by mouth as needed for dizziness.   nitroGLYCERIN (NITROSTAT) 0.4 MG SL tablet Place 1 tablet (0.4 mg total) under the tongue as needed for chest pain.   pantoprazole (PROTONIX) 40 MG tablet Take 40 mg by mouth daily.   pravastatin (PRAVACHOL) 80 MG tablet Take 80 mg by mouth at bedtime.   traMADol (ULTRAM) 50 MG tablet Take 50 mg by mouth at bedtime as needed.     Allergies:   Aciphex [rabeprazole sodium], Lipitor [atorvastatin], Omeprazole, and Pravachol [pravastatin]   Social History   Socioeconomic History   Marital status: Married    Spouse name: Not on file   Number of children: Not on file   Years of education: Not on file   Highest education level: Not on file  Occupational History   Not on file  Tobacco Use  Smoking status: Former Smoker   Smokeless tobacco: Never Used  Scientific laboratory technician Use: Never used  Substance and Sexual Activity   Alcohol use: No   Drug use: No   Sexual activity: Not on file  Other Topics Concern   Not on file  Social History Narrative   Not on file   Social Determinants of Health   Financial Resource Strain:    Difficulty of Paying Living Expenses: Not on file  Food Insecurity:    Worried About Charity fundraiser in the Last Year: Not on file   [employer] of Food in the Last Year: Not on file  Transportation Needs:    Lack of  Transportation (Medical): Not on file   Lack of Transportation (Non-Medical): Not on file  Physical Activity:    Days of Exercise per Week: Not on file   Minutes of Exercise per Session: Not on file  Stress:    Feeling of Stress : Not on file  Social Connections:    Frequency of Communication with Friends and Family: Not on file   Frequency of Social Gatherings with Friends and Family: Not on file   Attends Religious Services: Not on file   Active Member of Clubs or Organizations: Not on file   Attends Archivist Meetings: Not on file   Marital Status: Not on file     Family History: The patient's family history includes Cancer in his father; Esophageal cancer in his father. There is no history of Colon cancer.  ROS:   Review of Systems  Constitution: Negative for decreased appetite, fever and weight gain.  HENT: Negative for congestion, ear discharge, hoarse voice and sore throat.   Eyes: Negative for discharge, redness, vision loss in right eye and visual halos.  Cardiovascular: Negative for chest pain, dyspnea on exertion, leg swelling, orthopnea and palpitations.  Respiratory: Negative for cough, hemoptysis, shortness of breath and snoring.   Endocrine: Negative for heat intolerance and polyphagia.  Hematologic/Lymphatic: Negative for bleeding problem. Does not bruise/bleed easily.  Skin: Negative for flushing, nail changes, rash and suspicious lesions.  Musculoskeletal: Negative for arthritis, joint pain, muscle cramps, myalgias, neck pain and stiffness.  Gastrointestinal: Negative for abdominal pain, bowel incontinence, diarrhea and excessive appetite.  Genitourinary: Negative for decreased libido, genital sores and incomplete emptying.  Neurological: Negative for brief paralysis, focal weakness, headaches and loss of balance.  Psychiatric/Behavioral: Negative for altered mental status, depression and suicidal ideas.  Allergic/Immunologic: Negative for HIV  exposure and persistent infections.    EKGs/Labs/Other Studies Reviewed:    The following studies were reviewed today:   EKG:  The ekg ordered today demonstrates atrial fibrillation, heart rate 46 bpm compared to prior EKG no significant change.  Zio monitor  The patient wore the monitor for 6 days 20 hours starting February 23, 2020. Indication: Chronic anticoagulation  The minimum heart rate was 28 bpm, maximum heart rate was 148 bpm, and average heart rate was 48 bpm.  Atrial Fibrillation occurred continuously (100%burden), ranging from 28-94 bpm (average of 48 bpm). Intermittent Bundle Branch Block was present.   27 Pauses occurred, the longest lasting 3.6 seconds (17 bpm).  1 run of Ventricular Tachycardia occurred lasting 16 beats with a maximum heart rate of 146 bpm (average 116 bpm).   Premature atrial complexes were rare. Premature Ventricular complexes rare.  1 patient triggered event noted associated with atrial fibrillation.  Conclusion: This study is remarkable for the following:  1. Chronic atrial  fibrillation. 2. Total of 27 pauses while in atrial fibrillation with the longest pause at 3.6 seconds. 3. 1 episode of ventricular tachycardia (16 beat run)   Echo IMPRESSIONS:  1. Left ventricular ejection fraction, by estimation, is 60 to 65%. The left ventricle has normal function. The left ventricle has no regional wall motion abnormalities. Left ventricular diastolic parameters are indeterminate.  2. Left atrial size was moderately dilated.  3. Rhythm: atrial fibrillation.   Pharmacologic stress test:  Nuclear stress EF: 71%.  The left ventricular ejection fraction is hyperdynamic (>65%).  There was no ST segment deviation noted during stress.  Defect 1: There is a medium defect of moderate severity present in the basal inferior and basal inferolateral  location.  This is a low risk study.  No ischemia or MI.  Diaphragmatic attenuation.  Hyperdynamic LV.  Recent Labs: 04/05/2020: ALT 8; BUN 18; Creatinine, Ser 1.46; Magnesium 2.3; Potassium 4.5; Sodium 139  Recent Lipid Panel No results found for: CHOL, TRIG, HDL, CHOLHDL, VLDL, LDLCALC, LDLDIRECT  Physical Exam:    VS:  BP (!) 142/78    Pulse (!) 46    Ht 6' (1.829 m)    Wt 170 lb (77.1 kg)    SpO2 94%    BMI 23.06 kg/m     Wt Readings from Last 3 Encounters:  05/03/20 170 lb (77.1 kg)  04/05/20 179 lb (81.2 kg)  03/08/20 169 lb (76.7 kg)     GEN: Well nourished, well developed in no acute distress HEENT: Normal NECK: No JVD; No carotid bruits LYMPHATICS: No lymphadenopathy CARDIAC: S1S2 noted,RRR, no murmurs, rubs, gallops RESPIRATORY:  Clear to auscultation without rales, wheezing or rhonchi  ABDOMEN: Soft, non-tender, non-distended, +bowel sounds, no guarding. EXTREMITIES: No edema, No cyanosis, no clubbing MUSCULOSKELETAL:  No deformity  SKIN: Warm and dry NEUROLOGIC:  Alert and oriented x 3, non-focal PSYCHIATRIC:  Normal affect, good insight  ASSESSMENT:    1. Medication management   2. Coronary artery disease involving native coronary artery of native heart without angina pectoris   3. Essential hypertension   4. Persistent atrial fibrillation (HCC)    PLAN:     He is asymptomatic today.  Can continue patient his current medication regimen.  Is unclear if he is taking his amiodarone twice a day or once daily.  I reminded patient that he should be taking his amiodarone 200 mg once daily.  We will get blood work today to assess kidney function as well as electrolytes.  He will continue his Eliquis.  Given his low heart rate we will hold off on any beta-blocker or calcium channel blocker at this time.  The patient is in agreement with the above plan. The patient left the office in stable condition.  The patient will follow up inFollow-up visit 1 month or  sooner if needed.   Medication Adjustments/Labs and Tests Ordered: Current medicines are reviewed at length with the patient today.  Concerns regarding medicines are outlined above.  Orders Placed This Encounter  Procedures   Basic metabolic panel   Magnesium   EKG 12-Lead   No orders of the defined types were placed in this encounter.   Patient Instructions  Medication Instructions:  You should be taking Amiodarone 200 mg daily    *If you need a refill on your cardiac medications before your next appointment, please call your pharmacy*   Lab Work: Bmp, Mag- Today   If you have labs (blood work) drawn today and your tests are  completely normal, you will receive your results only by:  MyChart Message (if you have MyChart) OR  A paper copy in the mail If you have any lab test that is abnormal or we need to change your treatment, we will call you to review the results.   Testing/Procedures: None ordered    Follow-Up: At [facility], you and your health needs are our priority.  As part of our continuing mission to provide you with exceptional heart care, we have created designated Provider Care Teams.  These Care Teams include your primary Cardiologist (physician) and Advanced Practice Providers (APPs -  Physician Assistants and Nurse Practitioners) who all work together to provide you with the care you need, when you need it.  We recommend signing up for the patient portal called "MyChart".  Sign up information is provided on this After Visit Summary.  MyChart is used to connect with patients for Virtual Visits (Telemedicine).  Patients are able to view lab/test results, encounter notes, upcoming appointments, etc.  Non-urgent messages can be sent to your provider as well.   To learn more about what you can do with MyChart, go to [email or web address].    Your next appointment:   1 month(s)  The format for your next appointment:   In Person  Provider:   Berniece Salines, DO   Other Instructions None      Adopting a Healthy Lifestyle.  Know what a healthy weight is for you (roughly BMI <25) and aim to maintain this   Aim for 7+ servings of fruits and vegetables daily   65-80+ fluid ounces of water or unsweet tea for healthy kidneys   Limit to max 1 drink of alcohol per day; avoid smoking/tobacco   Limit animal fats in diet for cholesterol and heart health - choose grass fed whenever available   Avoid highly processed foods, and foods high in saturated/trans fats   Aim for low stress - take time to unwind and care for your mental health   Aim for 150 min of moderate intensity exercise weekly for heart health, and weights twice weekly for bone health   Aim for 7-9 hours of sleep daily   When it comes to diets, agreement about the perfect plan isnt easy to find, even among the experts. Experts at the [employer] developed an idea known as the Healthy Eating Plate. Just imagine a plate divided into logical, healthy portions.   The emphasis is on diet quality:   Load up on vegetables and fruits - one-half of your plate: Aim for color and variety, and remember that potatoes dont count.   Go for whole grains - one-quarter of your plate: Whole wheat, barley, wheat berries, quinoa, oats, brown rice, and foods made with them. If you want pasta, go with whole wheat pasta.   Protein power - one-quarter of your plate: Fish, chicken, beans, and nuts are all healthy, versatile protein sources. Limit red meat.   The diet, however, does go beyond the plate, offering a few other suggestions.   Use healthy plant oils, such as olive, canola, soy, corn, sunflower and peanut. Check the labels, and avoid partially hydrogenated oil, which have unhealthy trans fats.   If youre thirsty, drink water. Coffee and tea are good in moderation, but skip sugary drinks and limit milk and dairy products to one or two daily servings.   The type of  carbohydrate in the diet is more important than the amount. Some sources of  carbohydrates, such as vegetables, fruits, whole grains, and beans-are healthier than others.   Finally, stay active  Signed, Berniece Salines, DO  05/03/2020 12:06 PM    [place]

## 2020-05-03 NOTE — Patient Instructions (Signed)
Medication Instructions:  You should be taking Amiodarone 200 mg daily    *If you need a refill on your cardiac medications before your next appointment, please call your pharmacy*   Lab Work: Bmp, Mag- Today   If you have labs (blood work) drawn today and your tests are completely normal, you will receive your results only by: Marland Kitchen MyChart Message (if you have MyChart) OR . A paper copy in the mail If you have any lab test that is abnormal or we need to change your treatment, we will call you to review the results.   Testing/Procedures: None ordered    Follow-Up: At [facility], you and your health needs are our priority.  As part of our continuing mission to provide you with exceptional heart care, we have created designated Provider Care Teams.  These Care Teams include your primary Cardiologist (physician) and Advanced Practice Providers (APPs -  Physician Assistants and Nurse Practitioners) who all work together to provide you with the care you need, when you need it.  We recommend signing up for the patient portal called "MyChart".  Sign up information is provided on this After Visit Summary.  MyChart is used to connect with patients for Virtual Visits (Telemedicine).  Patients are able to view lab/test results, encounter notes, upcoming appointments, etc.  Non-urgent messages can be sent to your provider as well.   To learn more about what you can do with MyChart, go to [email or web address].    Your next appointment:   1 month(s)  The format for your next appointment:   In Person  Provider:   Berniece Salines, DO   Other Instructions None

## 2020-05-04 LAB — BASIC METABOLIC PANEL
BUN/Creatinine Ratio: 12 (ref 10–24)
BUN: 18 mg/dL (ref 8–27)
CO2: 23 mmol/L (ref 20–29)
Calcium: 9.4 mg/dL (ref 8.6–10.2)
Chloride: 105 mmol/L (ref 96–106)
Creatinine, Ser: 1.46 mg/dL — ABNORMAL HIGH (ref 0.76–1.27)
GFR calc Af Amer: 50 mL/min/{1.73_m2} — ABNORMAL LOW (ref >59)
GFR calc non Af Amer: 44 mL/min/{1.73_m2} — ABNORMAL LOW (ref >59)
Glucose: 123 mg/dL — ABNORMAL HIGH (ref 65–99)
Potassium: 4.6 mmol/L (ref 3.5–5.2)
Sodium: 142 mmol/L (ref 134–144)

## 2020-05-04 LAB — MAGNESIUM: Magnesium: 2.2 mg/dL (ref 1.6–2.3)

## 2020-05-07 ENCOUNTER — Telehealth: Payer: Self-pay

## 2020-05-07 NOTE — Telephone Encounter (Signed)
-----   Message from Berniece Salines, DO sent at 05/04/2020  8:12 AM EDT ----- Creatinine stable compared to 4 weeks ago 1.46

## 2020-05-07 NOTE — Telephone Encounter (Signed)
Left message on patients voicemail to please return our call.   

## 2020-05-08 ENCOUNTER — Telehealth: Payer: Self-pay

## 2020-05-08 NOTE — Telephone Encounter (Signed)
Left message on patients voicemail to please return our call.   I will send a letter with these results to the patient at this time after trying to reach him with no success x3

## 2020-05-08 NOTE — Telephone Encounter (Signed)
-----   Message from Berniece Salines, DO sent at 05/04/2020  8:12 AM EDT ----- Creatinine stable compared to 4 weeks ago 1.46

## 2020-06-01 DIAGNOSIS — M2550 Pain in unspecified joint: Secondary | ICD-10-CM | POA: Diagnosis not present

## 2020-06-01 DIAGNOSIS — R0902 Hypoxemia: Secondary | ICD-10-CM | POA: Diagnosis not present

## 2020-06-01 DIAGNOSIS — G319 Degenerative disease of nervous system, unspecified: Secondary | ICD-10-CM | POA: Diagnosis not present

## 2020-06-01 DIAGNOSIS — J440 Chronic obstructive pulmonary disease with acute lower respiratory infection: Secondary | ICD-10-CM | POA: Diagnosis not present

## 2020-06-01 DIAGNOSIS — R41841 Cognitive communication deficit: Secondary | ICD-10-CM | POA: Diagnosis not present

## 2020-06-01 DIAGNOSIS — G930 Cerebral cysts: Secondary | ICD-10-CM | POA: Diagnosis not present

## 2020-06-01 DIAGNOSIS — R4182 Altered mental status, unspecified: Secondary | ICD-10-CM | POA: Diagnosis not present

## 2020-06-01 DIAGNOSIS — I361 Nonrheumatic tricuspid (valve) insufficiency: Secondary | ICD-10-CM | POA: Diagnosis not present

## 2020-06-01 DIAGNOSIS — I4581 Long QT syndrome: Secondary | ICD-10-CM | POA: Diagnosis not present

## 2020-06-01 DIAGNOSIS — G9341 Metabolic encephalopathy: Secondary | ICD-10-CM | POA: Diagnosis not present

## 2020-06-01 DIAGNOSIS — R410 Disorientation, unspecified: Secondary | ICD-10-CM | POA: Diagnosis not present

## 2020-06-01 DIAGNOSIS — R404 Transient alteration of awareness: Secondary | ICD-10-CM | POA: Diagnosis not present

## 2020-06-01 DIAGNOSIS — R55 Syncope and collapse: Secondary | ICD-10-CM | POA: Diagnosis not present

## 2020-06-01 DIAGNOSIS — A419 Sepsis, unspecified organism: Secondary | ICD-10-CM | POA: Diagnosis not present

## 2020-06-01 DIAGNOSIS — R7881 Bacteremia: Secondary | ICD-10-CM | POA: Diagnosis not present

## 2020-06-01 DIAGNOSIS — R4020 Unspecified coma: Secondary | ICD-10-CM | POA: Diagnosis not present

## 2020-06-01 DIAGNOSIS — R1311 Dysphagia, oral phase: Secondary | ICD-10-CM | POA: Diagnosis not present

## 2020-06-01 DIAGNOSIS — I6782 Cerebral ischemia: Secondary | ICD-10-CM | POA: Diagnosis not present

## 2020-06-01 DIAGNOSIS — I495 Sick sinus syndrome: Secondary | ICD-10-CM | POA: Diagnosis not present

## 2020-06-01 DIAGNOSIS — Z7401 Bed confinement status: Secondary | ICD-10-CM | POA: Diagnosis not present

## 2020-06-01 DIAGNOSIS — Z951 Presence of aortocoronary bypass graft: Secondary | ICD-10-CM | POA: Diagnosis not present

## 2020-06-01 DIAGNOSIS — I10 Essential (primary) hypertension: Secondary | ICD-10-CM | POA: Diagnosis not present

## 2020-06-01 DIAGNOSIS — J9601 Acute respiratory failure with hypoxia: Secondary | ICD-10-CM | POA: Diagnosis not present

## 2020-06-01 DIAGNOSIS — E871 Hypo-osmolality and hyponatremia: Secondary | ICD-10-CM | POA: Diagnosis not present

## 2020-06-01 DIAGNOSIS — I2510 Atherosclerotic heart disease of native coronary artery without angina pectoris: Secondary | ICD-10-CM | POA: Diagnosis not present

## 2020-06-01 DIAGNOSIS — R001 Bradycardia, unspecified: Secondary | ICD-10-CM | POA: Diagnosis not present

## 2020-06-01 DIAGNOSIS — I4891 Unspecified atrial fibrillation: Secondary | ICD-10-CM | POA: Diagnosis not present

## 2020-06-01 DIAGNOSIS — N179 Acute kidney failure, unspecified: Secondary | ICD-10-CM | POA: Diagnosis not present

## 2020-06-01 DIAGNOSIS — R2681 Unsteadiness on feet: Secondary | ICD-10-CM | POA: Diagnosis not present

## 2020-06-01 DIAGNOSIS — R52 Pain, unspecified: Secondary | ICD-10-CM | POA: Diagnosis not present

## 2020-06-01 DIAGNOSIS — J189 Pneumonia, unspecified organism: Secondary | ICD-10-CM | POA: Diagnosis not present

## 2020-06-01 DIAGNOSIS — M6281 Muscle weakness (generalized): Secondary | ICD-10-CM | POA: Diagnosis not present

## 2020-06-01 DIAGNOSIS — E873 Alkalosis: Secondary | ICD-10-CM | POA: Diagnosis not present

## 2020-06-01 DIAGNOSIS — I25810 Atherosclerosis of coronary artery bypass graft(s) without angina pectoris: Secondary | ICD-10-CM | POA: Diagnosis not present

## 2020-06-01 DIAGNOSIS — R569 Unspecified convulsions: Secondary | ICD-10-CM | POA: Diagnosis not present

## 2020-06-02 DIAGNOSIS — I4891 Unspecified atrial fibrillation: Secondary | ICD-10-CM

## 2020-06-02 DIAGNOSIS — J189 Pneumonia, unspecified organism: Secondary | ICD-10-CM | POA: Diagnosis not present

## 2020-06-02 DIAGNOSIS — R001 Bradycardia, unspecified: Secondary | ICD-10-CM | POA: Diagnosis not present

## 2020-06-02 DIAGNOSIS — I495 Sick sinus syndrome: Secondary | ICD-10-CM | POA: Diagnosis not present

## 2020-06-02 DIAGNOSIS — I361 Nonrheumatic tricuspid (valve) insufficiency: Secondary | ICD-10-CM | POA: Diagnosis not present

## 2020-06-02 DIAGNOSIS — R55 Syncope and collapse: Secondary | ICD-10-CM | POA: Diagnosis not present

## 2020-06-02 DIAGNOSIS — A419 Sepsis, unspecified organism: Secondary | ICD-10-CM | POA: Diagnosis not present

## 2020-06-02 DIAGNOSIS — Z951 Presence of aortocoronary bypass graft: Secondary | ICD-10-CM

## 2020-06-03 DIAGNOSIS — I495 Sick sinus syndrome: Secondary | ICD-10-CM | POA: Diagnosis not present

## 2020-06-03 DIAGNOSIS — Z951 Presence of aortocoronary bypass graft: Secondary | ICD-10-CM | POA: Diagnosis not present

## 2020-06-03 DIAGNOSIS — A419 Sepsis, unspecified organism: Secondary | ICD-10-CM | POA: Diagnosis not present

## 2020-06-03 DIAGNOSIS — J189 Pneumonia, unspecified organism: Secondary | ICD-10-CM | POA: Diagnosis not present

## 2020-06-03 DIAGNOSIS — I4891 Unspecified atrial fibrillation: Secondary | ICD-10-CM | POA: Diagnosis not present

## 2020-06-04 DIAGNOSIS — R7881 Bacteremia: Secondary | ICD-10-CM | POA: Diagnosis not present

## 2020-06-04 DIAGNOSIS — J189 Pneumonia, unspecified organism: Secondary | ICD-10-CM | POA: Diagnosis not present

## 2020-06-04 DIAGNOSIS — Z951 Presence of aortocoronary bypass graft: Secondary | ICD-10-CM | POA: Diagnosis not present

## 2020-06-04 DIAGNOSIS — I4891 Unspecified atrial fibrillation: Secondary | ICD-10-CM | POA: Diagnosis not present

## 2020-06-04 DIAGNOSIS — I495 Sick sinus syndrome: Secondary | ICD-10-CM | POA: Diagnosis not present

## 2020-06-04 DIAGNOSIS — A419 Sepsis, unspecified organism: Secondary | ICD-10-CM | POA: Diagnosis not present

## 2020-06-05 ENCOUNTER — Other Ambulatory Visit: Payer: Self-pay

## 2020-06-05 DIAGNOSIS — I25810 Atherosclerosis of coronary artery bypass graft(s) without angina pectoris: Secondary | ICD-10-CM

## 2020-06-05 DIAGNOSIS — I4891 Unspecified atrial fibrillation: Secondary | ICD-10-CM

## 2020-06-05 DIAGNOSIS — I495 Sick sinus syndrome: Secondary | ICD-10-CM

## 2020-06-05 DIAGNOSIS — J189 Pneumonia, unspecified organism: Secondary | ICD-10-CM | POA: Diagnosis not present

## 2020-06-05 DIAGNOSIS — A419 Sepsis, unspecified organism: Secondary | ICD-10-CM | POA: Diagnosis not present

## 2020-06-06 DIAGNOSIS — I4891 Unspecified atrial fibrillation: Secondary | ICD-10-CM | POA: Diagnosis not present

## 2020-06-06 DIAGNOSIS — J189 Pneumonia, unspecified organism: Secondary | ICD-10-CM | POA: Diagnosis not present

## 2020-06-06 DIAGNOSIS — I495 Sick sinus syndrome: Secondary | ICD-10-CM | POA: Diagnosis not present

## 2020-06-06 DIAGNOSIS — A419 Sepsis, unspecified organism: Secondary | ICD-10-CM | POA: Diagnosis not present

## 2020-06-06 DIAGNOSIS — I25810 Atherosclerosis of coronary artery bypass graft(s) without angina pectoris: Secondary | ICD-10-CM | POA: Diagnosis not present

## 2020-06-07 ENCOUNTER — Ambulatory Visit: Payer: Medicare HMO | Admitting: Cardiology

## 2020-06-07 DIAGNOSIS — M6281 Muscle weakness (generalized): Secondary | ICD-10-CM | POA: Diagnosis not present

## 2020-06-07 DIAGNOSIS — I2510 Atherosclerotic heart disease of native coronary artery without angina pectoris: Secondary | ICD-10-CM | POA: Diagnosis not present

## 2020-06-07 DIAGNOSIS — R1311 Dysphagia, oral phase: Secondary | ICD-10-CM | POA: Diagnosis not present

## 2020-06-07 DIAGNOSIS — R7881 Bacteremia: Secondary | ICD-10-CM | POA: Diagnosis not present

## 2020-06-07 DIAGNOSIS — Z7401 Bed confinement status: Secondary | ICD-10-CM | POA: Diagnosis not present

## 2020-06-07 DIAGNOSIS — A419 Sepsis, unspecified organism: Secondary | ICD-10-CM | POA: Diagnosis not present

## 2020-06-07 DIAGNOSIS — R41841 Cognitive communication deficit: Secondary | ICD-10-CM | POA: Diagnosis not present

## 2020-06-07 DIAGNOSIS — R410 Disorientation, unspecified: Secondary | ICD-10-CM | POA: Diagnosis not present

## 2020-06-07 DIAGNOSIS — M2550 Pain in unspecified joint: Secondary | ICD-10-CM | POA: Diagnosis not present

## 2020-06-07 DIAGNOSIS — R2681 Unsteadiness on feet: Secondary | ICD-10-CM | POA: Diagnosis not present

## 2020-06-07 DIAGNOSIS — Z951 Presence of aortocoronary bypass graft: Secondary | ICD-10-CM | POA: Diagnosis not present

## 2020-06-07 DIAGNOSIS — E871 Hypo-osmolality and hyponatremia: Secondary | ICD-10-CM | POA: Diagnosis not present

## 2020-06-07 DIAGNOSIS — J189 Pneumonia, unspecified organism: Secondary | ICD-10-CM | POA: Diagnosis not present

## 2020-06-07 DIAGNOSIS — I4891 Unspecified atrial fibrillation: Secondary | ICD-10-CM | POA: Diagnosis not present

## 2020-06-07 DIAGNOSIS — I10 Essential (primary) hypertension: Secondary | ICD-10-CM | POA: Diagnosis not present

## 2020-06-07 DIAGNOSIS — I495 Sick sinus syndrome: Secondary | ICD-10-CM | POA: Diagnosis not present

## 2020-06-07 DIAGNOSIS — R531 Weakness: Secondary | ICD-10-CM | POA: Diagnosis not present

## 2020-06-07 DIAGNOSIS — R001 Bradycardia, unspecified: Secondary | ICD-10-CM | POA: Diagnosis not present

## 2020-06-08 ENCOUNTER — Other Ambulatory Visit: Payer: Self-pay

## 2020-06-08 NOTE — Patient Outreach (Signed)
[facility] [facility]) Care Management  06/08/2020  Cameron Willis Feb 07, 1934 [id number]   Referral Date: 06/08/20 Referral Source: [employer] Report Date of Discharge: 06/07/20 Facility:  [facility] system Insurance: [facility]   Referral received.  No outreach warranted at this time.  Patient discharged to [facility].   Plan: RN CM will close case.    Jone Baseman, RN, MSN [facility] Care Management Care Management Coordinator Direct Line [phone] Toll Free: [phone]  Fax: [phone]

## 2020-06-11 DIAGNOSIS — R531 Weakness: Secondary | ICD-10-CM | POA: Diagnosis not present

## 2020-06-11 DIAGNOSIS — I10 Essential (primary) hypertension: Secondary | ICD-10-CM | POA: Diagnosis not present

## 2020-06-11 DIAGNOSIS — J189 Pneumonia, unspecified organism: Secondary | ICD-10-CM | POA: Diagnosis not present

## 2020-06-22 DIAGNOSIS — J189 Pneumonia, unspecified organism: Secondary | ICD-10-CM | POA: Diagnosis not present

## 2020-06-22 DIAGNOSIS — R531 Weakness: Secondary | ICD-10-CM | POA: Diagnosis not present

## 2020-06-22 DIAGNOSIS — I10 Essential (primary) hypertension: Secondary | ICD-10-CM | POA: Diagnosis not present

## 2020-06-25 DIAGNOSIS — I10 Essential (primary) hypertension: Secondary | ICD-10-CM | POA: Diagnosis not present

## 2020-06-25 DIAGNOSIS — J189 Pneumonia, unspecified organism: Secondary | ICD-10-CM | POA: Diagnosis not present

## 2020-06-25 DIAGNOSIS — R531 Weakness: Secondary | ICD-10-CM | POA: Diagnosis not present

## 2020-06-27 ENCOUNTER — Other Ambulatory Visit: Payer: Self-pay

## 2020-06-27 DIAGNOSIS — I4891 Unspecified atrial fibrillation: Secondary | ICD-10-CM | POA: Diagnosis not present

## 2020-06-27 DIAGNOSIS — I4811 Longstanding persistent atrial fibrillation: Secondary | ICD-10-CM | POA: Diagnosis not present

## 2020-06-27 DIAGNOSIS — K219 Gastro-esophageal reflux disease without esophagitis: Secondary | ICD-10-CM | POA: Diagnosis not present

## 2020-06-27 DIAGNOSIS — E871 Hypo-osmolality and hyponatremia: Secondary | ICD-10-CM | POA: Diagnosis not present

## 2020-06-27 DIAGNOSIS — J189 Pneumonia, unspecified organism: Secondary | ICD-10-CM | POA: Diagnosis not present

## 2020-06-27 DIAGNOSIS — Z8701 Personal history of pneumonia (recurrent): Secondary | ICD-10-CM | POA: Diagnosis not present

## 2020-06-27 DIAGNOSIS — Z48812 Encounter for surgical aftercare following surgery on the circulatory system: Secondary | ICD-10-CM | POA: Diagnosis not present

## 2020-06-27 DIAGNOSIS — I10 Essential (primary) hypertension: Secondary | ICD-10-CM | POA: Diagnosis not present

## 2020-06-27 DIAGNOSIS — R001 Bradycardia, unspecified: Secondary | ICD-10-CM | POA: Diagnosis not present

## 2020-06-27 DIAGNOSIS — I2510 Atherosclerotic heart disease of native coronary artery without angina pectoris: Secondary | ICD-10-CM | POA: Diagnosis not present

## 2020-06-27 DIAGNOSIS — Z7901 Long term (current) use of anticoagulants: Secondary | ICD-10-CM | POA: Diagnosis not present

## 2020-06-27 NOTE — Patient Outreach (Signed)
[place] [facility]) Care Management  06/27/2020  CELSO GRANJA [birth year] [id number]   Referral Date: 06/27/20 Referral Source: [employer] Report Date of Discharge: 06/26/20 Facility:  [place]: [facility]   Referral received.  No outreach warranted at this time.  Transition of Care calls being completed via EMMI. RN CM will outreach patient for any red flags received.    Plan: RN CM will close case.    Jone Baseman, RN, MSN [facility] Care Management Care Management Coordinator Direct Line [phone] Toll Free: [phone]  Fax: [phone]

## 2020-07-04 DIAGNOSIS — R001 Bradycardia, unspecified: Secondary | ICD-10-CM | POA: Diagnosis not present

## 2020-07-04 DIAGNOSIS — I25810 Atherosclerosis of coronary artery bypass graft(s) without angina pectoris: Secondary | ICD-10-CM | POA: Diagnosis not present

## 2020-07-04 DIAGNOSIS — E871 Hypo-osmolality and hyponatremia: Secondary | ICD-10-CM | POA: Diagnosis not present

## 2020-07-04 DIAGNOSIS — I2510 Atherosclerotic heart disease of native coronary artery without angina pectoris: Secondary | ICD-10-CM | POA: Diagnosis not present

## 2020-07-04 DIAGNOSIS — Z48812 Encounter for surgical aftercare following surgery on the circulatory system: Secondary | ICD-10-CM | POA: Diagnosis not present

## 2020-07-04 DIAGNOSIS — I10 Essential (primary) hypertension: Secondary | ICD-10-CM | POA: Diagnosis not present

## 2020-07-04 DIAGNOSIS — K219 Gastro-esophageal reflux disease without esophagitis: Secondary | ICD-10-CM | POA: Diagnosis not present

## 2020-07-04 DIAGNOSIS — I4811 Longstanding persistent atrial fibrillation: Secondary | ICD-10-CM | POA: Diagnosis not present

## 2020-07-04 DIAGNOSIS — Z8701 Personal history of pneumonia (recurrent): Secondary | ICD-10-CM | POA: Diagnosis not present

## 2020-07-04 DIAGNOSIS — I4891 Unspecified atrial fibrillation: Secondary | ICD-10-CM | POA: Diagnosis not present

## 2020-07-04 DIAGNOSIS — Z7901 Long term (current) use of anticoagulants: Secondary | ICD-10-CM | POA: Diagnosis not present

## 2020-07-09 ENCOUNTER — Telehealth: Payer: Self-pay | Admitting: Cardiology

## 2020-07-09 NOTE — Telephone Encounter (Signed)
Per request, most recent echo and nuclear faxed to Vicente Males at [facility] [phone])

## 2020-07-11 DIAGNOSIS — Z48812 Encounter for surgical aftercare following surgery on the circulatory system: Secondary | ICD-10-CM | POA: Diagnosis not present

## 2020-07-11 DIAGNOSIS — Z7901 Long term (current) use of anticoagulants: Secondary | ICD-10-CM | POA: Diagnosis not present

## 2020-07-11 DIAGNOSIS — R001 Bradycardia, unspecified: Secondary | ICD-10-CM | POA: Diagnosis not present

## 2020-07-11 DIAGNOSIS — I2510 Atherosclerotic heart disease of native coronary artery without angina pectoris: Secondary | ICD-10-CM | POA: Diagnosis not present

## 2020-07-11 DIAGNOSIS — E871 Hypo-osmolality and hyponatremia: Secondary | ICD-10-CM | POA: Diagnosis not present

## 2020-07-11 DIAGNOSIS — Z8701 Personal history of pneumonia (recurrent): Secondary | ICD-10-CM | POA: Diagnosis not present

## 2020-07-11 DIAGNOSIS — I10 Essential (primary) hypertension: Secondary | ICD-10-CM | POA: Diagnosis not present

## 2020-07-11 DIAGNOSIS — I4811 Longstanding persistent atrial fibrillation: Secondary | ICD-10-CM | POA: Diagnosis not present

## 2020-07-11 DIAGNOSIS — K219 Gastro-esophageal reflux disease without esophagitis: Secondary | ICD-10-CM | POA: Diagnosis not present

## 2020-07-12 DIAGNOSIS — I2510 Atherosclerotic heart disease of native coronary artery without angina pectoris: Secondary | ICD-10-CM | POA: Diagnosis not present

## 2020-07-12 DIAGNOSIS — Z8701 Personal history of pneumonia (recurrent): Secondary | ICD-10-CM | POA: Diagnosis not present

## 2020-07-12 DIAGNOSIS — Z7901 Long term (current) use of anticoagulants: Secondary | ICD-10-CM | POA: Diagnosis not present

## 2020-07-12 DIAGNOSIS — K219 Gastro-esophageal reflux disease without esophagitis: Secondary | ICD-10-CM | POA: Diagnosis not present

## 2020-07-12 DIAGNOSIS — I4811 Longstanding persistent atrial fibrillation: Secondary | ICD-10-CM | POA: Diagnosis not present

## 2020-07-12 DIAGNOSIS — I10 Essential (primary) hypertension: Secondary | ICD-10-CM | POA: Diagnosis not present

## 2020-07-12 DIAGNOSIS — Z48812 Encounter for surgical aftercare following surgery on the circulatory system: Secondary | ICD-10-CM | POA: Diagnosis not present

## 2020-07-12 DIAGNOSIS — E871 Hypo-osmolality and hyponatremia: Secondary | ICD-10-CM | POA: Diagnosis not present

## 2020-07-12 DIAGNOSIS — R001 Bradycardia, unspecified: Secondary | ICD-10-CM | POA: Diagnosis not present

## 2020-07-17 DIAGNOSIS — K219 Gastro-esophageal reflux disease without esophagitis: Secondary | ICD-10-CM | POA: Diagnosis not present

## 2020-07-17 DIAGNOSIS — Z7901 Long term (current) use of anticoagulants: Secondary | ICD-10-CM | POA: Diagnosis not present

## 2020-07-17 DIAGNOSIS — I2510 Atherosclerotic heart disease of native coronary artery without angina pectoris: Secondary | ICD-10-CM | POA: Diagnosis not present

## 2020-07-17 DIAGNOSIS — Z48812 Encounter for surgical aftercare following surgery on the circulatory system: Secondary | ICD-10-CM | POA: Diagnosis not present

## 2020-07-17 DIAGNOSIS — I10 Essential (primary) hypertension: Secondary | ICD-10-CM | POA: Diagnosis not present

## 2020-07-17 DIAGNOSIS — E871 Hypo-osmolality and hyponatremia: Secondary | ICD-10-CM | POA: Diagnosis not present

## 2020-07-17 DIAGNOSIS — Z8701 Personal history of pneumonia (recurrent): Secondary | ICD-10-CM | POA: Diagnosis not present

## 2020-07-17 DIAGNOSIS — R001 Bradycardia, unspecified: Secondary | ICD-10-CM | POA: Diagnosis not present

## 2020-07-17 DIAGNOSIS — I4811 Longstanding persistent atrial fibrillation: Secondary | ICD-10-CM | POA: Diagnosis not present

## 2020-07-19 DIAGNOSIS — I4811 Longstanding persistent atrial fibrillation: Secondary | ICD-10-CM | POA: Diagnosis not present

## 2020-07-19 DIAGNOSIS — Z7901 Long term (current) use of anticoagulants: Secondary | ICD-10-CM | POA: Diagnosis not present

## 2020-07-19 DIAGNOSIS — Z48812 Encounter for surgical aftercare following surgery on the circulatory system: Secondary | ICD-10-CM | POA: Diagnosis not present

## 2020-07-19 DIAGNOSIS — K219 Gastro-esophageal reflux disease without esophagitis: Secondary | ICD-10-CM | POA: Diagnosis not present

## 2020-07-19 DIAGNOSIS — I10 Essential (primary) hypertension: Secondary | ICD-10-CM | POA: Diagnosis not present

## 2020-07-19 DIAGNOSIS — R001 Bradycardia, unspecified: Secondary | ICD-10-CM | POA: Diagnosis not present

## 2020-07-19 DIAGNOSIS — E871 Hypo-osmolality and hyponatremia: Secondary | ICD-10-CM | POA: Diagnosis not present

## 2020-07-19 DIAGNOSIS — Z8701 Personal history of pneumonia (recurrent): Secondary | ICD-10-CM | POA: Diagnosis not present

## 2020-07-19 DIAGNOSIS — I2510 Atherosclerotic heart disease of native coronary artery without angina pectoris: Secondary | ICD-10-CM | POA: Diagnosis not present

## 2020-07-22 DIAGNOSIS — I10 Essential (primary) hypertension: Secondary | ICD-10-CM | POA: Diagnosis not present

## 2020-07-22 DIAGNOSIS — I4811 Longstanding persistent atrial fibrillation: Secondary | ICD-10-CM | POA: Diagnosis not present

## 2020-07-22 DIAGNOSIS — R001 Bradycardia, unspecified: Secondary | ICD-10-CM | POA: Diagnosis not present

## 2020-07-22 DIAGNOSIS — I2510 Atherosclerotic heart disease of native coronary artery without angina pectoris: Secondary | ICD-10-CM | POA: Diagnosis not present

## 2020-07-22 DIAGNOSIS — E871 Hypo-osmolality and hyponatremia: Secondary | ICD-10-CM | POA: Diagnosis not present

## 2020-07-22 DIAGNOSIS — Z8701 Personal history of pneumonia (recurrent): Secondary | ICD-10-CM | POA: Diagnosis not present

## 2020-07-22 DIAGNOSIS — Z48812 Encounter for surgical aftercare following surgery on the circulatory system: Secondary | ICD-10-CM | POA: Diagnosis not present

## 2020-07-22 DIAGNOSIS — K219 Gastro-esophageal reflux disease without esophagitis: Secondary | ICD-10-CM | POA: Diagnosis not present

## 2020-07-22 DIAGNOSIS — Z7901 Long term (current) use of anticoagulants: Secondary | ICD-10-CM | POA: Diagnosis not present

## 2020-07-24 DIAGNOSIS — I4811 Longstanding persistent atrial fibrillation: Secondary | ICD-10-CM | POA: Diagnosis not present

## 2020-07-24 DIAGNOSIS — Z7901 Long term (current) use of anticoagulants: Secondary | ICD-10-CM | POA: Diagnosis not present

## 2020-07-24 DIAGNOSIS — I2510 Atherosclerotic heart disease of native coronary artery without angina pectoris: Secondary | ICD-10-CM | POA: Diagnosis not present

## 2020-07-24 DIAGNOSIS — Z8701 Personal history of pneumonia (recurrent): Secondary | ICD-10-CM | POA: Diagnosis not present

## 2020-07-24 DIAGNOSIS — R001 Bradycardia, unspecified: Secondary | ICD-10-CM | POA: Diagnosis not present

## 2020-07-24 DIAGNOSIS — I10 Essential (primary) hypertension: Secondary | ICD-10-CM | POA: Diagnosis not present

## 2020-07-24 DIAGNOSIS — Z48812 Encounter for surgical aftercare following surgery on the circulatory system: Secondary | ICD-10-CM | POA: Diagnosis not present

## 2020-07-24 DIAGNOSIS — K219 Gastro-esophageal reflux disease without esophagitis: Secondary | ICD-10-CM | POA: Diagnosis not present

## 2020-07-24 DIAGNOSIS — E871 Hypo-osmolality and hyponatremia: Secondary | ICD-10-CM | POA: Diagnosis not present

## 2020-07-26 DIAGNOSIS — K219 Gastro-esophageal reflux disease without esophagitis: Secondary | ICD-10-CM | POA: Diagnosis not present

## 2020-07-26 DIAGNOSIS — I2510 Atherosclerotic heart disease of native coronary artery without angina pectoris: Secondary | ICD-10-CM | POA: Diagnosis not present

## 2020-07-26 DIAGNOSIS — Z48812 Encounter for surgical aftercare following surgery on the circulatory system: Secondary | ICD-10-CM | POA: Diagnosis not present

## 2020-07-26 DIAGNOSIS — Z8701 Personal history of pneumonia (recurrent): Secondary | ICD-10-CM | POA: Diagnosis not present

## 2020-07-26 DIAGNOSIS — I10 Essential (primary) hypertension: Secondary | ICD-10-CM | POA: Diagnosis not present

## 2020-07-26 DIAGNOSIS — R001 Bradycardia, unspecified: Secondary | ICD-10-CM | POA: Diagnosis not present

## 2020-07-26 DIAGNOSIS — E871 Hypo-osmolality and hyponatremia: Secondary | ICD-10-CM | POA: Diagnosis not present

## 2020-07-26 DIAGNOSIS — Z7901 Long term (current) use of anticoagulants: Secondary | ICD-10-CM | POA: Diagnosis not present

## 2020-07-26 DIAGNOSIS — I4811 Longstanding persistent atrial fibrillation: Secondary | ICD-10-CM | POA: Diagnosis not present

## 2020-07-27 DIAGNOSIS — I495 Sick sinus syndrome: Secondary | ICD-10-CM | POA: Diagnosis not present

## 2020-07-27 DIAGNOSIS — I4891 Unspecified atrial fibrillation: Secondary | ICD-10-CM | POA: Diagnosis not present

## 2020-07-27 DIAGNOSIS — Z951 Presence of aortocoronary bypass graft: Secondary | ICD-10-CM | POA: Diagnosis not present

## 2020-07-27 DIAGNOSIS — I25810 Atherosclerosis of coronary artery bypass graft(s) without angina pectoris: Secondary | ICD-10-CM | POA: Diagnosis not present

## 2020-07-27 DIAGNOSIS — K219 Gastro-esophageal reflux disease without esophagitis: Secondary | ICD-10-CM | POA: Diagnosis not present

## 2020-07-27 DIAGNOSIS — R079 Chest pain, unspecified: Secondary | ICD-10-CM | POA: Diagnosis not present

## 2020-07-27 DIAGNOSIS — I10 Essential (primary) hypertension: Secondary | ICD-10-CM | POA: Diagnosis not present

## 2020-07-27 DIAGNOSIS — M25561 Pain in right knee: Secondary | ICD-10-CM | POA: Diagnosis not present

## 2020-07-27 DIAGNOSIS — I480 Paroxysmal atrial fibrillation: Secondary | ICD-10-CM | POA: Diagnosis not present

## 2020-07-27 DIAGNOSIS — J9811 Atelectasis: Secondary | ICD-10-CM | POA: Diagnosis not present

## 2020-07-27 DIAGNOSIS — J90 Pleural effusion, not elsewhere classified: Secondary | ICD-10-CM | POA: Diagnosis not present

## 2020-07-27 DIAGNOSIS — R001 Bradycardia, unspecified: Secondary | ICD-10-CM | POA: Diagnosis not present

## 2020-07-27 DIAGNOSIS — I482 Chronic atrial fibrillation, unspecified: Secondary | ICD-10-CM | POA: Diagnosis not present

## 2020-07-27 DIAGNOSIS — I252 Old myocardial infarction: Secondary | ICD-10-CM | POA: Diagnosis not present

## 2020-07-27 DIAGNOSIS — J449 Chronic obstructive pulmonary disease, unspecified: Secondary | ICD-10-CM | POA: Diagnosis not present

## 2020-07-28 ENCOUNTER — Inpatient Hospital Stay (HOSPITAL_COMMUNITY)
Admission: AD | Admit: 2020-07-28 | Discharge: 2020-07-31 | DRG: 243 | Disposition: A | Discharge status: Home / Self Care | Payer: Medicare HMO | Source: Other Acute Inpatient Hospital | Attending: Cardiology | Admitting: Cardiology

## 2020-07-28 DIAGNOSIS — I482 Chronic atrial fibrillation, unspecified: Secondary | ICD-10-CM | POA: Diagnosis not present

## 2020-07-28 DIAGNOSIS — E440 Moderate protein-calorie malnutrition: Secondary | ICD-10-CM | POA: Diagnosis present

## 2020-07-28 DIAGNOSIS — Z951 Presence of aortocoronary bypass graft: Secondary | ICD-10-CM

## 2020-07-28 DIAGNOSIS — Z6821 Body mass index (BMI) 21.0-21.9, adult: Secondary | ICD-10-CM | POA: Diagnosis not present

## 2020-07-28 DIAGNOSIS — M1990 Unspecified osteoarthritis, unspecified site: Secondary | ICD-10-CM | POA: Diagnosis present

## 2020-07-28 DIAGNOSIS — I252 Old myocardial infarction: Secondary | ICD-10-CM | POA: Diagnosis not present

## 2020-07-28 DIAGNOSIS — J449 Chronic obstructive pulmonary disease, unspecified: Secondary | ICD-10-CM | POA: Diagnosis not present

## 2020-07-28 DIAGNOSIS — R001 Bradycardia, unspecified: Secondary | ICD-10-CM

## 2020-07-28 DIAGNOSIS — Z7901 Long term (current) use of anticoagulants: Secondary | ICD-10-CM

## 2020-07-28 DIAGNOSIS — I495 Sick sinus syndrome: Principal | ICD-10-CM

## 2020-07-28 DIAGNOSIS — R0789 Other chest pain: Secondary | ICD-10-CM | POA: Diagnosis present

## 2020-07-28 DIAGNOSIS — Z79899 Other long term (current) drug therapy: Secondary | ICD-10-CM | POA: Diagnosis not present

## 2020-07-28 DIAGNOSIS — I4821 Permanent atrial fibrillation: Secondary | ICD-10-CM | POA: Diagnosis not present

## 2020-07-28 DIAGNOSIS — Z79891 Long term (current) use of opiate analgesic: Secondary | ICD-10-CM

## 2020-07-28 DIAGNOSIS — I2510 Atherosclerotic heart disease of native coronary artery without angina pectoris: Secondary | ICD-10-CM | POA: Diagnosis not present

## 2020-07-28 DIAGNOSIS — I10 Essential (primary) hypertension: Secondary | ICD-10-CM | POA: Diagnosis present

## 2020-07-28 DIAGNOSIS — E785 Hyperlipidemia, unspecified: Secondary | ICD-10-CM | POA: Diagnosis present

## 2020-07-28 DIAGNOSIS — K219 Gastro-esophageal reflux disease without esophagitis: Secondary | ICD-10-CM | POA: Diagnosis not present

## 2020-07-28 DIAGNOSIS — Z87891 Personal history of nicotine dependence: Secondary | ICD-10-CM | POA: Diagnosis not present

## 2020-07-28 DIAGNOSIS — L8990 Pressure ulcer of unspecified site, unspecified stage: Secondary | ICD-10-CM | POA: Insufficient documentation

## 2020-07-28 DIAGNOSIS — R079 Chest pain, unspecified: Secondary | ICD-10-CM | POA: Diagnosis not present

## 2020-07-28 DIAGNOSIS — Z950 Presence of cardiac pacemaker: Secondary | ICD-10-CM

## 2020-07-28 DIAGNOSIS — Z888 Allergy status to other drugs, medicaments and biological substances status: Secondary | ICD-10-CM

## 2020-07-28 DIAGNOSIS — I4891 Unspecified atrial fibrillation: Secondary | ICD-10-CM

## 2020-07-28 DIAGNOSIS — I25810 Atherosclerosis of coronary artery bypass graft(s) without angina pectoris: Secondary | ICD-10-CM

## 2020-07-28 DIAGNOSIS — M25561 Pain in right knee: Secondary | ICD-10-CM | POA: Diagnosis not present

## 2020-07-28 LAB — TSH: TSH: 1.689 u[IU]/mL (ref 0.350–4.500)

## 2020-07-28 LAB — BRAIN NATRIURETIC PEPTIDE: B Natriuretic Peptide: 282.4 pg/mL — ABNORMAL HIGH (ref 0.0–100.0)

## 2020-07-28 MED ORDER — APIXABAN 5 MG PO TABS
5.0000 mg | ORAL_TABLET | Freq: Two times a day (BID) | ORAL | Status: AC
  Administered 2020-07-28 – 2020-07-29 (×2): 5 mg via ORAL
  Filled 2020-07-28 (×2): qty 1

## 2020-07-28 MED ORDER — AMLODIPINE BESYLATE 5 MG PO TABS
5.0000 mg | ORAL_TABLET | Freq: Every day | ORAL | Status: DC
  Administered 2020-07-29 – 2020-07-31 (×3): 5 mg via ORAL
  Filled 2020-07-28 (×3): qty 1

## 2020-07-28 MED ORDER — MECLIZINE HCL 25 MG PO TABS: 12.5000 mg | ORAL_TABLET | ORAL | Status: DC | PRN

## 2020-07-28 MED ORDER — ONDANSETRON HCL 4 MG/2ML IJ SOLN: 4.0000 mg | Freq: Four times a day (QID) | INTRAMUSCULAR | Status: DC | PRN

## 2020-07-28 MED ORDER — PANTOPRAZOLE SODIUM 40 MG PO TBEC
40.0000 mg | DELAYED_RELEASE_TABLET | Freq: Every day | ORAL | Status: DC
  Administered 2020-07-29 – 2020-07-31 (×3): 40 mg via ORAL
  Filled 2020-07-28 (×3): qty 1

## 2020-07-28 MED ORDER — ACETAMINOPHEN 325 MG PO TABS: 650.0000 mg | ORAL_TABLET | ORAL | Status: DC | PRN

## 2020-07-28 NOTE — H&P (Addendum)
Cardiology Admission History and Physical:   Patient ID: Cameron Willis MRN: [id number]; DOB: [birth year]   Admission date: 07/28/2020  Primary Care Provider: Townsend Roger, MD [facility] Cardiologist: Berniece Salines, DO  [facility] Electrophysiologist:  None   Chief Complaint:  Chest pain, bradycardia  Patient Profile:   Cameron Willis is a 86 y.o. male with PMH of CAD s/p CABG 2013, HTN, HLD, COPD and apparently atrial fibrillation on Eliquis presented to [facility] with chest pain, however noted to have bradycardia concerning for SSS and transferred to [facility] for consideration of possible pacemaker.   History of Present Illness:   [name] is a 86 year old male with past medical history of CAD s/p CABG 2013, HTN, HLD, COPD and apparently atrial fibrillation on Eliquis.  He is not on beta-blocker due to bradycardia.  7-day heart monitor obtained in July 2021 revealed minimal heart rate 28 bpm, maximum heart rate of 148 bpm, average heart rate 48 bpm, 100% atrial fibrillation burden, 27 pulses present with longest lasted 3.7 seconds, 1 minute of ventricular tachycardia lasting 16 beats with a maximal heart rate of 146 bpm.  Due to the presence of nonsustained VT, patient was placed on amiodarone therapy.  Beta-blocker was not initiated due to underlying bradycardia.  Last Myoview obtained on 03/08/2020 showed EF greater than 65%, medium defect of moderate severity present in the basal inferior and basal inferolateral location, overall low risk study with diaphragmatic attenuation, no ischemia or infarct.  Echocardiogram obtained on 03/15/2020 showed EF 60 to 65%, no regional wall motion abnormality, moderate LAE.  Patient was admitted at [facility] in October with a questionable syncope, due to bradycardia, amiodarone was discontinued and switched to amlodipine instead.  Patient was last seen by Dr. Berniece Salines near the end of September 2021 at which time he denied any symptom  associated with bradycardia even though his heart rate was 46 bpm at the time.  Patient presented to [facility] on 07/27/2020 with sudden onset of chest pain.  He was in his usual state state of health prior to that.  He has stopped his acid reflux medication for about 5 days before.  Patient says he was laying down at the time around 5 PM when he started having a left lower breast pain radiating to the right side.  Symptom lasted a total of 1 to 2 minutes.  He sought medical attention [facility].  Troponin was negative.  proBNP was elevated 2460.  Creatinine 1.4.  Hemoglobin normal.  During the hospitalization, it was noted his heart rate frequently dipped down to the 30s to 40s.  He was seen in consultation by Dr. Berniece Salines who felt patient likely has sick sinus syndrome and symptomatic bradycardia and requested transfer to [facility] for consideration of pacemaker therapy.  Talking with the patient, he says his chest discomfort does not feel like the usual acid reflux.  He described as a more of a stinging sensation.  His previous anginal symptom was more of a pressure-like sensation.  Chest pain has not recurred overnight.  Note, patient is a poor historian.    Past Medical History:  Diagnosis Date   Anxiety and depression    Bradycardia by electrocardiogram 01/20/2018   Cervical disc disorder at C4-C5 level with myelopathy 12/05/2016   Chronic atrial fibrillation ([place]) 03/01/2018   Contracture of right knee 12/05/2016   COPD (chronic obstructive pulmonary disease) ([place]) 03/22/2012   Coronary artery disease involving native coronary artery  of native heart without angina pectoris 03/22/2012   Overview:  Coronary artery bypass graft August 2013 LIMA to LAD and SVG to RCA and SVG to obtuse marginal   Dyslipidemia 03/26/2015   Esophageal stenosis    Gait disturbance 10/14/2017   GERD (gastroesophageal reflux disease) 03/22/2012   Hemorrhoids    History of degenerative  disc disease    HTN (hypertension) 03/22/2012   Hypertrophy of prostate    MI, old 03/22/2012   NSVT (nonsustained ventricular tachycardia) ([place]) 04/05/2020   Osteoarthrosis    Paroxysmal atrial fibrillation ([place]) 03/26/2015   Overview:  Chads score 2, was anticoagulated but developed tarry stool optic admission was withdrawn for now scheduled to see gastroenterologist   Persistent atrial fibrillation ([place]) 01/20/2018   Pre-op evaluation 03/25/2016   S/P CABG x 3 03/25/2012   Tobacco abuse 03/22/2012   Overview:  Quit 2 years ago    Past Surgical History:  Procedure Laterality Date   CARDIAC CATHETERIZATION     COLONOSCOPY  05/07/2015   Colonic polyps status post polypectomy. Internal hemorrhoids ( likely etiology of rectal bleeding)    CORONARY ARTERY BYPASS GRAFT     ESOPHAGOGASTRODUODENOSCOPY  11/06/2005   Smal hiatal hernia. Moderate gastritis   HEMORRHOID SURGERY  1996     Medications Prior to Admission: Prior to Admission medications   Medication Sig Start Date End Date Taking? Authorizing Provider  amiodarone (PACERONE) 200 MG tablet Take 200 mg per one tablet by mouth twice daily for three days. Then take 200 mg per one tablet by mouth once daily. 04/05/20   Tobb, Kardie, DO  ELIQUIS 5 MG TABS tablet Take 1 tablet (5 mg total) by mouth 2 (two) times daily. 01/20/18   Park Liter, MD  meclizine (ANTIVERT) 12.5 MG tablet Take 1 tablet by mouth as needed for dizziness. 11/23/17   [provider]  nitrofurantoin, macrocrystal-monohydrate, (MACROBID) 100 MG capsule 100 mg. 04/16/20   [provider]  nitroGLYCERIN (NITROSTAT) 0.4 MG SL tablet Place 1 tablet (0.4 mg total) under the tongue as needed for chest pain. 02/23/20   Tobb, Kardie, DO  pantoprazole (PROTONIX) 40 MG tablet Take 40 mg by mouth daily. 03/08/20   [provider]  pravastatin (PRAVACHOL) 80 MG tablet Take 80 mg by mouth at bedtime.    [provider]  traMADol  (ULTRAM) 50 MG tablet Take 50 mg by mouth at bedtime as needed.    [provider]     Allergies:    Allergies  Allergen Reactions   Aciphex [Rabeprazole Sodium] Hives   Lipitor [Atorvastatin]    Omeprazole Hives   Pravachol [Pravastatin]     Social History:   Social History   Socioeconomic History   Marital status: Married    Spouse name: Not on file   Number of children: Not on file   Years of education: Not on file   Highest education level: Not on file  Occupational History   Not on file  Tobacco Use   Smoking status: Former Smoker   Smokeless tobacco: Never Used  Scientific laboratory technician Use: Never used  Substance and Sexual Activity   Alcohol use: No   Drug use: No   Sexual activity: Not on file  Other Topics Concern   Not on file  Social History Narrative   Not on file   Social Determinants of Health   Financial Resource Strain: Not on file  Food Insecurity: Not on file  Transportation Needs: Not  on file  Physical Activity: Not on file  Stress: Not on file  Social Connections: Not on file  Intimate Partner Violence: Not on file    Family History:   The patient's family history includes Cancer in his father; Esophageal cancer in his father. There is no history of Colon cancer.    ROS:  Please see the history of present illness.  All other ROS reviewed and negative.     Physical Exam/Data:   Vitals:   07/28/20 1409  BP: (!) 159/70  Resp: 18   No intake or output data in the 24 hours ending 07/28/20 1446 Last 3 Weights 05/03/2020 04/05/2020 03/08/2020  Weight (lbs) 170 lb 179 lb 169 lb  Weight (kg) 77.111 kg 81.194 kg 76.658 kg     There is no height or weight on file to calculate BMI.  General:  Well nourished, well developed, in no acute distress HEENT: normal Lymph: no adenopathy Neck: no JVD Endocrine:  No thryomegaly Vascular: No carotid bruits; FA pulses 2+ bilaterally without bruits  Cardiac:  normal S1, S2; RRR; no  murmur  Lungs:  clear to auscultation bilaterally, no wheezing, rhonchi or rales  Abd: soft, nontender, no hepatomegaly  Ext: no edema Musculoskeletal:  No deformities, BUE and BLE strength normal and equal Skin: warm and dry  Neuro:  CNs 2-12 intact, no focal abnormalities noted Psych:  Normal affect    EKG:  The ECG pending   Relevant CV Studies:  Myoview 03/08/2020  Nuclear stress EF: 71%.  The left ventricular ejection fraction is hyperdynamic (>65%).  There was no ST segment deviation noted during stress.  Defect 1: There is a medium defect of moderate severity present in the basal inferior and basal inferolateral location.  This is a low risk study.  No ischemia or MI.  Diaphragmatic attenuation.  Hyperdynamic LV.     Echo 03/15/2020 1. Left ventricular ejection fraction, by estimation, is 60 to 65%. The  left ventricle has normal function. The left ventricle has no regional  wall motion abnormalities. Left ventricular diastolic parameters are  indeterminate.  2. Left atrial size was moderately dilated.  3. Rhythm: atrial fibrillation.   Laboratory Data:  High Sensitivity Troponin:  No results for input(s): TROPONINIHS in the last 720 hours.    ChemistryNo results for input(s): NA, K, CL, CO2, GLUCOSE, BUN, CREATININE, CALCIUM, GFRNONAA, GFRAA, ANIONGAP in the last 168 hours.  No results for input(s): PROT, ALBUMIN, AST, ALT, ALKPHOS, BILITOT in the last 168 hours. HematologyNo results for input(s): WBC, RBC, HGB, HCT, MCV, MCH, MCHC, RDW, PLT in the last 168 hours. BNPNo results for input(s): BNP, PROBNP in the last 168 hours.  DDimer No results for input(s): DDIMER in the last 168 hours.   Radiology/Studies:  No results found.   Assessment and Plan:   1. Atrial fibrillation with slow ventricular rate: [facility], patient reportedly had heart rate in the 30s and 40s.  After arriving at [facility], heart rate remained  bradycardic in the mid to high 40s.   -Patient was seen by Dr. Servando Salinaobb in consultation at [facility], at which time he was concerned that patient might have sick sinus syndrome with symptomatic bradycardia and tranferred to [facility] for consideration of pacemaker.  However after arrival, patient adamantly denies any recent dizziness, blurred vision or feeling of passing out.  Further assessment by EP service tomorrow.  2. Chest pain: Solitary episode that lasted 1 to 2 minutes.  Symptom is  different from the previous angina or acid reflux symptoms.  It was noted the patient was off of acid reflux medication for about 5 days.  He just took a dose yesterday after the start of the chest pain.  Previous echocardiogram and Myoview obtained in August 2021 were normal.  Given atypical nature of the chest pain, will continue to monitor.  Suspicion for ACS very low.  3. CAD s/p CABG 2013: Solitary episode of chest pain yesterday, feels different from the previous anginal symptom.  Reassuring Myoview in August 2021   4. Hypertension: Blood pressure borderline elevated.  He is on 5 mg daily of amlodipine at home.  Amlodipine was discontinued at [facility] due to concern of small possibility of inducing bradycardia.  5. Hyperlipidemia: Not on statin at home.  6. COPD  7. NSVT: Previous 7-day heart monitor obtained in July 2021 noted 17 beats run of nonsustained VT, he was placed on amiodarone therapy, this was discontinued in October 2021 due to presence of bradycardia.  Even prior to the initiation of amiodarone therapy, his heart rate was already in the high 40s to low 50s.  8. Elevated proBNP: obtain BNP. Normal Echo in Aug, will obtain limited echo.       HEAR Score (for undifferentiated chest pain):          CHA2DS2-VASc Score = 4  This indicates a 4.8% annual risk of stroke. The patient's score is based upon: CHF History: No HTN History: Yes Diabetes History: No Stroke History:  No Vascular Disease History: Yes Age Score: 2 Gender Score: 0    Severity of Illness: The appropriate patient status for this patient is INPATIENT. Inpatient status is judged to be reasonable and necessary in order to provide the required intensity of service to ensure the patient's safety. The patient's presenting symptoms, physical exam findings, and initial radiographic and laboratory data in the context of their chronic comorbidities is felt to place them at high risk for further clinical deterioration. Furthermore, it is not anticipated that the patient will be medically stable for discharge from the hospital within 2 midnights of admission. The following factors support the patient status of inpatient.   " The patient's presenting symptoms include chest pain. " The worrisome physical exam findings include benign. " The initial radiographic and laboratory data are worrisome because of bradycardia on telemetry. " The chronic co-morbidities include permanent atrial fibrillation .   * I certify that at the point of admission it is my clinical judgment that the patient will require inpatient hospital care spanning beyond 2 midnights from the point of admission due to high intensity of service, high risk for further deterioration and high frequency of surveillance required.*    For questions or updates, please contact [place] Please consult [email or web address] for contact info under     Hilbert Corrigan, [place]  07/28/2020 2:46 PM

## 2020-07-28 NOTE — Progress Notes (Signed)
ANTICOAGULATION CONSULT NOTE - Initial Consult  Pharmacy Consult for apixaban Indication: atrial fibrillation  Allergies  Allergen Reactions  . Aciphex [Rabeprazole Sodium] Hives  . Lipitor [Atorvastatin]   . Omeprazole Hives  . Pravachol [Pravastatin]     Patient Measurements: Height: 5\' 11"  (180.3 cm) Weight: 70.2 kg (154 lb 12.2 oz) IBW/kg (Calculated) : 75.3   Vital Signs: BP: 159/70 (12/25 1409)  Labs: No results for input(s): HGB, HCT, PLT, APTT, LABPROT, INR, HEPARINUNFRC, HEPRLOWMOCWT, CREATININE, CKTOTAL, CKMB, TROPONINIHS in the last 72 hours.  CrCl cannot be calculated (Patient's most recent lab result is older than the maximum 21 days allowed.).   Medical History: Past Medical History:  Diagnosis Date  . Anxiety and depression   . Bradycardia by electrocardiogram 01/20/2018  . Cervical disc disorder at C4-C5 level with myelopathy 12/05/2016  . Chronic atrial fibrillation ([place]) 03/01/2018  . Contracture of right knee 12/05/2016  . COPD (chronic obstructive pulmonary disease) ([place]) 03/22/2012  . Coronary artery disease involving native coronary artery of native heart without angina pectoris 03/22/2012   Overview:  Coronary artery bypass graft August 2013 LIMA to LAD and SVG to RCA and SVG to obtuse marginal  . Dyslipidemia 03/26/2015  . Esophageal stenosis   . Gait disturbance 10/14/2017  . GERD (gastroesophageal reflux disease) 03/22/2012  . Hemorrhoids   . History of degenerative disc disease   . HTN (hypertension) 03/22/2012  . Hypertrophy of prostate   . MI, old 03/22/2012  . NSVT (nonsustained ventricular tachycardia) ([place]) 04/05/2020  . Osteoarthrosis   . Paroxysmal atrial fibrillation ([place]) 03/26/2015   Overview:  Chads score 2, was anticoagulated but developed tarry stool optic admission was withdrawn for now scheduled to see gastroenterologist  . Persistent atrial fibrillation ([place]) 01/20/2018  . Pre-op evaluation 03/25/2016  . S/P CABG x 3 03/25/2012  . Tobacco  abuse 03/22/2012   Overview:  Quit 2 years ago     Assessment: Cameron Willis with afib admitted with bradycardia and planned pacemaker placement 12/17 Will continue PTA apixaban 5mg  BID. Dose ok currently age >54, Wt > 60kg and last Cr 1.4 in 9/21.  Will follow up Bmet in am if Cr remains >1.5 may consider decrease dose in future.    Goal of Therapy:  Monitor platelets by anticoagulation protocol: Yes   Plan:  apixaban 5mg  BID give tonight and tomorrow morning  Hold apixaban 12/26 pm and 12/27 am Follow up post pacer placement for restart apixaban   Bonnita Nasuti Pharm.D. CPP, BCPS Clinical Pharmacist [phone] 07/28/2020 4:26 PM

## 2020-07-28 NOTE — Plan of Care (Signed)
  Problem: Education: Goal: Knowledge of General Education information will improve Description: Including pain rating scale, medication(s)/side effects and non-pharmacologic comfort measures Outcome: Progressing   Problem: Nutrition: Goal: Adequate nutrition will be maintained Outcome: Progressing   Problem: Coping: Goal: Level of anxiety will decrease Outcome: Progressing   

## 2020-07-28 NOTE — Plan of Care (Signed)

## 2020-07-29 ENCOUNTER — Other Ambulatory Visit (HOSPITAL_COMMUNITY): Payer: Medicare HMO

## 2020-07-29 ENCOUNTER — Encounter (HOSPITAL_COMMUNITY): Payer: Self-pay | Admitting: Cardiovascular Disease

## 2020-07-29 ENCOUNTER — Inpatient Hospital Stay (HOSPITAL_COMMUNITY): Payer: Medicare HMO

## 2020-07-29 ENCOUNTER — Other Ambulatory Visit: Payer: Self-pay

## 2020-07-29 DIAGNOSIS — I10 Essential (primary) hypertension: Secondary | ICD-10-CM

## 2020-07-29 DIAGNOSIS — I4891 Unspecified atrial fibrillation: Secondary | ICD-10-CM

## 2020-07-29 DIAGNOSIS — I2510 Atherosclerotic heart disease of native coronary artery without angina pectoris: Secondary | ICD-10-CM | POA: Diagnosis not present

## 2020-07-29 DIAGNOSIS — L8990 Pressure ulcer of unspecified site, unspecified stage: Secondary | ICD-10-CM | POA: Insufficient documentation

## 2020-07-29 LAB — BASIC METABOLIC PANEL
Anion gap: 10 (ref 5–15)
BUN: 19 mg/dL (ref 8–23)
CO2: 23 mmol/L (ref 22–32)
Calcium: 9.3 mg/dL (ref 8.9–10.3)
Chloride: 105 mmol/L (ref 98–111)
Creatinine, Ser: 1.41 mg/dL — ABNORMAL HIGH (ref 0.61–1.24)
GFR, Estimated: 49 mL/min — ABNORMAL LOW (ref >60)
Glucose, Bld: 85 mg/dL (ref 70–99)
Potassium: 4.1 mmol/L (ref 3.5–5.1)
Sodium: 138 mmol/L (ref 135–145)

## 2020-07-29 LAB — CBC
HCT: 42.3 % (ref 39.0–52.0)
Hemoglobin: 13.7 g/dL (ref 13.0–17.0)
MCH: 32.3 pg (ref 26.0–34.0)
MCHC: 32.4 g/dL (ref 30.0–36.0)
MCV: 99.8 fL (ref 80.0–100.0)
Platelets: 191 10*3/uL (ref 150–400)
RBC: 4.24 MIL/uL (ref 4.22–5.81)
RDW: 13.3 % (ref 11.5–15.5)
WBC: 6.5 10*3/uL (ref 4.0–10.5)
nRBC: 0 % (ref 0.0–0.2)

## 2020-07-29 LAB — ECHOCARDIOGRAM LIMITED
Height: 71 in
S' Lateral: 2.7 cm
Single Plane A4C EF: 62.3 %
Weight: 2416.24 oz

## 2020-07-29 NOTE — Plan of Care (Signed)
°  Problem: Clinical Measurements: °Goal: Ability to maintain clinical measurements within normal limits will improve °Outcome: Progressing °Goal: Cardiovascular complication will be avoided °Outcome: Progressing °  °Problem: Activity: °Goal: Risk for activity intolerance will decrease °Outcome: Progressing °  °Problem: Nutrition: °Goal: Adequate nutrition will be maintained °Outcome: Progressing °  °

## 2020-07-29 NOTE — Progress Notes (Signed)
  Echocardiogram 2D Echocardiogram has been performed.  Merrie Roof F 07/29/2020, 3:07 PM

## 2020-07-29 NOTE — Progress Notes (Signed)
Progress Note  Patient Name: Cameron Willis Date of Encounter: 07/29/2020  [facility] Cardiologist: Berniece Salines, DO   Subjective   Denies chest pain or shortness of breath this morning.  Inpatient Medications    Scheduled Meds: . amLODipine  5 mg Oral Daily  . apixaban  5 mg Oral BID  . pantoprazole  40 mg Oral Daily   Continuous Infusions:  PRN Meds: acetaminophen, meclizine, ondansetron (ZOFRAN) IV   Vital Signs    Vitals:   07/28/20 1409 07/28/20 1851 07/28/20 2021 07/29/20 0520  BP: (!) 159/70  131/67 133/71  Pulse:   (!) 51 (!) 44  Resp: 18  (!) 22 16  Temp:  98 F (36.7 C) 97.9 F (36.6 C) 98.9 F (37.2 C)  TempSrc:  Oral Oral Oral  SpO2:   96% 98%  Weight: 70.2 kg   68.5 kg  Height: 5\' 11"  (1.803 m)       Intake/Output Summary (Last 24 hours) at 07/29/2020 0810 Last data filed at 07/29/2020 0708 Gross per 24 hour  Intake --  Output 100 ml  Net -100 ml   Last 3 Weights 07/29/2020 07/28/2020 05/03/2020  Weight (lbs) 151 lb 0.2 oz 154 lb 12.2 oz 170 lb  Weight (kg) 68.5 kg 70.2 kg 77.111 kg      Telemetry    Atrial fibrillation with a slow ventricular response- Personally Reviewed  ECG    Not performed today- Personally Reviewed  Physical Exam   GEN: No acute distress.   Neck: No JVD Cardiac:  Irregularly irregular, no murmurs, rubs, or gallops.  Respiratory: Clear to auscultation bilaterally. GI: Soft, nontender, non-distended  MS: No edema; No deformity. Neuro:  Nonfocal  Psych: Normal affect   Labs    High Sensitivity Troponin:  No results for input(s): TROPONINIHS in the last 720 hours.    Chemistry Recent Labs  Lab 07/29/20 0441  NA 138  K 4.1  CL 105  CO2 23  GLUCOSE 85  BUN 19  CREATININE 1.41*  CALCIUM 9.3  GFRNONAA 49*  ANIONGAP 10     HematologyNo results for input(s): WBC, RBC, HGB, HCT, MCV, MCH, MCHC, RDW, PLT in the last 168 hours.  BNP Recent Labs  Lab 07/28/20 1527  BNP 282.4*     DDimer No  results for input(s): DDIMER in the last 168 hours.   Radiology    No results found.  Cardiac Studies   None  Patient Profile      TATSUYA OKRAY is a 86 y.o. male with PMH of CAD s/p CABG 2013, HTN, HLD, COPD and apparently atrial fibrillation on Eliquis presented to [facility] with chest pain, however noted to have bradycardia concerning for SSS and transferred to [facility] for consideration of possible pacemaker.   Assessment & Plan    1: Atrial fibrillation-history of persistent A. fib on Eliquis oral anticoagulation.  He has had rates in the 30s and 40s as well as elevated rates consistent with "tachybradycardia syndrome".  He is not on any negative chronotropic at the current time.  He was on amiodarone briefly for nonsustained ventricular tachycardia seen on event monitoring.  He does complain of some dizziness.  He was transferred from [facility] by Dr. Harriet Masson, his primary cardiologist, for permanent transvenous pacemaker insertion.  His Eliquis will be held this evening.  2: Chest pain-he apparently was off his antireflux medications for 5 days.  He denies recurrent chest pain.  His enzymes were low.  He  had a negative Myoview stress test August 2021 and normal 2D echo.  3: CAD-history of CABG in 2013 with a negative Myoview August 2021.  4: Essential hypertension-on amlodipine at home and here in the hospital with good blood pressure control  5: Hyperlipidemia-not on statin at home  6: Elevated pro NP-echo in the in the recent past has been normal.  A limited 2D echo was ordered.  For questions or updates, please contact [facility] Please consult [email or web address] for contact info under        Signed, Nanetta Batty, MD  07/29/2020, 8:10 AM

## 2020-07-29 NOTE — Progress Notes (Signed)
Pt.had a SVR pause of 3.01secs.Pt.was asymptomatic.MD on call TURNER made aware .Will continue to monitor pt.

## 2020-07-30 ENCOUNTER — Encounter (HOSPITAL_COMMUNITY)
Admission: AD | Disposition: A | Discharge status: Home / Self Care | Payer: Self-pay | Source: Other Acute Inpatient Hospital | Attending: Cardiovascular Disease

## 2020-07-30 DIAGNOSIS — I495 Sick sinus syndrome: Principal | ICD-10-CM

## 2020-07-30 DIAGNOSIS — I4821 Permanent atrial fibrillation: Secondary | ICD-10-CM

## 2020-07-30 HISTORY — PX: PACEMAKER IMPLANT: EP1218

## 2020-07-30 LAB — SURGICAL PCR SCREEN
MRSA, PCR: NEGATIVE
Staphylococcus aureus: POSITIVE — AB

## 2020-07-30 SURGERY — PACEMAKER IMPLANT

## 2020-07-30 MED ORDER — HEPARIN (PORCINE) IN NACL 1000-0.9 UT/500ML-% IV SOLN
INTRAVENOUS | Status: DC | PRN
  Administered 2020-07-30: 500 mL

## 2020-07-30 MED ORDER — FENTANYL CITRATE (PF) 100 MCG/2ML IJ SOLN
INTRAMUSCULAR | Status: DC | PRN
  Administered 2020-07-30: 25 ug via INTRAVENOUS

## 2020-07-30 MED ORDER — CEFAZOLIN SODIUM-DEXTROSE 2-4 GM/100ML-% IV SOLN
INTRAVENOUS | Status: AC
  Filled 2020-07-30: qty 100

## 2020-07-30 MED ORDER — ADULT MULTIVITAMIN W/MINERALS CH
1.0000 | ORAL_TABLET | Freq: Every day | ORAL | Status: DC
  Administered 2020-07-30 – 2020-07-31 (×2): 1 via ORAL
  Filled 2020-07-30 (×2): qty 1

## 2020-07-30 MED ORDER — LIDOCAINE HCL (PF) 1 % IJ SOLN
INTRAMUSCULAR | Status: DC | PRN
  Administered 2020-07-30: 60 mL

## 2020-07-30 MED ORDER — CHLORHEXIDINE GLUCONATE 4 % EX LIQD
60.0000 mL | Freq: Once | CUTANEOUS | Status: DC
  Administered 2020-07-30: 4 via TOPICAL
  Filled 2020-07-30: qty 60

## 2020-07-30 MED ORDER — ENSURE ENLIVE PO LIQD
237.0000 mL | Freq: Three times a day (TID) | ORAL | Status: DC
  Administered 2020-07-31: 10:00:00 237 mL via ORAL

## 2020-07-30 MED ORDER — HEPARIN (PORCINE) IN NACL 1000-0.9 UT/500ML-% IV SOLN
INTRAVENOUS | Status: AC
  Filled 2020-07-30: qty 500

## 2020-07-30 MED ORDER — MUPIROCIN 2 % EX OINT
1.0000 "application " | TOPICAL_OINTMENT | Freq: Two times a day (BID) | CUTANEOUS | Status: DC
  Administered 2020-07-30 – 2020-07-31 (×2): 1 via NASAL
  Filled 2020-07-30 (×2): qty 22

## 2020-07-30 MED ORDER — ACETAMINOPHEN 325 MG PO TABS: 650.0000 mg | ORAL_TABLET | ORAL | Status: DC | PRN

## 2020-07-30 MED ORDER — SODIUM CHLORIDE 0.9 % IV SOLN
INTRAVENOUS | Status: AC
  Filled 2020-07-30: qty 2

## 2020-07-30 MED ORDER — CHLORHEXIDINE GLUCONATE CLOTH 2 % EX PADS
6.0000 | MEDICATED_PAD | Freq: Every day | CUTANEOUS | Status: DC
  Administered 2020-07-31: 05:00:00 6 via TOPICAL

## 2020-07-30 MED ORDER — ONDANSETRON HCL 4 MG/2ML IJ SOLN: 4.0000 mg | Freq: Four times a day (QID) | INTRAMUSCULAR | Status: DC | PRN

## 2020-07-30 MED ORDER — LIDOCAINE HCL (PF) 1 % IJ SOLN
INTRAMUSCULAR | Status: AC
  Filled 2020-07-30: qty 60

## 2020-07-30 MED ORDER — ACETAMINOPHEN 325 MG PO TABS: 325.0000 mg | ORAL_TABLET | ORAL | Status: DC | PRN

## 2020-07-30 MED ORDER — SODIUM CHLORIDE 0.9 % IV SOLN: INTRAVENOUS | Status: DC

## 2020-07-30 MED ORDER — MIDAZOLAM HCL 5 MG/5ML IJ SOLN
INTRAMUSCULAR | Status: DC | PRN
  Administered 2020-07-30: 2 mg via INTRAVENOUS

## 2020-07-30 MED ORDER — CEFAZOLIN SODIUM-DEXTROSE 2-4 GM/100ML-% IV SOLN
2.0000 g | INTRAVENOUS | Status: AC
  Administered 2020-07-30: 16:00:00 2 g via INTRAVENOUS

## 2020-07-30 MED ORDER — SODIUM CHLORIDE 0.9 % IV SOLN
80.0000 mg | INTRAVENOUS | Status: AC
  Administered 2020-07-30: 17:00:00 80 mg
  Filled 2020-07-30: qty 2

## 2020-07-30 MED ORDER — FENTANYL CITRATE (PF) 100 MCG/2ML IJ SOLN
INTRAMUSCULAR | Status: AC
  Filled 2020-07-30: qty 2

## 2020-07-30 MED ORDER — CEFAZOLIN SODIUM-DEXTROSE 1-4 GM/50ML-% IV SOLN
1.0000 g | Freq: Four times a day (QID) | INTRAVENOUS | Status: AC
  Administered 2020-07-30 – 2020-07-31 (×3): 1 g via INTRAVENOUS
  Filled 2020-07-30 (×3): qty 50

## 2020-07-30 MED ORDER — MIDAZOLAM HCL 5 MG/5ML IJ SOLN
INTRAMUSCULAR | Status: AC
  Filled 2020-07-30: qty 5

## 2020-07-30 SURGICAL SUPPLY — 10 items
CABLE SURGICAL S-101-97-12 (CABLE) ×3 IMPLANT
CATH RIGHTSITE C315HIS02 (CATHETERS) ×3 IMPLANT
IPG PACE AZUR XT SR MRI W1SR01 (Pacemaker) ×1 IMPLANT
LEAD SELECT SECURE 3830 383069 (Lead) ×1 IMPLANT
PACE AZURE XT SR MRI W1SR01 (Pacemaker) ×3 IMPLANT
PAD PRO RADIOLUCENT 2001M-C (PAD) ×3 IMPLANT
SELECT SECURE 3830 383069 (Lead) ×3 IMPLANT
SHEATH 7FR PRELUDE SNAP 13 (SHEATH) ×3 IMPLANT
TRAY PACEMAKER INSERTION (PACKS) ×3 IMPLANT
WIRE HI TORQ VERSACORE-J 145CM (WIRE) ×3 IMPLANT

## 2020-07-30 NOTE — Progress Notes (Signed)
Progress Note  Patient Name: Cameron Willis Date of Encounter: 07/30/2020  [facility] Cardiologist: Godfrey Pick Tobb, DO   Subjective   No acute events overnight. Planned for pacemaker implantation today. All of his questions have been answered. No chest pain since 12/24 evening.  Inpatient Medications    Scheduled Meds: . amLODipine  5 mg Oral Daily  . chlorhexidine  60 mL Topical Once  . gentamicin irrigation  80 mg Irrigation On Call  . pantoprazole  40 mg Oral Daily   Continuous Infusions: . sodium chloride 50 mL/hr at 07/30/20 0821  . sodium chloride    .  ceFAZolin (ANCEF) IV     PRN Meds: acetaminophen, meclizine, ondansetron (ZOFRAN) IV   Vital Signs    Vitals:   07/29/20 1301 07/29/20 2000 07/30/20 0555 07/30/20 0820  BP: (!) 147/72 130/63 123/83 132/85  Pulse: (!) 56 (!) 50 (!) 42   Resp: 18 17 18    Temp: 97.6 F (36.4 C) 97.8 F (36.6 C) 98.3 F (36.8 C)   TempSrc: Axillary Oral Oral   SpO2: 96% 95% 100%   Weight:   70.5 kg   Height:        Intake/Output Summary (Last 24 hours) at 07/30/2020 0837 Last data filed at 07/30/2020 0557 Gross per 24 hour  Intake 600 ml  Output 800 ml  Net -200 ml   Last 3 Weights 07/30/2020 07/29/2020 07/28/2020  Weight (lbs) 155 lb 6.8 oz 151 lb 0.2 oz 154 lb 12.2 oz  Weight (kg) 70.5 kg 68.5 kg 70.2 kg      Telemetry    Atrial fibrillation, with slow ventricular response - Personally Reviewed  ECG    12/26 coarse afib vs atypical flutter with ventricular rate 48 bpm  - Personally Reviewed  Physical Exam   GEN: No acute distress.   Neck: No JVD Cardiac: bradycardic irregular rhythm, no murmurs, rubs, or gallops.  Respiratory: Clear to auscultation bilaterally. GI: Soft, nontender, non-distended  MS: No edema; No deformity. Neuro:  Nonfocal  Psych: Normal affect   Labs    High Sensitivity Troponin:  No results for input(s): TROPONINIHS in the last 720 hours.    Chemistry Recent Labs  Lab  07/29/20 0441  NA 138  K 4.1  CL 105  CO2 23  GLUCOSE 85  BUN 19  CREATININE 1.41*  CALCIUM 9.3  GFRNONAA 49*  ANIONGAP 10     Hematology Recent Labs  Lab 07/29/20 0823  WBC 6.5  RBC 4.24  HGB 13.7  HCT 42.3  MCV 99.8  MCH 32.3  MCHC 32.4  RDW 13.3  PLT 191    BNP Recent Labs  Lab 07/28/20 1527  BNP 282.4*     DDimer No results for input(s): DDIMER in the last 168 hours.   Radiology    ECHOCARDIOGRAM LIMITED  Result Date: 07/29/2020    ECHOCARDIOGRAM LIMITED REPORT   Patient Name:   Cameron Willis Date of Exam: 07/29/2020 Medical Rec #:  [id number]     Height:       71.0 in Accession #:    [id number]    Weight:       151.0 lb Date of Birth:  10/15/1933     BSA:          1.871 m Patient Age:    86 years      BP:           147/72 mmHg Patient Gender: M  HR:           40 bpm. Exam Location:  Inpatient Procedure: Limited Echo Indications:    Elevated BNP. Check EF  History:        Patient has prior history of Echocardiogram examinations, most                 recent 03/15/2020. Prior CABG.  Sonographer:    Merrie Roof RDCS Referring Phys: [id number] HAO MENG  Sonographer Comments: This was a limited echo to check EF IMPRESSIONS  1. This was a limited study for evaluation of LVEF that is normal estimated at 60-65% and unchanged from prior study on 03/15/2020.  2. Left ventricular ejection fraction, by estimation, is 60 to 65%. The left ventricle has normal function. The left ventricle has no regional wall motion abnormalities. Left ventricular diastolic function could not be evaluated.  3. Right ventricular systolic function is normal. The right ventricular size is normal.  4. Left atrial size was mildly dilated.  5. The mitral valve is grossly normal.  6. The aortic valve is normal in structure. FINDINGS  Left Ventricle: Left ventricular ejection fraction, by estimation, is 60 to 65%. The left ventricle has normal function. The left ventricle has no regional wall motion  abnormalities. The left ventricular internal cavity size was normal in size. There is  no left ventricular hypertrophy. Left ventricular diastolic function could not be evaluated. Right Ventricle: The right ventricular size is normal. No increase in right ventricular wall thickness. Right ventricular systolic function is normal. Left Atrium: Left atrial size was mildly dilated. Right Atrium: Right atrial size was normal in size. Pericardium: There is no evidence of pericardial effusion. Mitral Valve: The mitral valve is grossly normal. Tricuspid Valve: The tricuspid valve is not assessed. No evidence of tricuspid stenosis. Aortic Valve: The aortic valve is normal in structure. Pulmonic Valve: The pulmonic valve was normal in structure. Pulmonic valve regurgitation is not visualized. No evidence of pulmonic stenosis. IAS/Shunts: No atrial level shunt detected by color flow Doppler. LEFT VENTRICLE PLAX 2D LVIDd:         3.70 cm LVIDs:         2.70 cm LV PW:         1.00 cm LV IVS:        0.90 cm  LV Volumes (MOD) LV vol d, MOD A4C: 56.8 ml LV vol s, MOD A4C: 21.4 ml LV SV MOD A4C:     56.8 ml LEFT ATRIUM         Index LA diam:    4.50 cm 2.40 cm/m   AORTA Ao Root diam: 3.50 cm Ena Dawley MD Electronically signed by Ena Dawley MD Signature Date/Time: 07/29/2020/3:33:36 PM    Final     Cardiac Studies   Echo limited 07/29/20 1. This was a limited study for evaluation of LVEF that is normal  estimated at 60-65% and unchanged from prior study on 03/15/2020.  2. Left ventricular ejection fraction, by estimation, is 60 to 65%. The  left ventricle has normal function. The left ventricle has no regional  wall motion abnormalities. Left ventricular diastolic function could not  be evaluated.  3. Right ventricular systolic function is normal. The right ventricular  size is normal.  4. Left atrial size was mildly dilated.  5. The mitral valve is grossly normal.  6. The aortic valve is normal in  structure.   Patient Profile     86 y.o. male with PMH of CAD  s/p CABG 2013, HTN, HLD, COPD and apparently atrial fibrillation on Eliquis presented to [facility] with chest pain, however noted to have bradycardia concerning for SSS and transferred to [facility] for consideration of possible pacemaker.   Assessment & Plan    Atrial fibrillation, with slow ventricular response -transferred from [facility] given concern for presyncope/symptomatic bradycardia -on no AV nodal agents currently, amiodarone held on admission -apixaban held 12/26 PM dose -EP seen, plan for pacemaker today -CHA2DS2/VAS Stroke Risk Points= 4, resume apixaban when cleared post pacemaker  CAD, s/p CABG: atypical chest pain reported, now resolved -negative troponins at [facility] -recent stress test unremarkable -no aspirin as he is on apixaban -not currently on statin. Reported intolerance to atorvastatin and pravastatin. Defer to Dr. Harriet Masson as primary cardiologist, follow up as outpatient  Hypertension: -continue amlodipine  Disposition pending pacemaker implantation  For questions or updates, please contact [place] Please consult [email or web address] for contact info under     Signed, Buford Dresser, MD  07/30/2020, 8:37 AM

## 2020-07-30 NOTE — Progress Notes (Signed)
Initial Nutrition Assessment  DOCUMENTATION CODES:   Non-severe (moderate) malnutrition in context of chronic illness  INTERVENTION:   -Ensure Enlive po TID, each supplement provides 350 kcal and 20 grams of protein -MVI with minerals daily  NUTRITION DIAGNOSIS:   Moderate Malnutrition related to chronic illness (COPD) as evidenced by percent weight loss,energy intake < 75% for > or equal to 1 month,mild fat depletion,moderate fat depletion,mild muscle depletion,moderate muscle depletion.  GOAL:   Patient will meet greater than or equal to 90% of their needs  MONITOR:   PO intake,Supplement acceptance,Labs,Weight trends,Skin,Diet advancement,I & O's  REASON FOR ASSESSMENT:   Malnutrition Screening Tool    ASSESSMENT:   86 year old male with history of CAD status post CABG, permanent atrial fibrillation on Eliquis, hypertension, COPD who was admitted on 07/28/2020 as a direct transfer from [facility] for A. fib with SVR and symptomatic bradycardia.  Pt admitted with a-fib with SVR/ sick sinus syndrome.   Reviewed I/O's: -250 ml x 24 hours and -300 ml x 24 hours  UOP: 850 ml x 24 hours  Pt currently NPO for pacemaker placement today.   Spoke with pt at bedside, who was pleasant and in good spirits today. He reports a general decline in health over the past 3 months, when he reports he was hospitalized for a hip fracture. He reports he has been unable to get up and walk since then.   Per pt, he has been experiencing decreased appetite. The last time he ate was yesterday morning. PTA, pt consumes 2 meals per day, which consist of oatmeal OR rice or sweet potato. He also reports consuming approximately one Ensure supplement daily.  Pt endorses wt loss, however, unsure of UBW or when wt loss started. Reviewed wt hx; pt has experienced a 8.5% wt loss over the past 3 months, which is significant for time frame.   Discussed importance of good meal and supplement intake to  promote healing. Pt amenable to Ensure supplements post-operatively.   Labs reviewed.   NUTRITION - FOCUSED PHYSICAL EXAM:  Flowsheet Row Most Recent Value  Orbital Region Mild depletion  Upper Arm Region Mild depletion  Thoracic and Lumbar Region No depletion  Buccal Region Mild depletion  Temple Region Moderate depletion  Clavicle Bone Region Moderate depletion  Clavicle and Acromion Bone Region Moderate depletion  Scapular Bone Region Moderate depletion  Dorsal Hand Mild depletion  Patellar Region Moderate depletion  Anterior Thigh Region Moderate depletion  Posterior Calf Region Moderate depletion  Edema (RD Assessment) None  Hair Reviewed  Eyes Reviewed  Mouth Reviewed  Skin Reviewed  Nails Reviewed       Diet Order:   Diet Order            Diet Heart Room service appropriate? Yes; Fluid consistency: Thin  Diet effective now                 EDUCATION NEEDS:   Education needs have been addressed  Skin:  Skin Assessment: Skin Integrity Issues: Skin Integrity Issues:: Stage I Stage I: sacrum, lt heel  Last BM:  07/26/20  Height:   Ht Readings from Last 1 Encounters:  07/28/20 5\' 11"  (1.803 m)    Weight:   Wt Readings from Last 1 Encounters:  07/30/20 70.5 kg    Ideal Body Weight:  78.2 kg  BMI:  Body mass index is 21.68 kg/m.  Estimated Nutritional Needs:   Kcal:  1900-2100  Protein:  90-105 grams  Fluid:  > 1.9  Ricka Burdock, RD, LDN, [place] Registered Dietitian II Certified Diabetes Care and Education Specialist Please refer to [facility] for RD and/or RD on-call/weekend/after hours pager

## 2020-07-30 NOTE — Consult Note (Addendum)
ELECTROPHYSIOLOGY CONSULT NOTE    Patient ID: Cameron Willis MRN: [id number], DOB/AGE: 1933-08-31 86 y.o.  Admit date: 07/28/2020 Date of Consult: 07/30/2020  Primary Physician: Crist Fat, MD Primary Cardiologist: Thomasene Ripple, DO  Electrophysiologist: New  Referring Provider: Dr. Allyson Sabal  Patient Profile: Cameron Willis is a 86 y.o. male with a history of CAD s/p CABG, permanent AF on Eliquis, HTN, COPD who is being seen today for the evaluation of symptomatic bradycardia at the request of Dr. Allyson Sabal.  HPI:  Cameron Willis is a 86 y.o. male with medical history above. She presented to [facility] and was noted to be in AF with HRs in the 30s with presyncope. Transferred to [facility] for consideration for PPM implantation.   7-day heart monitor obtained in July 2021 revealed minimal heart rate 28 bpm, maximum heart rate of 148 bpm, average heart rate 48 bpm, 100% atrial fibrillation burden, 27 pulses present with longest lasted 3.7 seconds, 1 minute of ventricular tachycardia lasting 16 beats with a maximal heart rate of 146 bpm.  Due to the presence of nonsustained VT, patient was placed on amiodarone therapy.  Beta-blocker was not initiated due to underlying bradycardia.  Last Myoview obtained on 03/08/2020 showed EF greater than 65%, medium defect of moderate severity present in the basal inferior and basal inferolateral location, overall low risk study with diaphragmatic attenuation, no ischemia or infarct.  Echocardiogram obtained on 03/15/2020 showed EF 60 to 65%, no regional wall motion abnormality, moderate LAE.  Seen at [facility] 05/2020 with ? Syncope. Amiodarone was stopped.   Pt presented with chest pain 07/27/2020 after stopping his acid reflux medication. Troponin was negative. Pt noted to be poor historian. Has noted pre-syncopal symptoms, especially in the mornings.  This am he is feeling OK.    Past Medical History:  Diagnosis Date  . Anxiety and depression   . Bradycardia  by electrocardiogram 01/20/2018  . Cervical disc disorder at C4-C5 level with myelopathy 12/05/2016  . Chronic atrial fibrillation (HCC) 03/01/2018  . Contracture of right knee 12/05/2016  . COPD (chronic obstructive pulmonary disease) (HCC) 03/22/2012  . Coronary artery disease involving native coronary artery of native heart without angina pectoris 03/22/2012   Overview:  Coronary artery bypass graft August 2013 LIMA to LAD and SVG to RCA and SVG to obtuse marginal  . Dyslipidemia 03/26/2015  . Esophageal stenosis   . Gait disturbance 10/14/2017  . GERD (gastroesophageal reflux disease) 03/22/2012  . Hemorrhoids   . History of degenerative disc disease   . HTN (hypertension) 03/22/2012  . Hypertrophy of prostate   . MI, old 03/22/2012  . NSVT (nonsustained ventricular tachycardia) (HCC) 04/05/2020  . Osteoarthrosis   . Paroxysmal atrial fibrillation (HCC) 03/26/2015   Overview:  Chads score 2, was anticoagulated but developed tarry stool optic admission was withdrawn for now scheduled to see gastroenterologist  . Persistent atrial fibrillation (HCC) 01/20/2018  . Pre-op evaluation 03/25/2016  . S/P CABG x 3 03/25/2012  . Tobacco abuse 03/22/2012   Overview:  Quit 2 years ago     Surgical History:  Past Surgical History:  Procedure Laterality Date  . CARDIAC CATHETERIZATION    . COLONOSCOPY  05/07/2015   Colonic polyps status post polypectomy. Internal hemorrhoids ( likely etiology of rectal bleeding)   . CORONARY ARTERY BYPASS GRAFT    . ESOPHAGOGASTRODUODENOSCOPY  11/06/2005   Smal hiatal hernia. Moderate gastritis  . HEMORRHOID SURGERY  1996     Medications Prior to Admission  Medication Sig Dispense Refill Last Dose  . acetaminophen (TYLENOL) 325 MG tablet Take 650 mg by mouth daily.   07/27/2020 at Unknown time  . amLODipine (NORVASC) 5 MG tablet Take 5 mg by mouth daily.   07/28/2020 at Unknown time  . CALCIUM PO Take 1 tablet by mouth daily.   07/27/2020 at Unknown time  . celecoxib  (CELEBREX) 200 MG capsule Take 200 mg by mouth daily.   07/28/2020 at Unknown time  . cholecalciferol (VITAMIN D3) 25 MCG (1000 UNIT) tablet Take 1,000 Units by mouth daily.   07/27/2020 at Unknown time  . ELIQUIS 5 MG TABS tablet Take 1 tablet (5 mg total) by mouth 2 (two) times daily. 90 tablet 3 07/27/2020 at 0830  . nitroGLYCERIN (NITROSTAT) 0.4 MG SL tablet Place 1 tablet (0.4 mg total) under the tongue as needed for chest pain. 30 tablet 6 unknown  . pantoprazole (PROTONIX) 40 MG tablet Take 40 mg by mouth daily.   07/28/2020 at Unknown time  . amiodarone (PACERONE) 200 MG tablet Take 200 mg per one tablet by mouth twice daily for three days. Then take 200 mg per one tablet by mouth once daily. (Patient not taking: No sig reported) 90 tablet 3 Completed Course at Unknown time  . meclizine (ANTIVERT) 12.5 MG tablet Take 1 tablet by mouth as needed for dizziness.   unknown    Inpatient Medications:  . amLODipine  5 mg Oral Daily  . pantoprazole  40 mg Oral Daily    Allergies:  Allergies  Allergen Reactions  . Aciphex [Rabeprazole Sodium] Hives  . Lipitor [Atorvastatin]   . Omeprazole Hives  . Pravachol [Pravastatin]     Social History   Socioeconomic History  . Marital status: Married    Spouse name: Not on file  . Number of children: Not on file  . Years of education: Not on file  . Highest education level: Not on file  Occupational History  . Not on file  Tobacco Use  . Smoking status: Former Research scientist (life sciences)  . Smokeless tobacco: Never Used  Vaping Use  . Vaping Use: Never used  Substance and Sexual Activity  . Alcohol use: No  . Drug use: No  . Sexual activity: Not on file  Other Topics Concern  . Not on file  Social History Narrative  . Not on file   Social Determinants of Health   Financial Resource Strain: Not on file  Food Insecurity: Not on file  Transportation Needs: Not on file  Physical Activity: Not on file  Stress: Not on file  Social Connections: Not on  file  Intimate Partner Violence: Not on file     Family History  Problem Relation Age of Onset  . Cancer Father   . Esophageal cancer Father   . Colon cancer Neg Hx      Review of Systems: All other systems reviewed and are otherwise negative except as noted above.  Physical Exam: Vitals:   07/29/20 0900 07/29/20 1301 07/29/20 2000 07/30/20 0555  BP:  (!) 147/72 130/63 123/83  Pulse:  (!) 56 (!) 50 (!) 42  Resp:  18 17 18   Temp: 97.6 F (36.4 C) 97.6 F (36.4 C) 97.8 F (36.6 C) 98.3 F (36.8 C)  TempSrc: Oral Axillary Oral Oral  SpO2:  96% 95% 100%  Weight:    70.5 kg  Height:        GEN- The patient is well appearing, alert and oriented x 3 today.  HEENT: normocephalic, atraumatic; sclera clear, conjunctiva pink; hearing intact; oropharynx clear; neck supple Lungs- Clear to ausculation bilaterally, normal work of breathing.  No wheezes, rales, rhonchi Heart- Regular rate and rhythm, no murmurs, rubs or gallops GI- soft, non-tender, non-distended, bowel sounds present Extremities- no clubbing, cyanosis, or edema; DP/PT/radial pulses 2+ bilaterally MS- no significant deformity or atrophy Skin- warm and dry, no rash or lesion Psych- euthymic mood, full affect Neuro- strength and sensation are intact  Labs:   Lab Results  Component Value Date   WBC 6.5 07/29/2020   HGB 13.7 07/29/2020   HCT 42.3 07/29/2020   MCV 99.8 07/29/2020   PLT 191 07/29/2020    Recent Labs  Lab 07/29/20 0441  NA 138  K 4.1  CL 105  CO2 23  BUN 19  CREATININE 1.41*  CALCIUM 9.3  GLUCOSE 85      Radiology/Studies: ECHOCARDIOGRAM LIMITED  Result Date: 07/29/2020    ECHOCARDIOGRAM LIMITED REPORT   Patient Name:   Cameron Willis Date of Exam: 07/29/2020 Medical Rec #:  [id number]     Height:       71.0 in Accession #:    [id number]    Weight:       151.0 lb Date of Birth:  October 01, 1933     BSA:          1.871 m Patient Age:    77 years      BP:           147/72 mmHg Patient Gender: M              HR:           40 bpm. Exam Location:  Inpatient Procedure: Limited Echo Indications:    Elevated BNP. Check EF  History:        Patient has prior history of Echocardiogram examinations, most                 recent 03/15/2020. Prior CABG.  Sonographer:    Merrie Roof RDCS Referring Phys: [id number] HAO MENG  Sonographer Comments: This was a limited echo to check EF IMPRESSIONS  1. This was a limited study for evaluation of LVEF that is normal estimated at 60-65% and unchanged from prior study on 03/15/2020.  2. Left ventricular ejection fraction, by estimation, is 60 to 65%. The left ventricle has normal function. The left ventricle has no regional wall motion abnormalities. Left ventricular diastolic function could not be evaluated.  3. Right ventricular systolic function is normal. The right ventricular size is normal.  4. Left atrial size was mildly dilated.  5. The mitral valve is grossly normal.  6. The aortic valve is normal in structure. FINDINGS  Left Ventricle: Left ventricular ejection fraction, by estimation, is 60 to 65%. The left ventricle has normal function. The left ventricle has no regional wall motion abnormalities. The left ventricular internal cavity size was normal in size. There is  no left ventricular hypertrophy. Left ventricular diastolic function could not be evaluated. Right Ventricle: The right ventricular size is normal. No increase in right ventricular wall thickness. Right ventricular systolic function is normal. Left Atrium: Left atrial size was mildly dilated. Right Atrium: Right atrial size was normal in size. Pericardium: There is no evidence of pericardial effusion. Mitral Valve: The mitral valve is grossly normal. Tricuspid Valve: The tricuspid valve is not assessed. No evidence of tricuspid stenosis. Aortic Valve: The aortic valve is normal in structure. Pulmonic Valve: The pulmonic valve was  normal in structure. Pulmonic valve regurgitation is not visualized. No evidence  of pulmonic stenosis. IAS/Shunts: No atrial level shunt detected by color flow Doppler. LEFT VENTRICLE PLAX 2D LVIDd:         3.70 cm LVIDs:         2.70 cm LV PW:         1.00 cm LV IVS:        0.90 cm  LV Volumes (MOD) LV vol d, MOD A4C: 56.8 ml LV vol s, MOD A4C: 21.4 ml LV SV MOD A4C:     56.8 ml LEFT ATRIUM         Index LA diam:    4.50 cm 2.40 cm/m   AORTA Ao Root diam: 3.50 cm Ena Dawley MD Electronically signed by Ena Dawley MD Signature Date/Time: 07/29/2020/3:33:36 PM    Final     EKG: 12/26 shows AF with slow VR at 20 (personally reviewed)  TELEMETRY: Underlying AF with HRs in 30-50s (personally reviewed)  Assessment/Plan: 1.  SSS/Tachy brady syndrome Permanent AF now with slow, symptomatic rates and not on any AV nodal blockade.  EF normal by limited echo 07/29/20. Explained risks, benefits, and alternatives to PPM implantation, including but not limited to bleeding, infection, pneumothorax, pericardial effusion, lead dislodgement, heart attack, stroke, or death.  Pt verbalized understanding and agrees to proceed.  2. Permanent AF He is on Eliquis for stroke prevention with CHA2DS2VASC of at least 4.   Last evenings dose held.   3. Atypical chest pain Troponins negative.  Recent stress test in 03/2020 with diaphragmatic attenuation but no ischemia  4. HTN Adjust meds as needed s/p PPM.   5. CAD s/p CABG Recent stress test unremarkable. Denies exertion chest pain.   For questions or updates, please contact [place] Please consult [email or web address] for contact info under Cardiology/STEMI.  Jacalyn Lefevre, PA-C  07/30/2020 6:51 AM   EP Attending  Patient seen and examined. Agree with above. The patient presents with symptomatic bradycardia and chronic atrial fib with a h/o syncope and CHB. He is not on any AV nodal blocking except for amiodarone which is used for VT. I have discussed the indications/risks/benefits/goals/expectations of PPM  insertion and he is willing to proceed.   Carleene Overlie Merl Bommarito,MD

## 2020-07-31 ENCOUNTER — Encounter (HOSPITAL_COMMUNITY): Payer: Self-pay | Admitting: Internal Medicine

## 2020-07-31 ENCOUNTER — Inpatient Hospital Stay (HOSPITAL_COMMUNITY): Payer: Medicare HMO

## 2020-07-31 DIAGNOSIS — I482 Chronic atrial fibrillation, unspecified: Secondary | ICD-10-CM | POA: Diagnosis not present

## 2020-07-31 DIAGNOSIS — I495 Sick sinus syndrome: Secondary | ICD-10-CM | POA: Diagnosis not present

## 2020-07-31 DIAGNOSIS — E440 Moderate protein-calorie malnutrition: Secondary | ICD-10-CM | POA: Diagnosis not present

## 2020-07-31 DIAGNOSIS — I4891 Unspecified atrial fibrillation: Secondary | ICD-10-CM | POA: Diagnosis not present

## 2020-07-31 DIAGNOSIS — I2510 Atherosclerotic heart disease of native coronary artery without angina pectoris: Secondary | ICD-10-CM | POA: Diagnosis not present

## 2020-07-31 LAB — CBC
HCT: 42.3 % (ref 39.0–52.0)
Hemoglobin: 14.7 g/dL (ref 13.0–17.0)
MCH: 33.3 pg (ref 26.0–34.0)
MCHC: 34.8 g/dL (ref 30.0–36.0)
MCV: 95.9 fL (ref 80.0–100.0)
Platelets: 210 10*3/uL (ref 150–400)
RBC: 4.41 MIL/uL (ref 4.22–5.81)
RDW: 13.1 % (ref 11.5–15.5)
WBC: 12.5 10*3/uL — ABNORMAL HIGH (ref 4.0–10.5)
nRBC: 0 % (ref 0.0–0.2)

## 2020-07-31 LAB — BASIC METABOLIC PANEL
Anion gap: 13 (ref 5–15)
BUN: 22 mg/dL (ref 8–23)
CO2: 20 mmol/L — ABNORMAL LOW (ref 22–32)
Calcium: 9.4 mg/dL (ref 8.9–10.3)
Chloride: 101 mmol/L (ref 98–111)
Creatinine, Ser: 1.4 mg/dL — ABNORMAL HIGH (ref 0.61–1.24)
GFR, Estimated: 50 mL/min — ABNORMAL LOW (ref >60)
Glucose, Bld: 98 mg/dL (ref 70–99)
Potassium: 4.9 mmol/L (ref 3.5–5.1)
Sodium: 134 mmol/L — ABNORMAL LOW (ref 135–145)

## 2020-07-31 MED ORDER — MUPIROCIN 2 % EX OINT: 1.0000 "application " | TOPICAL_OINTMENT | Freq: Two times a day (BID) | CUTANEOUS | 0 refills | Status: DC

## 2020-07-31 MED ORDER — ELIQUIS 5 MG PO TABS: 5.0000 mg | ORAL_TABLET | Freq: Two times a day (BID) | ORAL | 3 refills | Status: DC

## 2020-07-31 NOTE — Discharge Summary (Addendum)
ELECTROPHYSIOLOGY PROCEDURE DISCHARGE SUMMARY    Patient ID: Cameron Willis,  MRN: [id number], DOB/AGE: August 08, 1933 86 y.o.  Admit date: 07/28/2020 Discharge date: 07/31/2020  Primary Care Physician: Townsend Roger, MD  Primary Cardiologist: Berniece Salines, DO  Electrophysiologist: Dr. Lovena Le  Primary Discharge Diagnosis:  Symptomatic bradycardia status post pacemaker implantation this admission  Secondary Discharge Diagnosis:  Intermittent CHB Longstanding persistent atrial fibrillation Syncope  Allergies  Allergen Reactions   Aciphex [Rabeprazole Sodium] Hives   Lipitor [Atorvastatin]    Omeprazole Hives   Pravachol [Pravastatin]      Procedures This Admission:  1.  Implantation of a Medtronic single chamber PPM on 07/30/2020 by Dr. Lovena Le. The patient received a Medtronic model number [id number] PPM with model number [id number] right ventricular lead in the left bundle position. There were no immediate post procedure complications. 2.  CXR on 07/31/20 demonstrated no pneumothorax status post device implantation.   Brief HPI: Cameron Willis is a 86 y.o. male was admitted for chest pain and found to have intermittent CHB with underlying longstanding persistent AF and electrophysiology team asked to see for consideration of PPM implantation.  Past medical history includes above.  The patient has had symptomatic bradycardia without reversible causes identified.  Risks, benefits, and alternatives to PPM implantation were reviewed with the patient who wished to proceed.   Hospital Course:  The patient was admitted and underwent implantation of a Medtronic single chamber PPM with details as outlined above.  He was monitored on telemetry overnight which demonstrated appropriate pacing.  Left chest was without hematoma or ecchymosis.  The device was interrogated and found to be functioning normally.  CXR was obtained and demonstrated no pneumothorax status post device implantation.  Wound  care, arm mobility, and restrictions were reviewed with the patient.  The patient was examined and considered stable for discharge to home.    Regarding blood thinner therapy, they were instructed to resume their Eliquis (medication name) on Saturday. 08/04/20 (date/time).   Physical Exam: Vitals:   07/30/20 2100 07/30/20 2329 07/31/20 0306 07/31/20 0750  BP: 129/68 140/64 134/75 120/74  Pulse: (!) 59 (!) 59 (!) 55 60  Resp:   18 18  Temp:   98.7 F (37.1 C) (!) 97.1 F (36.2 C)  TempSrc:   Oral Oral  SpO2: 95% 97% 93% 99%  Weight:   70.2 kg   Height:        GEN- The patient is well appearing, alert and oriented x 3 today.   HEENT: normocephalic, atraumatic; sclera clear, conjunctiva pink; hearing intact; oropharynx clear; neck supple, no JVP Lymph- no cervical lymphadenopathy Lungs- Clear to ausculation bilaterally, normal work of breathing.  No wheezes, rales, rhonchi Heart- Regular rate and rhythm, no murmurs, rubs or gallops, PMI not laterally displaced GI- soft, non-tender, non-distended, bowel sounds present, no hepatosplenomegaly Extremities- no clubbing, cyanosis, or edema; DP/PT/radial pulses 2+ bilaterally MS- no significant deformity or atrophy Skin- warm and dry, no rash or lesion, left chest without hematoma/ecchymosis Psych- euthymic mood, full affect Neuro- strength and sensation are intact   Labs:   Lab Results  Component Value Date   WBC 12.5 (H) 07/31/2020   HGB 14.7 07/31/2020   HCT 42.3 07/31/2020   MCV 95.9 07/31/2020   PLT 210 07/31/2020    Recent Labs  Lab 07/31/20 0152  NA 134*  K 4.9  CL 101  CO2 20*  BUN 22  CREATININE 1.40*  CALCIUM 9.4  GLUCOSE 98  Discharge Medications:  Allergies as of 07/31/2020       Reactions   Aciphex [rabeprazole Sodium] Hives   Lipitor [atorvastatin]    Omeprazole Hives   Pravachol [pravastatin]         Medication List     TAKE these medications    acetaminophen 325 MG tablet Commonly known  as: TYLENOL Take 650 mg by mouth daily.   amiodarone 200 MG tablet Commonly known as: PACERONE Take 200 mg per one tablet by mouth twice daily for three days. Then take 200 mg per one tablet by mouth once daily.   amLODipine 5 MG tablet Commonly known as: NORVASC Take 5 mg by mouth daily.   CALCIUM PO Take 1 tablet by mouth daily.   celecoxib 200 MG capsule Commonly known as: CELEBREX Take 200 mg by mouth daily.   cholecalciferol 25 MCG (1000 UNIT) tablet Commonly known as: VITAMIN D3 Take 1,000 Units by mouth daily.   Eliquis 5 MG Tabs tablet Generic drug: apixaban Take 1 tablet (5 mg total) by mouth 2 (two) times daily. Resume Saturday with the evening dose. Start taking on: August 04, 2020 What changed:  additional instructions These instructions start on August 04, 2020. If you are unsure what to do until then, ask your doctor or other care provider.   meclizine 12.5 MG tablet Commonly known as: ANTIVERT Take 1 tablet by mouth as needed for dizziness.   mupirocin ointment 2 % Commonly known as: BACTROBAN Place 1 application into the nose 2 (two) times daily.   nitroGLYCERIN 0.4 MG SL tablet Commonly known as: Nitrostat Place 1 tablet (0.4 mg total) under the tongue as needed for chest pain.   pantoprazole 40 MG tablet Commonly known as: PROTONIX Take 40 mg by mouth daily.        Disposition:     Follow-up Information     [facility] Follow up on 08/09/2020.   Why: at 2 pm for post pacemaker wound check.  Contact information: [address] [place] [phone]        Regan Lemming, MD Follow up.   Specialty: Cardiology Why: We will call you with your 3 month appointment to occur in [place]. Contact information: [address] [place] [address] [phone]                 Duration of Discharge Encounter: Greater than 30 minutes including  physician time.  Signed, Graciella Freer, PA-C  07/31/2020 8:21 AM  EP attending  Patient seen and examined.  Agree with the findings as noted above.  The patient is status post pacemaker insertion secondary to complete heart block in the setting of atrial fibrillation.  Chest x-ray demonstrates normal lead position.  No pneumothorax.  Pacemaker interrogation demonstrates normal single-chamber pacemaker function.  The patient will be discharged home and follow-up in [place].  Lewayne Bunting, MD

## 2020-07-31 NOTE — Discharge Instructions (Signed)
After Your Pacemaker   . You have a Medtronic Pacemaker  ACTIVITY . Do not lift your arm above shoulder height for 1 week after your procedure. After 7 days, you may progress as below.  . You should remove your sling 24 hours after your procedure, unless otherwise instructed by your provider.     Monday August 06, 2020  Tuesday August 07, 2020 Wednesday August 08, 2020 Thursday August 09, 2020   . Do not lift, push, pull, or carry anything over 10 pounds with the affected arm until 6 weeks (Monday September 10, 2020 ) after your procedure.   . Do NOT DRIVE until you have been seen for your wound check, or as long as instructed by your healthcare provider.   . Ask your healthcare provider when you can go back to work   INCISION/Dressing . Resume your Eliquis on Saturday, January 1st, 2022, with the evening dose.   . If large square, outer bandage is left in place, this can be removed after 24 hours from your procedure. Do not remove steri-strips or glue as below.   . Monitor your Pacemaker site for redness, swelling, and drainage. Call the device clinic at [phone] if you experience these symptoms or fever/chills.  . If your incision is sealed with Steri-strips or staples, you may shower 10 days after your procedure or when told by your provider. Do not remove the steri-strips or let the shower hit directly on your site. You may wash around your site with soap and water.    Marland Kitchen Avoid lotions, ointments, or perfumes over your incision until it is well-healed.  . You may use a hot tub or a pool AFTER your wound check appointment if the incision is completely closed.  Marland Kitchen PAcemaker Alerts:  Some alerts are vibratory and others beep. These are NOT emergencies. Please call our office to let us know. If this occurs at night or on weekends, it can wait until the next business day. Send a remote transmission.  . If your device is capable of reading fluid status (for heart failure), you will be  offered monthly monitoring to review this with you.   DEVICE MANAGEMENT . Remote monitoring is used to monitor your pacemaker from home. This monitoring is scheduled every 91 days by our office. It allows [place] to keep an eye on the functioning of your device to ensure it is working properly. You will routinely see your Electrophysiologist annually (more often if necessary).   . You should receive your ID card for your new device in 4-8 weeks. Keep this card with you at all times once received. Consider wearing a medical alert bracelet or necklace.  . Your Pacemaker may be MRI compatible. This will be discussed at your next office visit/wound check.  You should avoid contact with strong electric or magnetic fields.    Do not use amateur (ham) radio equipment or electric (arc) welding torches. MP3 player headphones with magnets should not be used. Some devices are safe to use if held at least 12 inches (30 cm) from your Pacemaker. These include power tools, lawn mowers, and speakers. If you are unsure if something is safe to use, ask your health care provider.   When using your cell phone, hold it to the ear that is on the opposite side from the Pacemaker. Do not leave your cell phone in a pocket over the Pacemaker.   You may safely use electric blankets, heating pads, computers, and microwave ovens.  Call the office right away if:  You have chest pain.  You feel more short of breath than you have felt before.  You feel more light-headed than you have felt before.  Your incision starts to open up.  This information is not intended to replace advice given to you by your health care provider. Make sure you discuss any questions you have with your health care provider.

## 2020-07-31 NOTE — Progress Notes (Signed)
Patient is noncompliant with keeping on sling to left arm, pulse ox, and telemetry leads. Education provided on the importance of these things. Patient removed sling and pulse ox; states he doesn't want to wear the sling anymore because it's "choking him to death."

## 2020-07-31 NOTE — Progress Notes (Signed)
Progress Note  Patient Name: Cameron Willis Date of Encounter: 07/31/2020  [facility] Cardiologist: Lavona Mound Tobb, DO   Subjective   S/P PPM yesterday. Hasn't tolerated sling well, educated on importance of protecting the implant site. No chest pain or shortness of breath. Anxious to go home.  Inpatient Medications    Scheduled Meds: . amLODipine  5 mg Oral Daily  . Chlorhexidine Gluconate Cloth  6 each Topical Q0600  . feeding supplement  237 mL Oral TID BM  . multivitamin with minerals  1 tablet Oral Daily  . mupirocin ointment  1 application Nasal BID  . pantoprazole  40 mg Oral Daily   Continuous Infusions: .  ceFAZolin (ANCEF) IV 1 g (07/31/20 0337)   PRN Meds: acetaminophen, acetaminophen, meclizine, ondansetron (ZOFRAN) IV   Vital Signs    Vitals:   07/30/20 2100 07/30/20 2329 07/31/20 0306 07/31/20 0750  BP: 129/68 140/64 134/75 120/74  Pulse: (!) 59 (!) 59 (!) 55 60  Resp:   18 18  Temp:   98.7 F (37.1 C) (!) 97.1 F (36.2 C)  TempSrc:   Oral Oral  SpO2: 95% 97% 93% 99%  Weight:   70.2 kg   Height:       No intake or output data in the 24 hours ending 07/31/20 0842 Last 3 Weights 07/31/2020 07/30/2020 07/29/2020  Weight (lbs) 154 lb 10.9 oz 155 lb 6.8 oz 151 lb 0.2 oz  Weight (kg) 70.162 kg 70.5 kg 68.5 kg      Telemetry    Atrial fibrillation, with ventricular paced rhythm - Personally Reviewed  ECG    12/28 coarse afib vs atypical flutter with ventricular pacing at 60 bpm  - Personally Reviewed  Physical Exam   GEN: Well nourished, well developed in no acute distress NECK: No JVD CARDIAC: regular rhythm, normal S1 and S2, no rubs or gallops. No murmur. Chest site of PPM implantation c/d/i VASCULAR: Radial pulses 2+ bilaterally.  RESPIRATORY:  Clear to auscultation without rales, wheezing or rhonchi  ABDOMEN: Soft, non-tender, non-distended MUSCULOSKELETAL:  Moves all 4 limbs independently SKIN: Warm and dry, no edema NEUROLOGIC:  No  focal neuro deficits noted. PSYCHIATRIC:  Normal affect   Labs    High Sensitivity Troponin:  No results for input(s): TROPONINIHS in the last 720 hours.    Chemistry Recent Labs  Lab 07/29/20 0441 07/31/20 0152  NA 138 134*  K 4.1 4.9  CL 105 101  CO2 23 20*  GLUCOSE 85 98  BUN 19 22  CREATININE 1.41* 1.40*  CALCIUM 9.3 9.4  GFRNONAA 49* 50*  ANIONGAP 10 13     Hematology Recent Labs  Lab 07/29/20 0823 07/31/20 0152  WBC 6.5 12.5*  RBC 4.24 4.41  HGB 13.7 14.7  HCT 42.3 42.3  MCV 99.8 95.9  MCH 32.3 33.3  MCHC 32.4 34.8  RDW 13.3 13.1  PLT 191 210    BNP Recent Labs  Lab 07/28/20 1527  BNP 282.4*     DDimer No results for input(s): DDIMER in the last 168 hours.   Radiology    DG Chest 2 View  Result Date: 07/31/2020 CLINICAL DATA:  Status post pacemaker placement. EXAM: CHEST - 2 VIEW COMPARISON:  07/27/2020. FINDINGS: Interim placement of cardiac pacer with lead tip over right ventricle. Prior CABG. Heart size stable. No focal infiltrate. No pleural effusion or pneumothorax. No acute bony abnormality identified. IMPRESSION: 1. Interim placement of cardiac pacer with lead tip over right ventricle. Prior  CABG. Heart size normal. 2. No acute pulmonary disease.  No evidence of pneumothorax. Electronically Signed   By: Marcello Moores  Register   On: 07/31/2020 06:46   EP PPM/ICD IMPLANT  Result Date: 07/30/2020 CONCLUSIONS:  1. Successful implantation of a medtronic single-chamber pacemaker for symptomatic bradycardia due to intermittent CHB and chronic atrial fib  2. No early apparent complications.       Cristopher Peru, MD 07/30/2020 5:28 PM   ECHOCARDIOGRAM LIMITED  Result Date: 07/29/2020    ECHOCARDIOGRAM LIMITED REPORT   Patient Name:   Cameron Willis Date of Exam: 07/29/2020 Medical Rec #:  [id number]     Height:       71.0 in Accession #:    [id number]    Weight:       151.0 lb Date of Birth:  27-Nov-1933     BSA:          1.871 m Patient Age:    86 years       BP:           147/72 mmHg Patient Gender: M             HR:           40 bpm. Exam Location:  Inpatient Procedure: Limited Echo Indications:    Elevated BNP. Check EF  History:        Patient has prior history of Echocardiogram examinations, most                 recent 03/15/2020. Prior CABG.  Sonographer:    Merrie Roof RDCS Referring Phys: [id number] HAO MENG  Sonographer Comments: This was a limited echo to check EF IMPRESSIONS  1. This was a limited study for evaluation of LVEF that is normal estimated at 60-65% and unchanged from prior study on 03/15/2020.  2. Left ventricular ejection fraction, by estimation, is 60 to 65%. The left ventricle has normal function. The left ventricle has no regional wall motion abnormalities. Left ventricular diastolic function could not be evaluated.  3. Right ventricular systolic function is normal. The right ventricular size is normal.  4. Left atrial size was mildly dilated.  5. The mitral valve is grossly normal.  6. The aortic valve is normal in structure. FINDINGS  Left Ventricle: Left ventricular ejection fraction, by estimation, is 60 to 65%. The left ventricle has normal function. The left ventricle has no regional wall motion abnormalities. The left ventricular internal cavity size was normal in size. There is  no left ventricular hypertrophy. Left ventricular diastolic function could not be evaluated. Right Ventricle: The right ventricular size is normal. No increase in right ventricular wall thickness. Right ventricular systolic function is normal. Left Atrium: Left atrial size was mildly dilated. Right Atrium: Right atrial size was normal in size. Pericardium: There is no evidence of pericardial effusion. Mitral Valve: The mitral valve is grossly normal. Tricuspid Valve: The tricuspid valve is not assessed. No evidence of tricuspid stenosis. Aortic Valve: The aortic valve is normal in structure. Pulmonic Valve: The pulmonic valve was normal in structure. Pulmonic valve  regurgitation is not visualized. No evidence of pulmonic stenosis. IAS/Shunts: No atrial level shunt detected by color flow Doppler. LEFT VENTRICLE PLAX 2D LVIDd:         3.70 cm LVIDs:         2.70 cm LV PW:         1.00 cm LV IVS:        0.90 cm  LV  Volumes (MOD) LV vol d, MOD A4C: 56.8 ml LV vol s, MOD A4C: 21.4 ml LV SV MOD A4C:     56.8 ml LEFT ATRIUM         Index LA diam:    4.50 cm 2.40 cm/m   AORTA Ao Root diam: 3.50 cm Ena Dawley MD Electronically signed by Ena Dawley MD Signature Date/Time: 07/29/2020/3:33:36 PM    Final     Cardiac Studies   Echo limited 07/29/20 1. This was a limited study for evaluation of LVEF that is normal  estimated at 60-65% and unchanged from prior study on 03/15/2020.  2. Left ventricular ejection fraction, by estimation, is 60 to 65%. The  left ventricle has normal function. The left ventricle has no regional  wall motion abnormalities. Left ventricular diastolic function could not  be evaluated.  3. Right ventricular systolic function is normal. The right ventricular  size is normal.  4. Left atrial size was mildly dilated.  5. The mitral valve is grossly normal.  6. The aortic valve is normal in structure.   Patient Profile     86 y.o. male with PMH of CAD s/p CABG 2013, HTN, HLD, COPD and apparently atrial fibrillation on Eliquis presented to [facility] with chest pain, however noted to have bradycardia concerning for SSS and transferred to [facility] for consideration of possible pacemaker.   Assessment & Plan    Atrial fibrillation, with slow ventricular response -transferred from [facility] given concern for presyncope/symptomatic bradycardia -on no AV nodal agents currently, amiodarone held on admission -EP following, s/p pacemaker implant 12/27 -CHA2DS2/VAS Stroke Risk Points= 4, resume apixaban when cleared post pacemaker  CAD, s/p CABG: atypical chest pain reported, now resolved -negative troponins at  [facility] -recent stress test unremarkable -no aspirin as he is on apixaban -not currently on statin. Reported intolerance to atorvastatin and pravastatin. Defer to Dr. Harriet Masson as primary cardiologist, follow up as outpatient  Hypertension: -continue amlodipine  Moderate malnutrition Nutrition Status: Nutrition Problem: Moderate Malnutrition Etiology: chronic illness (COPD) Signs/Symptoms: percent weight loss,energy intake < 75% for > or equal to 1 month,mild fat depletion,moderate fat depletion,mild muscle depletion,moderate muscle depletion Percent weight loss: 8.5 % Interventions: Ensure Enlive (each supplement provides 350kcal and 20 grams of protein),MVI  Plan for discharge today.  For questions or updates, please contact [place] Please consult [email or web address] for contact info under     Signed, Buford Dresser, MD  07/31/2020, 8:42 AM

## 2020-08-01 ENCOUNTER — Other Ambulatory Visit: Payer: Self-pay

## 2020-08-01 ENCOUNTER — Telehealth: Payer: Self-pay | Admitting: Cardiology

## 2020-08-01 NOTE — Telephone Encounter (Signed)
Reviewed the medication as dc'd in the hospital. Jasmine December verbalized understanding and had no additional questions.

## 2020-08-01 NOTE — Patient Outreach (Signed)
[facility]) Care Management  08/01/2020  Cameron Willis 1934/01/02 [id number]     Transition of Care Referral  Referral Date: 08/01/2020 Referral Source: [facility] Discharge Report Date of Discharge: 07/31/2020 Facility: [facility] Insurance: [facility]   Referral received. Transition of care calls being completed via EMMI-automated calls. RN CM will outreach patient for any red flags received.     Plan: RN CM will close case at this time.    Antionette Fairy, RN,BSN,CCM [facility] Care Management Telephonic Care Management Coordinator Direct Phone: [phone] Toll Free: [phone] Fax: [phone]

## 2020-08-01 NOTE — Telephone Encounter (Signed)
New Message  Pt c/o medication issue:  1. Name of Medication: amiodarone (PACERONE) 200 MG tablet  amLODipine (NORVASC) 5 MG tablet  2. How are you currently taking this medication (dosage and times per day)? Not yet taken today, pt currently takes amLODipine (NORVASC) 5 MG tablet daily   3. Are you having a reaction (difficulty breathing--STAT)? No   4. What is your medication issue?  amiodarone (PACERONE) 200 MG tablet Says Dr Servando Salina took him off medication but now he is being put back on it. She wants to make sure he is suppose to start it again  amLODipine (NORVASC) 5 MG tablet He has been taking this medication but wants to make sure he is to continue taking   Please call

## 2020-08-07 DIAGNOSIS — K219 Gastro-esophageal reflux disease without esophagitis: Secondary | ICD-10-CM | POA: Diagnosis not present

## 2020-08-07 DIAGNOSIS — Z7901 Long term (current) use of anticoagulants: Secondary | ICD-10-CM | POA: Diagnosis not present

## 2020-08-07 DIAGNOSIS — R001 Bradycardia, unspecified: Secondary | ICD-10-CM | POA: Diagnosis not present

## 2020-08-07 DIAGNOSIS — Z48812 Encounter for surgical aftercare following surgery on the circulatory system: Secondary | ICD-10-CM | POA: Diagnosis not present

## 2020-08-07 DIAGNOSIS — I10 Essential (primary) hypertension: Secondary | ICD-10-CM | POA: Diagnosis not present

## 2020-08-07 DIAGNOSIS — I4811 Longstanding persistent atrial fibrillation: Secondary | ICD-10-CM | POA: Diagnosis not present

## 2020-08-07 DIAGNOSIS — E871 Hypo-osmolality and hyponatremia: Secondary | ICD-10-CM | POA: Diagnosis not present

## 2020-08-07 DIAGNOSIS — I2510 Atherosclerotic heart disease of native coronary artery without angina pectoris: Secondary | ICD-10-CM | POA: Diagnosis not present

## 2020-08-07 DIAGNOSIS — Z8701 Personal history of pneumonia (recurrent): Secondary | ICD-10-CM | POA: Diagnosis not present

## 2020-08-09 ENCOUNTER — Ambulatory Visit (INDEPENDENT_AMBULATORY_CARE_PROVIDER_SITE_OTHER): Payer: Medicare HMO | Admitting: Emergency Medicine

## 2020-08-09 ENCOUNTER — Other Ambulatory Visit: Payer: Self-pay

## 2020-08-09 DIAGNOSIS — Z950 Presence of cardiac pacemaker: Secondary | ICD-10-CM | POA: Diagnosis not present

## 2020-08-09 DIAGNOSIS — I442 Atrioventricular block, complete: Secondary | ICD-10-CM

## 2020-08-09 LAB — CUP PACEART INCLINIC DEVICE CHECK
Battery Remaining Longevity: 161 mo
Battery Voltage: 3.22 V
Brady Statistic RV Percent Paced: 98.87 %
Date Time Interrogation Session: 20220106141800
Implantable Lead Implant Date: 20211227
Implantable Lead Location: 753860
Implantable Lead Model: 3830
Implantable Pulse Generator Implant Date: 20211227
Lead Channel Impedance Value: 532 Ohm
Lead Channel Impedance Value: 703 Ohm
Lead Channel Pacing Threshold Amplitude: 0.5 V
Lead Channel Pacing Threshold Pulse Width: 0.4 ms
Lead Channel Sensing Intrinsic Amplitude: 14.625 mV
Lead Channel Setting Pacing Amplitude: 3.5 V
Lead Channel Setting Pacing Pulse Width: 0.4 ms
Lead Channel Setting Sensing Sensitivity: 0.9 mV

## 2020-08-09 NOTE — Progress Notes (Signed)
Wound check appointment. Steri-strips removed. Wound without redness or edema. Incision edges approximated, wound well healed. Normal device function. Thresholds, sensing, and impedances consistent with implant measurements. Device programmed at 3.5V/auto capture programmed on for extra safety margin until 3 month visit. Histogram distribution appropriate for patient and level of activity. No high ventricular rates noted. Patient educated about wound care, arm mobility, lifting restrictions. ROV with Dr. Ladona Ridgel  on 11/05/20. Enrolled in remote follow-up and next remote 10/04/20.

## 2020-08-10 DIAGNOSIS — I4811 Longstanding persistent atrial fibrillation: Secondary | ICD-10-CM | POA: Diagnosis not present

## 2020-08-10 DIAGNOSIS — I10 Essential (primary) hypertension: Secondary | ICD-10-CM | POA: Diagnosis not present

## 2020-08-10 DIAGNOSIS — R001 Bradycardia, unspecified: Secondary | ICD-10-CM | POA: Diagnosis not present

## 2020-08-10 DIAGNOSIS — K219 Gastro-esophageal reflux disease without esophagitis: Secondary | ICD-10-CM | POA: Diagnosis not present

## 2020-08-10 DIAGNOSIS — E871 Hypo-osmolality and hyponatremia: Secondary | ICD-10-CM | POA: Diagnosis not present

## 2020-08-10 DIAGNOSIS — Z48812 Encounter for surgical aftercare following surgery on the circulatory system: Secondary | ICD-10-CM | POA: Diagnosis not present

## 2020-08-10 DIAGNOSIS — Z7901 Long term (current) use of anticoagulants: Secondary | ICD-10-CM | POA: Diagnosis not present

## 2020-08-10 DIAGNOSIS — I2510 Atherosclerotic heart disease of native coronary artery without angina pectoris: Secondary | ICD-10-CM | POA: Diagnosis not present

## 2020-08-10 DIAGNOSIS — Z8701 Personal history of pneumonia (recurrent): Secondary | ICD-10-CM | POA: Diagnosis not present

## 2020-08-11 DIAGNOSIS — K219 Gastro-esophageal reflux disease without esophagitis: Secondary | ICD-10-CM | POA: Diagnosis not present

## 2020-08-11 DIAGNOSIS — E871 Hypo-osmolality and hyponatremia: Secondary | ICD-10-CM | POA: Diagnosis not present

## 2020-08-11 DIAGNOSIS — Z8701 Personal history of pneumonia (recurrent): Secondary | ICD-10-CM | POA: Diagnosis not present

## 2020-08-11 DIAGNOSIS — Z48812 Encounter for surgical aftercare following surgery on the circulatory system: Secondary | ICD-10-CM | POA: Diagnosis not present

## 2020-08-11 DIAGNOSIS — I4811 Longstanding persistent atrial fibrillation: Secondary | ICD-10-CM | POA: Diagnosis not present

## 2020-08-11 DIAGNOSIS — I2510 Atherosclerotic heart disease of native coronary artery without angina pectoris: Secondary | ICD-10-CM | POA: Diagnosis not present

## 2020-08-11 DIAGNOSIS — Z7901 Long term (current) use of anticoagulants: Secondary | ICD-10-CM | POA: Diagnosis not present

## 2020-08-11 DIAGNOSIS — I10 Essential (primary) hypertension: Secondary | ICD-10-CM | POA: Diagnosis not present

## 2020-08-11 DIAGNOSIS — R001 Bradycardia, unspecified: Secondary | ICD-10-CM | POA: Diagnosis not present

## 2020-08-15 DIAGNOSIS — E871 Hypo-osmolality and hyponatremia: Secondary | ICD-10-CM | POA: Diagnosis not present

## 2020-08-15 DIAGNOSIS — I2510 Atherosclerotic heart disease of native coronary artery without angina pectoris: Secondary | ICD-10-CM | POA: Diagnosis not present

## 2020-08-15 DIAGNOSIS — Z7901 Long term (current) use of anticoagulants: Secondary | ICD-10-CM | POA: Diagnosis not present

## 2020-08-15 DIAGNOSIS — I4811 Longstanding persistent atrial fibrillation: Secondary | ICD-10-CM | POA: Diagnosis not present

## 2020-08-15 DIAGNOSIS — Z8701 Personal history of pneumonia (recurrent): Secondary | ICD-10-CM | POA: Diagnosis not present

## 2020-08-15 DIAGNOSIS — I10 Essential (primary) hypertension: Secondary | ICD-10-CM | POA: Diagnosis not present

## 2020-08-15 DIAGNOSIS — R001 Bradycardia, unspecified: Secondary | ICD-10-CM | POA: Diagnosis not present

## 2020-08-15 DIAGNOSIS — Z48812 Encounter for surgical aftercare following surgery on the circulatory system: Secondary | ICD-10-CM | POA: Diagnosis not present

## 2020-08-15 DIAGNOSIS — K219 Gastro-esophageal reflux disease without esophagitis: Secondary | ICD-10-CM | POA: Diagnosis not present

## 2020-08-21 DIAGNOSIS — I2510 Atherosclerotic heart disease of native coronary artery without angina pectoris: Secondary | ICD-10-CM | POA: Diagnosis not present

## 2020-08-21 DIAGNOSIS — I4811 Longstanding persistent atrial fibrillation: Secondary | ICD-10-CM | POA: Diagnosis not present

## 2020-08-21 DIAGNOSIS — Z8701 Personal history of pneumonia (recurrent): Secondary | ICD-10-CM | POA: Diagnosis not present

## 2020-08-21 DIAGNOSIS — R001 Bradycardia, unspecified: Secondary | ICD-10-CM | POA: Diagnosis not present

## 2020-08-21 DIAGNOSIS — K219 Gastro-esophageal reflux disease without esophagitis: Secondary | ICD-10-CM | POA: Diagnosis not present

## 2020-08-21 DIAGNOSIS — E871 Hypo-osmolality and hyponatremia: Secondary | ICD-10-CM | POA: Diagnosis not present

## 2020-08-21 DIAGNOSIS — I10 Essential (primary) hypertension: Secondary | ICD-10-CM | POA: Diagnosis not present

## 2020-08-21 DIAGNOSIS — Z48812 Encounter for surgical aftercare following surgery on the circulatory system: Secondary | ICD-10-CM | POA: Diagnosis not present

## 2020-08-21 DIAGNOSIS — Z7901 Long term (current) use of anticoagulants: Secondary | ICD-10-CM | POA: Diagnosis not present

## 2020-08-23 DIAGNOSIS — K219 Gastro-esophageal reflux disease without esophagitis: Secondary | ICD-10-CM | POA: Diagnosis not present

## 2020-08-23 DIAGNOSIS — Z48812 Encounter for surgical aftercare following surgery on the circulatory system: Secondary | ICD-10-CM | POA: Diagnosis not present

## 2020-08-23 DIAGNOSIS — R001 Bradycardia, unspecified: Secondary | ICD-10-CM | POA: Diagnosis not present

## 2020-08-23 DIAGNOSIS — E871 Hypo-osmolality and hyponatremia: Secondary | ICD-10-CM | POA: Diagnosis not present

## 2020-08-23 DIAGNOSIS — I2510 Atherosclerotic heart disease of native coronary artery without angina pectoris: Secondary | ICD-10-CM | POA: Diagnosis not present

## 2020-08-23 DIAGNOSIS — Z8701 Personal history of pneumonia (recurrent): Secondary | ICD-10-CM | POA: Diagnosis not present

## 2020-08-23 DIAGNOSIS — I10 Essential (primary) hypertension: Secondary | ICD-10-CM | POA: Diagnosis not present

## 2020-08-23 DIAGNOSIS — Z7901 Long term (current) use of anticoagulants: Secondary | ICD-10-CM | POA: Diagnosis not present

## 2020-08-23 DIAGNOSIS — I4811 Longstanding persistent atrial fibrillation: Secondary | ICD-10-CM | POA: Diagnosis not present

## 2020-08-26 DIAGNOSIS — Z48812 Encounter for surgical aftercare following surgery on the circulatory system: Secondary | ICD-10-CM | POA: Diagnosis not present

## 2020-08-26 DIAGNOSIS — I4811 Longstanding persistent atrial fibrillation: Secondary | ICD-10-CM | POA: Diagnosis not present

## 2020-08-26 DIAGNOSIS — R001 Bradycardia, unspecified: Secondary | ICD-10-CM | POA: Diagnosis not present

## 2020-08-26 DIAGNOSIS — I2510 Atherosclerotic heart disease of native coronary artery without angina pectoris: Secondary | ICD-10-CM | POA: Diagnosis not present

## 2020-09-05 DIAGNOSIS — R001 Bradycardia, unspecified: Secondary | ICD-10-CM | POA: Diagnosis not present

## 2020-09-05 DIAGNOSIS — Z8701 Personal history of pneumonia (recurrent): Secondary | ICD-10-CM | POA: Diagnosis not present

## 2020-09-05 DIAGNOSIS — I2510 Atherosclerotic heart disease of native coronary artery without angina pectoris: Secondary | ICD-10-CM | POA: Diagnosis not present

## 2020-09-05 DIAGNOSIS — K219 Gastro-esophageal reflux disease without esophagitis: Secondary | ICD-10-CM | POA: Diagnosis not present

## 2020-09-05 DIAGNOSIS — I10 Essential (primary) hypertension: Secondary | ICD-10-CM | POA: Diagnosis not present

## 2020-09-05 DIAGNOSIS — I4811 Longstanding persistent atrial fibrillation: Secondary | ICD-10-CM | POA: Diagnosis not present

## 2020-09-05 DIAGNOSIS — Z48812 Encounter for surgical aftercare following surgery on the circulatory system: Secondary | ICD-10-CM | POA: Diagnosis not present

## 2020-09-05 DIAGNOSIS — E871 Hypo-osmolality and hyponatremia: Secondary | ICD-10-CM | POA: Diagnosis not present

## 2020-09-05 DIAGNOSIS — Z7901 Long term (current) use of anticoagulants: Secondary | ICD-10-CM | POA: Diagnosis not present

## 2020-09-06 DIAGNOSIS — I2510 Atherosclerotic heart disease of native coronary artery without angina pectoris: Secondary | ICD-10-CM | POA: Diagnosis not present

## 2020-09-06 DIAGNOSIS — Z7901 Long term (current) use of anticoagulants: Secondary | ICD-10-CM | POA: Diagnosis not present

## 2020-09-06 DIAGNOSIS — E871 Hypo-osmolality and hyponatremia: Secondary | ICD-10-CM | POA: Diagnosis not present

## 2020-09-06 DIAGNOSIS — Z8701 Personal history of pneumonia (recurrent): Secondary | ICD-10-CM | POA: Diagnosis not present

## 2020-09-06 DIAGNOSIS — K219 Gastro-esophageal reflux disease without esophagitis: Secondary | ICD-10-CM | POA: Diagnosis not present

## 2020-09-06 DIAGNOSIS — R001 Bradycardia, unspecified: Secondary | ICD-10-CM | POA: Diagnosis not present

## 2020-09-06 DIAGNOSIS — I10 Essential (primary) hypertension: Secondary | ICD-10-CM | POA: Diagnosis not present

## 2020-09-06 DIAGNOSIS — Z48812 Encounter for surgical aftercare following surgery on the circulatory system: Secondary | ICD-10-CM | POA: Diagnosis not present

## 2020-09-06 DIAGNOSIS — I4811 Longstanding persistent atrial fibrillation: Secondary | ICD-10-CM | POA: Diagnosis not present

## 2020-09-11 DIAGNOSIS — Z7901 Long term (current) use of anticoagulants: Secondary | ICD-10-CM | POA: Diagnosis not present

## 2020-09-11 DIAGNOSIS — D62 Acute posthemorrhagic anemia: Secondary | ICD-10-CM | POA: Diagnosis not present

## 2020-09-11 DIAGNOSIS — N390 Urinary tract infection, site not specified: Secondary | ICD-10-CM | POA: Diagnosis not present

## 2020-09-11 DIAGNOSIS — W19XXXA Unspecified fall, initial encounter: Secondary | ICD-10-CM | POA: Diagnosis not present

## 2020-09-11 DIAGNOSIS — I6782 Cerebral ischemia: Secondary | ICD-10-CM | POA: Diagnosis not present

## 2020-09-11 DIAGNOSIS — G9389 Other specified disorders of brain: Secondary | ICD-10-CM | POA: Diagnosis not present

## 2020-09-11 DIAGNOSIS — M19011 Primary osteoarthritis, right shoulder: Secondary | ICD-10-CM | POA: Diagnosis not present

## 2020-09-11 DIAGNOSIS — S42009A Fracture of unspecified part of unspecified clavicle, initial encounter for closed fracture: Secondary | ICD-10-CM | POA: Diagnosis not present

## 2020-09-11 DIAGNOSIS — H9190 Unspecified hearing loss, unspecified ear: Secondary | ICD-10-CM | POA: Diagnosis not present

## 2020-09-11 DIAGNOSIS — N1832 Chronic kidney disease, stage 3b: Secondary | ICD-10-CM | POA: Diagnosis not present

## 2020-09-11 DIAGNOSIS — R5381 Other malaise: Secondary | ICD-10-CM | POA: Diagnosis not present

## 2020-09-11 DIAGNOSIS — S06320A Contusion and laceration of left cerebrum without loss of consciousness, initial encounter: Secondary | ICD-10-CM | POA: Diagnosis not present

## 2020-09-11 DIAGNOSIS — I2510 Atherosclerotic heart disease of native coronary artery without angina pectoris: Secondary | ICD-10-CM | POA: Diagnosis not present

## 2020-09-11 DIAGNOSIS — S42021A Displaced fracture of shaft of right clavicle, initial encounter for closed fracture: Secondary | ICD-10-CM | POA: Diagnosis not present

## 2020-09-11 DIAGNOSIS — Z950 Presence of cardiac pacemaker: Secondary | ICD-10-CM | POA: Diagnosis not present

## 2020-09-11 DIAGNOSIS — I700 Atherosclerosis of aorta: Secondary | ICD-10-CM | POA: Diagnosis not present

## 2020-09-11 DIAGNOSIS — M47812 Spondylosis without myelopathy or radiculopathy, cervical region: Secondary | ICD-10-CM | POA: Diagnosis not present

## 2020-09-11 DIAGNOSIS — S199XXA Unspecified injury of neck, initial encounter: Secondary | ICD-10-CM | POA: Diagnosis not present

## 2020-09-11 DIAGNOSIS — R58 Hemorrhage, not elsewhere classified: Secondary | ICD-10-CM | POA: Diagnosis not present

## 2020-09-11 DIAGNOSIS — S42001A Fracture of unspecified part of right clavicle, initial encounter for closed fracture: Secondary | ICD-10-CM | POA: Diagnosis not present

## 2020-09-11 DIAGNOSIS — S20211A Contusion of right front wall of thorax, initial encounter: Secondary | ICD-10-CM | POA: Diagnosis not present

## 2020-09-11 DIAGNOSIS — M2578 Osteophyte, vertebrae: Secondary | ICD-10-CM | POA: Diagnosis not present

## 2020-09-11 DIAGNOSIS — S20219A Contusion of unspecified front wall of thorax, initial encounter: Secondary | ICD-10-CM | POA: Diagnosis not present

## 2020-09-11 DIAGNOSIS — G319 Degenerative disease of nervous system, unspecified: Secondary | ICD-10-CM | POA: Diagnosis not present

## 2020-09-11 DIAGNOSIS — I517 Cardiomegaly: Secondary | ICD-10-CM | POA: Diagnosis not present

## 2020-09-11 DIAGNOSIS — S0101XA Laceration without foreign body of scalp, initial encounter: Secondary | ICD-10-CM | POA: Diagnosis not present

## 2020-09-12 DIAGNOSIS — N1832 Chronic kidney disease, stage 3b: Secondary | ICD-10-CM | POA: Diagnosis not present

## 2020-09-12 DIAGNOSIS — S20219A Contusion of unspecified front wall of thorax, initial encounter: Secondary | ICD-10-CM | POA: Diagnosis not present

## 2020-09-12 DIAGNOSIS — S42009A Fracture of unspecified part of unspecified clavicle, initial encounter for closed fracture: Secondary | ICD-10-CM | POA: Diagnosis not present

## 2020-09-13 DIAGNOSIS — S42009A Fracture of unspecified part of unspecified clavicle, initial encounter for closed fracture: Secondary | ICD-10-CM | POA: Diagnosis not present

## 2020-09-13 DIAGNOSIS — N1832 Chronic kidney disease, stage 3b: Secondary | ICD-10-CM | POA: Diagnosis not present

## 2020-09-13 DIAGNOSIS — S20219A Contusion of unspecified front wall of thorax, initial encounter: Secondary | ICD-10-CM | POA: Diagnosis not present

## 2020-09-14 DIAGNOSIS — N1832 Chronic kidney disease, stage 3b: Secondary | ICD-10-CM | POA: Diagnosis not present

## 2020-09-14 DIAGNOSIS — S20219A Contusion of unspecified front wall of thorax, initial encounter: Secondary | ICD-10-CM | POA: Diagnosis not present

## 2020-09-14 DIAGNOSIS — S42009A Fracture of unspecified part of unspecified clavicle, initial encounter for closed fracture: Secondary | ICD-10-CM | POA: Diagnosis not present

## 2020-09-15 DIAGNOSIS — S42009A Fracture of unspecified part of unspecified clavicle, initial encounter for closed fracture: Secondary | ICD-10-CM | POA: Diagnosis not present

## 2020-09-15 DIAGNOSIS — S20219A Contusion of unspecified front wall of thorax, initial encounter: Secondary | ICD-10-CM | POA: Diagnosis not present

## 2020-09-15 DIAGNOSIS — N1832 Chronic kidney disease, stage 3b: Secondary | ICD-10-CM | POA: Diagnosis not present

## 2020-09-17 DIAGNOSIS — N189 Chronic kidney disease, unspecified: Secondary | ICD-10-CM | POA: Diagnosis not present

## 2020-09-17 DIAGNOSIS — I495 Sick sinus syndrome: Secondary | ICD-10-CM | POA: Diagnosis not present

## 2020-09-17 DIAGNOSIS — R5381 Other malaise: Secondary | ICD-10-CM | POA: Diagnosis not present

## 2020-09-17 DIAGNOSIS — I2510 Atherosclerotic heart disease of native coronary artery without angina pectoris: Secondary | ICD-10-CM | POA: Diagnosis not present

## 2020-09-17 DIAGNOSIS — S42001A Fracture of unspecified part of right clavicle, initial encounter for closed fracture: Secondary | ICD-10-CM | POA: Diagnosis not present

## 2020-09-19 DIAGNOSIS — G729 Myopathy, unspecified: Secondary | ICD-10-CM | POA: Diagnosis not present

## 2020-09-19 DIAGNOSIS — R293 Abnormal posture: Secondary | ICD-10-CM | POA: Diagnosis not present

## 2020-09-19 DIAGNOSIS — S42001A Fracture of unspecified part of right clavicle, initial encounter for closed fracture: Secondary | ICD-10-CM | POA: Diagnosis not present

## 2020-09-19 DIAGNOSIS — R262 Difficulty in walking, not elsewhere classified: Secondary | ICD-10-CM | POA: Diagnosis not present

## 2020-09-19 DIAGNOSIS — S42021D Displaced fracture of shaft of right clavicle, subsequent encounter for fracture with routine healing: Secondary | ICD-10-CM | POA: Diagnosis not present

## 2020-09-19 DIAGNOSIS — I4891 Unspecified atrial fibrillation: Secondary | ICD-10-CM | POA: Diagnosis not present

## 2020-09-24 DIAGNOSIS — S42021D Displaced fracture of shaft of right clavicle, subsequent encounter for fracture with routine healing: Secondary | ICD-10-CM | POA: Diagnosis not present

## 2020-09-24 DIAGNOSIS — R262 Difficulty in walking, not elsewhere classified: Secondary | ICD-10-CM | POA: Diagnosis not present

## 2020-09-24 DIAGNOSIS — R293 Abnormal posture: Secondary | ICD-10-CM | POA: Diagnosis not present

## 2020-09-25 DIAGNOSIS — N189 Chronic kidney disease, unspecified: Secondary | ICD-10-CM | POA: Diagnosis not present

## 2020-09-25 DIAGNOSIS — I2510 Atherosclerotic heart disease of native coronary artery without angina pectoris: Secondary | ICD-10-CM | POA: Diagnosis not present

## 2020-09-25 DIAGNOSIS — S42021D Displaced fracture of shaft of right clavicle, subsequent encounter for fracture with routine healing: Secondary | ICD-10-CM | POA: Diagnosis not present

## 2020-09-25 DIAGNOSIS — I495 Sick sinus syndrome: Secondary | ICD-10-CM | POA: Diagnosis not present

## 2020-09-25 DIAGNOSIS — S42001A Fracture of unspecified part of right clavicle, initial encounter for closed fracture: Secondary | ICD-10-CM | POA: Diagnosis not present

## 2020-09-25 DIAGNOSIS — R5381 Other malaise: Secondary | ICD-10-CM | POA: Diagnosis not present

## 2020-09-25 DIAGNOSIS — R262 Difficulty in walking, not elsewhere classified: Secondary | ICD-10-CM | POA: Diagnosis not present

## 2020-09-25 DIAGNOSIS — R293 Abnormal posture: Secondary | ICD-10-CM | POA: Diagnosis not present

## 2020-09-26 DIAGNOSIS — R293 Abnormal posture: Secondary | ICD-10-CM | POA: Diagnosis not present

## 2020-09-26 DIAGNOSIS — S42021D Displaced fracture of shaft of right clavicle, subsequent encounter for fracture with routine healing: Secondary | ICD-10-CM | POA: Diagnosis not present

## 2020-09-26 DIAGNOSIS — R262 Difficulty in walking, not elsewhere classified: Secondary | ICD-10-CM | POA: Diagnosis not present

## 2020-09-27 DIAGNOSIS — R262 Difficulty in walking, not elsewhere classified: Secondary | ICD-10-CM | POA: Diagnosis not present

## 2020-09-27 DIAGNOSIS — R293 Abnormal posture: Secondary | ICD-10-CM | POA: Diagnosis not present

## 2020-09-27 DIAGNOSIS — S42021D Displaced fracture of shaft of right clavicle, subsequent encounter for fracture with routine healing: Secondary | ICD-10-CM | POA: Diagnosis not present

## 2020-10-01 DIAGNOSIS — R262 Difficulty in walking, not elsewhere classified: Secondary | ICD-10-CM | POA: Diagnosis not present

## 2020-10-01 DIAGNOSIS — S42021D Displaced fracture of shaft of right clavicle, subsequent encounter for fracture with routine healing: Secondary | ICD-10-CM | POA: Diagnosis not present

## 2020-10-01 DIAGNOSIS — R293 Abnormal posture: Secondary | ICD-10-CM | POA: Diagnosis not present

## 2020-10-02 DIAGNOSIS — R293 Abnormal posture: Secondary | ICD-10-CM | POA: Diagnosis not present

## 2020-10-02 DIAGNOSIS — N189 Chronic kidney disease, unspecified: Secondary | ICD-10-CM | POA: Diagnosis not present

## 2020-10-02 DIAGNOSIS — S42001A Fracture of unspecified part of right clavicle, initial encounter for closed fracture: Secondary | ICD-10-CM | POA: Diagnosis not present

## 2020-10-02 DIAGNOSIS — I2510 Atherosclerotic heart disease of native coronary artery without angina pectoris: Secondary | ICD-10-CM | POA: Diagnosis not present

## 2020-10-02 DIAGNOSIS — I495 Sick sinus syndrome: Secondary | ICD-10-CM | POA: Diagnosis not present

## 2020-10-02 DIAGNOSIS — R262 Difficulty in walking, not elsewhere classified: Secondary | ICD-10-CM | POA: Diagnosis not present

## 2020-10-02 DIAGNOSIS — S42021D Displaced fracture of shaft of right clavicle, subsequent encounter for fracture with routine healing: Secondary | ICD-10-CM | POA: Diagnosis not present

## 2020-10-02 DIAGNOSIS — R5381 Other malaise: Secondary | ICD-10-CM | POA: Diagnosis not present

## 2020-10-03 DIAGNOSIS — R293 Abnormal posture: Secondary | ICD-10-CM | POA: Diagnosis not present

## 2020-10-03 DIAGNOSIS — R262 Difficulty in walking, not elsewhere classified: Secondary | ICD-10-CM | POA: Diagnosis not present

## 2020-10-03 DIAGNOSIS — S42021D Displaced fracture of shaft of right clavicle, subsequent encounter for fracture with routine healing: Secondary | ICD-10-CM | POA: Diagnosis not present

## 2020-10-04 DIAGNOSIS — R293 Abnormal posture: Secondary | ICD-10-CM | POA: Diagnosis not present

## 2020-10-04 DIAGNOSIS — S42021D Displaced fracture of shaft of right clavicle, subsequent encounter for fracture with routine healing: Secondary | ICD-10-CM | POA: Diagnosis not present

## 2020-10-04 DIAGNOSIS — R262 Difficulty in walking, not elsewhere classified: Secondary | ICD-10-CM | POA: Diagnosis not present

## 2020-10-05 DIAGNOSIS — S42009A Fracture of unspecified part of unspecified clavicle, initial encounter for closed fracture: Secondary | ICD-10-CM | POA: Diagnosis not present

## 2020-10-06 DIAGNOSIS — I2510 Atherosclerotic heart disease of native coronary artery without angina pectoris: Secondary | ICD-10-CM | POA: Diagnosis not present

## 2020-10-06 DIAGNOSIS — J449 Chronic obstructive pulmonary disease, unspecified: Secondary | ICD-10-CM | POA: Diagnosis not present

## 2020-10-06 DIAGNOSIS — S72001D Fracture of unspecified part of neck of right femur, subsequent encounter for closed fracture with routine healing: Secondary | ICD-10-CM | POA: Diagnosis not present

## 2020-10-06 DIAGNOSIS — H9190 Unspecified hearing loss, unspecified ear: Secondary | ICD-10-CM | POA: Diagnosis not present

## 2020-10-06 DIAGNOSIS — Z951 Presence of aortocoronary bypass graft: Secondary | ICD-10-CM | POA: Diagnosis not present

## 2020-10-06 DIAGNOSIS — S42021D Displaced fracture of shaft of right clavicle, subsequent encounter for fracture with routine healing: Secondary | ICD-10-CM | POA: Diagnosis not present

## 2020-10-06 DIAGNOSIS — I4891 Unspecified atrial fibrillation: Secondary | ICD-10-CM | POA: Diagnosis not present

## 2020-10-06 DIAGNOSIS — N183 Chronic kidney disease, stage 3 unspecified: Secondary | ICD-10-CM | POA: Diagnosis not present

## 2020-10-06 DIAGNOSIS — I495 Sick sinus syndrome: Secondary | ICD-10-CM | POA: Diagnosis not present

## 2020-10-06 DIAGNOSIS — K219 Gastro-esophageal reflux disease without esophagitis: Secondary | ICD-10-CM | POA: Diagnosis not present

## 2020-10-09 ENCOUNTER — Other Ambulatory Visit: Payer: Self-pay

## 2020-10-09 DIAGNOSIS — I2510 Atherosclerotic heart disease of native coronary artery without angina pectoris: Secondary | ICD-10-CM | POA: Diagnosis not present

## 2020-10-09 DIAGNOSIS — J449 Chronic obstructive pulmonary disease, unspecified: Secondary | ICD-10-CM | POA: Diagnosis not present

## 2020-10-09 DIAGNOSIS — Z951 Presence of aortocoronary bypass graft: Secondary | ICD-10-CM | POA: Diagnosis not present

## 2020-10-09 DIAGNOSIS — I495 Sick sinus syndrome: Secondary | ICD-10-CM | POA: Diagnosis not present

## 2020-10-09 DIAGNOSIS — K219 Gastro-esophageal reflux disease without esophagitis: Secondary | ICD-10-CM | POA: Diagnosis not present

## 2020-10-09 DIAGNOSIS — S72001D Fracture of unspecified part of neck of right femur, subsequent encounter for closed fracture with routine healing: Secondary | ICD-10-CM | POA: Diagnosis not present

## 2020-10-09 DIAGNOSIS — H9190 Unspecified hearing loss, unspecified ear: Secondary | ICD-10-CM | POA: Diagnosis not present

## 2020-10-09 DIAGNOSIS — N183 Chronic kidney disease, stage 3 unspecified: Secondary | ICD-10-CM | POA: Diagnosis not present

## 2020-10-09 DIAGNOSIS — I4891 Unspecified atrial fibrillation: Secondary | ICD-10-CM | POA: Diagnosis not present

## 2020-10-09 NOTE — Patient Outreach (Signed)
[facility]) Care Management  10/09/2020  Cameron Willis 1934/02/10 [id number]     Transition of Care Referral  Referral Date: 10/09/2020 Referral Source: [facility] Discharge Report Date of Discharge: 10/05/2020 Facility: [place]: [facility]   Referral received. Transition of care calls being completed via EMMI-automated calls.      Plan: RN CM will close case at this time.   [name], RN,BSN,CCM [place] Management Telephonic Care Management Coordinator Direct Phone: [phone] Toll Free: [phone] Fax: [phone]

## 2020-10-10 DIAGNOSIS — I495 Sick sinus syndrome: Secondary | ICD-10-CM | POA: Diagnosis not present

## 2020-10-10 DIAGNOSIS — K219 Gastro-esophageal reflux disease without esophagitis: Secondary | ICD-10-CM | POA: Diagnosis not present

## 2020-10-10 DIAGNOSIS — N183 Chronic kidney disease, stage 3 unspecified: Secondary | ICD-10-CM | POA: Diagnosis not present

## 2020-10-10 DIAGNOSIS — J449 Chronic obstructive pulmonary disease, unspecified: Secondary | ICD-10-CM | POA: Diagnosis not present

## 2020-10-10 DIAGNOSIS — I2510 Atherosclerotic heart disease of native coronary artery without angina pectoris: Secondary | ICD-10-CM | POA: Diagnosis not present

## 2020-10-10 DIAGNOSIS — I4891 Unspecified atrial fibrillation: Secondary | ICD-10-CM | POA: Diagnosis not present

## 2020-10-10 DIAGNOSIS — Z951 Presence of aortocoronary bypass graft: Secondary | ICD-10-CM | POA: Diagnosis not present

## 2020-10-10 DIAGNOSIS — H9190 Unspecified hearing loss, unspecified ear: Secondary | ICD-10-CM | POA: Diagnosis not present

## 2020-10-10 DIAGNOSIS — S72001D Fracture of unspecified part of neck of right femur, subsequent encounter for closed fracture with routine healing: Secondary | ICD-10-CM | POA: Diagnosis not present

## 2020-10-12 DIAGNOSIS — H9190 Unspecified hearing loss, unspecified ear: Secondary | ICD-10-CM | POA: Diagnosis not present

## 2020-10-12 DIAGNOSIS — I4891 Unspecified atrial fibrillation: Secondary | ICD-10-CM | POA: Diagnosis not present

## 2020-10-12 DIAGNOSIS — S72001D Fracture of unspecified part of neck of right femur, subsequent encounter for closed fracture with routine healing: Secondary | ICD-10-CM | POA: Diagnosis not present

## 2020-10-12 DIAGNOSIS — N183 Chronic kidney disease, stage 3 unspecified: Secondary | ICD-10-CM | POA: Diagnosis not present

## 2020-10-12 DIAGNOSIS — I2510 Atherosclerotic heart disease of native coronary artery without angina pectoris: Secondary | ICD-10-CM | POA: Diagnosis not present

## 2020-10-12 DIAGNOSIS — Z951 Presence of aortocoronary bypass graft: Secondary | ICD-10-CM | POA: Diagnosis not present

## 2020-10-12 DIAGNOSIS — J449 Chronic obstructive pulmonary disease, unspecified: Secondary | ICD-10-CM | POA: Diagnosis not present

## 2020-10-12 DIAGNOSIS — K219 Gastro-esophageal reflux disease without esophagitis: Secondary | ICD-10-CM | POA: Diagnosis not present

## 2020-10-12 DIAGNOSIS — I495 Sick sinus syndrome: Secondary | ICD-10-CM | POA: Diagnosis not present

## 2020-10-15 DIAGNOSIS — I4891 Unspecified atrial fibrillation: Secondary | ICD-10-CM | POA: Diagnosis not present

## 2020-10-15 DIAGNOSIS — I495 Sick sinus syndrome: Secondary | ICD-10-CM | POA: Diagnosis not present

## 2020-10-15 DIAGNOSIS — N183 Chronic kidney disease, stage 3 unspecified: Secondary | ICD-10-CM | POA: Diagnosis not present

## 2020-10-15 DIAGNOSIS — K219 Gastro-esophageal reflux disease without esophagitis: Secondary | ICD-10-CM | POA: Diagnosis not present

## 2020-10-15 DIAGNOSIS — S72001D Fracture of unspecified part of neck of right femur, subsequent encounter for closed fracture with routine healing: Secondary | ICD-10-CM | POA: Diagnosis not present

## 2020-10-15 DIAGNOSIS — Z951 Presence of aortocoronary bypass graft: Secondary | ICD-10-CM | POA: Diagnosis not present

## 2020-10-15 DIAGNOSIS — J449 Chronic obstructive pulmonary disease, unspecified: Secondary | ICD-10-CM | POA: Diagnosis not present

## 2020-10-15 DIAGNOSIS — H9190 Unspecified hearing loss, unspecified ear: Secondary | ICD-10-CM | POA: Diagnosis not present

## 2020-10-15 DIAGNOSIS — I2510 Atherosclerotic heart disease of native coronary artery without angina pectoris: Secondary | ICD-10-CM | POA: Diagnosis not present

## 2020-10-16 DIAGNOSIS — S72001D Fracture of unspecified part of neck of right femur, subsequent encounter for closed fracture with routine healing: Secondary | ICD-10-CM | POA: Diagnosis not present

## 2020-10-16 DIAGNOSIS — Z951 Presence of aortocoronary bypass graft: Secondary | ICD-10-CM | POA: Diagnosis not present

## 2020-10-16 DIAGNOSIS — J449 Chronic obstructive pulmonary disease, unspecified: Secondary | ICD-10-CM | POA: Diagnosis not present

## 2020-10-16 DIAGNOSIS — N183 Chronic kidney disease, stage 3 unspecified: Secondary | ICD-10-CM | POA: Diagnosis not present

## 2020-10-16 DIAGNOSIS — I495 Sick sinus syndrome: Secondary | ICD-10-CM | POA: Diagnosis not present

## 2020-10-16 DIAGNOSIS — I2510 Atherosclerotic heart disease of native coronary artery without angina pectoris: Secondary | ICD-10-CM | POA: Diagnosis not present

## 2020-10-16 DIAGNOSIS — K219 Gastro-esophageal reflux disease without esophagitis: Secondary | ICD-10-CM | POA: Diagnosis not present

## 2020-10-16 DIAGNOSIS — H9190 Unspecified hearing loss, unspecified ear: Secondary | ICD-10-CM | POA: Diagnosis not present

## 2020-10-16 DIAGNOSIS — I4891 Unspecified atrial fibrillation: Secondary | ICD-10-CM | POA: Diagnosis not present

## 2020-10-16 DIAGNOSIS — Z7901 Long term (current) use of anticoagulants: Secondary | ICD-10-CM | POA: Diagnosis not present

## 2020-10-17 DIAGNOSIS — L89310 Pressure ulcer of right buttock, unstageable: Secondary | ICD-10-CM | POA: Diagnosis not present

## 2020-10-17 DIAGNOSIS — L89620 Pressure ulcer of left heel, unstageable: Secondary | ICD-10-CM | POA: Diagnosis not present

## 2020-10-17 DIAGNOSIS — M80052D Age-related osteoporosis with current pathological fracture, left femur, subsequent encounter for fracture with routine healing: Secondary | ICD-10-CM | POA: Diagnosis not present

## 2020-10-18 DIAGNOSIS — I4891 Unspecified atrial fibrillation: Secondary | ICD-10-CM | POA: Diagnosis not present

## 2020-10-18 DIAGNOSIS — J449 Chronic obstructive pulmonary disease, unspecified: Secondary | ICD-10-CM | POA: Diagnosis not present

## 2020-10-18 DIAGNOSIS — H9190 Unspecified hearing loss, unspecified ear: Secondary | ICD-10-CM | POA: Diagnosis not present

## 2020-10-18 DIAGNOSIS — I2510 Atherosclerotic heart disease of native coronary artery without angina pectoris: Secondary | ICD-10-CM | POA: Diagnosis not present

## 2020-10-18 DIAGNOSIS — I495 Sick sinus syndrome: Secondary | ICD-10-CM | POA: Diagnosis not present

## 2020-10-18 DIAGNOSIS — S72001D Fracture of unspecified part of neck of right femur, subsequent encounter for closed fracture with routine healing: Secondary | ICD-10-CM | POA: Diagnosis not present

## 2020-10-18 DIAGNOSIS — Z951 Presence of aortocoronary bypass graft: Secondary | ICD-10-CM | POA: Diagnosis not present

## 2020-10-18 DIAGNOSIS — K219 Gastro-esophageal reflux disease without esophagitis: Secondary | ICD-10-CM | POA: Diagnosis not present

## 2020-10-18 DIAGNOSIS — N183 Chronic kidney disease, stage 3 unspecified: Secondary | ICD-10-CM | POA: Diagnosis not present

## 2020-10-22 DIAGNOSIS — S72001D Fracture of unspecified part of neck of right femur, subsequent encounter for closed fracture with routine healing: Secondary | ICD-10-CM | POA: Diagnosis not present

## 2020-10-22 DIAGNOSIS — I2510 Atherosclerotic heart disease of native coronary artery without angina pectoris: Secondary | ICD-10-CM | POA: Diagnosis not present

## 2020-10-22 DIAGNOSIS — I4891 Unspecified atrial fibrillation: Secondary | ICD-10-CM | POA: Diagnosis not present

## 2020-10-22 DIAGNOSIS — N183 Chronic kidney disease, stage 3 unspecified: Secondary | ICD-10-CM | POA: Diagnosis not present

## 2020-10-22 DIAGNOSIS — H9190 Unspecified hearing loss, unspecified ear: Secondary | ICD-10-CM | POA: Diagnosis not present

## 2020-10-22 DIAGNOSIS — K219 Gastro-esophageal reflux disease without esophagitis: Secondary | ICD-10-CM | POA: Diagnosis not present

## 2020-10-22 DIAGNOSIS — I495 Sick sinus syndrome: Secondary | ICD-10-CM | POA: Diagnosis not present

## 2020-10-22 DIAGNOSIS — J449 Chronic obstructive pulmonary disease, unspecified: Secondary | ICD-10-CM | POA: Diagnosis not present

## 2020-10-22 DIAGNOSIS — Z951 Presence of aortocoronary bypass graft: Secondary | ICD-10-CM | POA: Diagnosis not present

## 2020-10-23 DIAGNOSIS — I495 Sick sinus syndrome: Secondary | ICD-10-CM | POA: Diagnosis not present

## 2020-10-23 DIAGNOSIS — I4891 Unspecified atrial fibrillation: Secondary | ICD-10-CM | POA: Diagnosis not present

## 2020-10-23 DIAGNOSIS — H9190 Unspecified hearing loss, unspecified ear: Secondary | ICD-10-CM | POA: Diagnosis not present

## 2020-10-23 DIAGNOSIS — S72001D Fracture of unspecified part of neck of right femur, subsequent encounter for closed fracture with routine healing: Secondary | ICD-10-CM | POA: Diagnosis not present

## 2020-10-23 DIAGNOSIS — I2510 Atherosclerotic heart disease of native coronary artery without angina pectoris: Secondary | ICD-10-CM | POA: Diagnosis not present

## 2020-10-23 DIAGNOSIS — K219 Gastro-esophageal reflux disease without esophagitis: Secondary | ICD-10-CM | POA: Diagnosis not present

## 2020-10-23 DIAGNOSIS — J449 Chronic obstructive pulmonary disease, unspecified: Secondary | ICD-10-CM | POA: Diagnosis not present

## 2020-10-23 DIAGNOSIS — N183 Chronic kidney disease, stage 3 unspecified: Secondary | ICD-10-CM | POA: Diagnosis not present

## 2020-10-23 DIAGNOSIS — Z951 Presence of aortocoronary bypass graft: Secondary | ICD-10-CM | POA: Diagnosis not present

## 2020-10-24 DIAGNOSIS — Z951 Presence of aortocoronary bypass graft: Secondary | ICD-10-CM | POA: Diagnosis not present

## 2020-10-24 DIAGNOSIS — I495 Sick sinus syndrome: Secondary | ICD-10-CM | POA: Diagnosis not present

## 2020-10-24 DIAGNOSIS — H9190 Unspecified hearing loss, unspecified ear: Secondary | ICD-10-CM | POA: Diagnosis not present

## 2020-10-24 DIAGNOSIS — S72001D Fracture of unspecified part of neck of right femur, subsequent encounter for closed fracture with routine healing: Secondary | ICD-10-CM | POA: Diagnosis not present

## 2020-10-24 DIAGNOSIS — K219 Gastro-esophageal reflux disease without esophagitis: Secondary | ICD-10-CM | POA: Diagnosis not present

## 2020-10-24 DIAGNOSIS — N183 Chronic kidney disease, stage 3 unspecified: Secondary | ICD-10-CM | POA: Diagnosis not present

## 2020-10-24 DIAGNOSIS — J449 Chronic obstructive pulmonary disease, unspecified: Secondary | ICD-10-CM | POA: Diagnosis not present

## 2020-10-24 DIAGNOSIS — I4891 Unspecified atrial fibrillation: Secondary | ICD-10-CM | POA: Diagnosis not present

## 2020-10-24 DIAGNOSIS — I2510 Atherosclerotic heart disease of native coronary artery without angina pectoris: Secondary | ICD-10-CM | POA: Diagnosis not present

## 2020-10-26 DIAGNOSIS — N183 Chronic kidney disease, stage 3 unspecified: Secondary | ICD-10-CM | POA: Diagnosis not present

## 2020-10-26 DIAGNOSIS — I4891 Unspecified atrial fibrillation: Secondary | ICD-10-CM | POA: Diagnosis not present

## 2020-10-26 DIAGNOSIS — K219 Gastro-esophageal reflux disease without esophagitis: Secondary | ICD-10-CM | POA: Diagnosis not present

## 2020-10-26 DIAGNOSIS — J449 Chronic obstructive pulmonary disease, unspecified: Secondary | ICD-10-CM | POA: Diagnosis not present

## 2020-10-26 DIAGNOSIS — I495 Sick sinus syndrome: Secondary | ICD-10-CM | POA: Diagnosis not present

## 2020-10-26 DIAGNOSIS — S72001D Fracture of unspecified part of neck of right femur, subsequent encounter for closed fracture with routine healing: Secondary | ICD-10-CM | POA: Diagnosis not present

## 2020-10-26 DIAGNOSIS — I2510 Atherosclerotic heart disease of native coronary artery without angina pectoris: Secondary | ICD-10-CM | POA: Diagnosis not present

## 2020-10-26 DIAGNOSIS — Z951 Presence of aortocoronary bypass graft: Secondary | ICD-10-CM | POA: Diagnosis not present

## 2020-10-26 DIAGNOSIS — H9190 Unspecified hearing loss, unspecified ear: Secondary | ICD-10-CM | POA: Diagnosis not present

## 2020-10-30 DIAGNOSIS — H9190 Unspecified hearing loss, unspecified ear: Secondary | ICD-10-CM | POA: Diagnosis not present

## 2020-10-30 DIAGNOSIS — J449 Chronic obstructive pulmonary disease, unspecified: Secondary | ICD-10-CM | POA: Diagnosis not present

## 2020-10-30 DIAGNOSIS — Z0100 Encounter for examination of eyes and vision without abnormal findings: Secondary | ICD-10-CM | POA: Diagnosis not present

## 2020-10-30 DIAGNOSIS — K219 Gastro-esophageal reflux disease without esophagitis: Secondary | ICD-10-CM | POA: Diagnosis not present

## 2020-10-30 DIAGNOSIS — N183 Chronic kidney disease, stage 3 unspecified: Secondary | ICD-10-CM | POA: Diagnosis not present

## 2020-10-30 DIAGNOSIS — I495 Sick sinus syndrome: Secondary | ICD-10-CM | POA: Diagnosis not present

## 2020-10-30 DIAGNOSIS — Z951 Presence of aortocoronary bypass graft: Secondary | ICD-10-CM | POA: Diagnosis not present

## 2020-10-30 DIAGNOSIS — S72001D Fracture of unspecified part of neck of right femur, subsequent encounter for closed fracture with routine healing: Secondary | ICD-10-CM | POA: Diagnosis not present

## 2020-10-30 DIAGNOSIS — I2510 Atherosclerotic heart disease of native coronary artery without angina pectoris: Secondary | ICD-10-CM | POA: Diagnosis not present

## 2020-10-30 DIAGNOSIS — I4891 Unspecified atrial fibrillation: Secondary | ICD-10-CM | POA: Diagnosis not present

## 2020-10-30 DIAGNOSIS — H52223 Regular astigmatism, bilateral: Secondary | ICD-10-CM | POA: Diagnosis not present

## 2020-10-31 ENCOUNTER — Ambulatory Visit (INDEPENDENT_AMBULATORY_CARE_PROVIDER_SITE_OTHER): Payer: Medicare HMO

## 2020-10-31 DIAGNOSIS — I2510 Atherosclerotic heart disease of native coronary artery without angina pectoris: Secondary | ICD-10-CM | POA: Diagnosis not present

## 2020-10-31 DIAGNOSIS — I495 Sick sinus syndrome: Secondary | ICD-10-CM

## 2020-10-31 DIAGNOSIS — I4891 Unspecified atrial fibrillation: Secondary | ICD-10-CM | POA: Diagnosis not present

## 2020-10-31 DIAGNOSIS — K219 Gastro-esophageal reflux disease without esophagitis: Secondary | ICD-10-CM | POA: Diagnosis not present

## 2020-10-31 DIAGNOSIS — J449 Chronic obstructive pulmonary disease, unspecified: Secondary | ICD-10-CM | POA: Diagnosis not present

## 2020-10-31 DIAGNOSIS — S72001D Fracture of unspecified part of neck of right femur, subsequent encounter for closed fracture with routine healing: Secondary | ICD-10-CM | POA: Diagnosis not present

## 2020-10-31 DIAGNOSIS — H9190 Unspecified hearing loss, unspecified ear: Secondary | ICD-10-CM | POA: Diagnosis not present

## 2020-10-31 DIAGNOSIS — Z951 Presence of aortocoronary bypass graft: Secondary | ICD-10-CM | POA: Diagnosis not present

## 2020-10-31 DIAGNOSIS — N183 Chronic kidney disease, stage 3 unspecified: Secondary | ICD-10-CM | POA: Diagnosis not present

## 2020-10-31 LAB — CUP PACEART REMOTE DEVICE CHECK
Battery Remaining Longevity: 153 mo
Battery Voltage: 3.19 V
Brady Statistic RV Percent Paced: 99.87 %
Date Time Interrogation Session: 20220330031749
Implantable Lead Implant Date: 20211227
Implantable Lead Location: 753860
Implantable Lead Model: 3830
Implantable Pulse Generator Implant Date: 20211227
Lead Channel Impedance Value: 475 Ohm
Lead Channel Impedance Value: 608 Ohm
Lead Channel Pacing Threshold Amplitude: 0.5 V
Lead Channel Pacing Threshold Pulse Width: 0.4 ms
Lead Channel Sensing Intrinsic Amplitude: 18.75 mV
Lead Channel Sensing Intrinsic Amplitude: 18.75 mV
Lead Channel Setting Pacing Amplitude: 3.5 V
Lead Channel Setting Pacing Pulse Width: 0.4 ms
Lead Channel Setting Sensing Sensitivity: 0.9 mV

## 2020-11-01 DIAGNOSIS — Z951 Presence of aortocoronary bypass graft: Secondary | ICD-10-CM | POA: Diagnosis not present

## 2020-11-01 DIAGNOSIS — K219 Gastro-esophageal reflux disease without esophagitis: Secondary | ICD-10-CM | POA: Diagnosis not present

## 2020-11-01 DIAGNOSIS — I2510 Atherosclerotic heart disease of native coronary artery without angina pectoris: Secondary | ICD-10-CM | POA: Diagnosis not present

## 2020-11-01 DIAGNOSIS — S72001D Fracture of unspecified part of neck of right femur, subsequent encounter for closed fracture with routine healing: Secondary | ICD-10-CM | POA: Diagnosis not present

## 2020-11-01 DIAGNOSIS — I495 Sick sinus syndrome: Secondary | ICD-10-CM | POA: Diagnosis not present

## 2020-11-01 DIAGNOSIS — I4891 Unspecified atrial fibrillation: Secondary | ICD-10-CM | POA: Diagnosis not present

## 2020-11-01 DIAGNOSIS — H9190 Unspecified hearing loss, unspecified ear: Secondary | ICD-10-CM | POA: Diagnosis not present

## 2020-11-01 DIAGNOSIS — N183 Chronic kidney disease, stage 3 unspecified: Secondary | ICD-10-CM | POA: Diagnosis not present

## 2020-11-01 DIAGNOSIS — J449 Chronic obstructive pulmonary disease, unspecified: Secondary | ICD-10-CM | POA: Diagnosis not present

## 2020-11-02 DIAGNOSIS — I495 Sick sinus syndrome: Secondary | ICD-10-CM | POA: Diagnosis not present

## 2020-11-02 DIAGNOSIS — J449 Chronic obstructive pulmonary disease, unspecified: Secondary | ICD-10-CM | POA: Diagnosis not present

## 2020-11-02 DIAGNOSIS — I2510 Atherosclerotic heart disease of native coronary artery without angina pectoris: Secondary | ICD-10-CM | POA: Diagnosis not present

## 2020-11-02 DIAGNOSIS — H9190 Unspecified hearing loss, unspecified ear: Secondary | ICD-10-CM | POA: Diagnosis not present

## 2020-11-02 DIAGNOSIS — Z951 Presence of aortocoronary bypass graft: Secondary | ICD-10-CM | POA: Diagnosis not present

## 2020-11-02 DIAGNOSIS — K219 Gastro-esophageal reflux disease without esophagitis: Secondary | ICD-10-CM | POA: Diagnosis not present

## 2020-11-02 DIAGNOSIS — N183 Chronic kidney disease, stage 3 unspecified: Secondary | ICD-10-CM | POA: Diagnosis not present

## 2020-11-02 DIAGNOSIS — S72001D Fracture of unspecified part of neck of right femur, subsequent encounter for closed fracture with routine healing: Secondary | ICD-10-CM | POA: Diagnosis not present

## 2020-11-02 DIAGNOSIS — I4891 Unspecified atrial fibrillation: Secondary | ICD-10-CM | POA: Diagnosis not present

## 2020-11-05 ENCOUNTER — Ambulatory Visit (INDEPENDENT_AMBULATORY_CARE_PROVIDER_SITE_OTHER): Payer: Medicare HMO | Admitting: Cardiology

## 2020-11-05 ENCOUNTER — Other Ambulatory Visit: Payer: Self-pay

## 2020-11-05 ENCOUNTER — Encounter: Payer: Self-pay | Admitting: Cardiology

## 2020-11-05 VITALS — BP 118/68 | HR 67 | Ht 71.0 in

## 2020-11-05 DIAGNOSIS — J449 Chronic obstructive pulmonary disease, unspecified: Secondary | ICD-10-CM | POA: Diagnosis not present

## 2020-11-05 DIAGNOSIS — K219 Gastro-esophageal reflux disease without esophagitis: Secondary | ICD-10-CM | POA: Diagnosis not present

## 2020-11-05 DIAGNOSIS — I442 Atrioventricular block, complete: Secondary | ICD-10-CM | POA: Diagnosis not present

## 2020-11-05 DIAGNOSIS — Z951 Presence of aortocoronary bypass graft: Secondary | ICD-10-CM | POA: Diagnosis not present

## 2020-11-05 DIAGNOSIS — S72001D Fracture of unspecified part of neck of right femur, subsequent encounter for closed fracture with routine healing: Secondary | ICD-10-CM | POA: Diagnosis not present

## 2020-11-05 DIAGNOSIS — I2510 Atherosclerotic heart disease of native coronary artery without angina pectoris: Secondary | ICD-10-CM | POA: Diagnosis not present

## 2020-11-05 DIAGNOSIS — I495 Sick sinus syndrome: Secondary | ICD-10-CM | POA: Diagnosis not present

## 2020-11-05 DIAGNOSIS — H9190 Unspecified hearing loss, unspecified ear: Secondary | ICD-10-CM | POA: Diagnosis not present

## 2020-11-05 DIAGNOSIS — N183 Chronic kidney disease, stage 3 unspecified: Secondary | ICD-10-CM | POA: Diagnosis not present

## 2020-11-05 DIAGNOSIS — I4891 Unspecified atrial fibrillation: Secondary | ICD-10-CM | POA: Diagnosis not present

## 2020-11-05 NOTE — Progress Notes (Signed)
Electrophysiology Office Note   Date:  11/05/2020   ID:  Cameron Willis, DOB 02/09/1934, MRN [id number]  PCP:  Cameron Roger, MD  Cardiologist:  Cameron Willis Primary Electrophysiologist:  Cameron Willis Cameron Leeds, MD    Chief Complaint: pacemaker   History of Present Illness: Cameron Willis is a 87 y.o. male who is being seen today for the evaluation of heart block, pacemaker at the request of Cameron Roger, MD. Presenting today for electrophysiology evaluation.  He has a history significant for atrial fibrillation, COPD, coronary artery disease status post CABG, hyperlipidemia, hypertension, tobacco abuse.  He presented to the hospital December 2021 with chest pain, was found to have intermittent complete heart block with underlying longstanding persistent atrial fibrillation.  He is now status post Medtronic pacemaker on 07/30/2020.  Today, he denies symptoms of palpitations, chest pain, shortness of breath, orthopnea, PND, lower extremity edema, claudication, dizziness, presyncope, syncope, bleeding, or neurologic sequela. The patient is tolerating medications without difficulties.    Past Medical History:  Diagnosis Date  . Anxiety and depression   . Bradycardia by electrocardiogram 01/20/2018  . Cervical disc disorder at C4-C5 level with myelopathy 12/05/2016  . Chronic atrial fibrillation ([place]) 03/01/2018  . Contracture of right knee 12/05/2016  . COPD (chronic obstructive pulmonary disease) ([place]) 03/22/2012  . Coronary artery disease involving native coronary artery of native heart without angina pectoris 03/22/2012   Overview:  Coronary artery bypass graft August 2013 LIMA to LAD and SVG to RCA and SVG to obtuse marginal  . Dyslipidemia 03/26/2015  . Esophageal stenosis   . Gait disturbance 10/14/2017  . GERD (gastroesophageal reflux disease) 03/22/2012  . Hemorrhoids   . History of degenerative disc disease   . HTN (hypertension) 03/22/2012  . Hypertrophy of prostate   . MI, old 03/22/2012  .  NSVT (nonsustained ventricular tachycardia) ([place]) 04/05/2020  . Osteoarthrosis   . Paroxysmal atrial fibrillation ([place]) 03/26/2015   Overview:  Chads score 2, was anticoagulated but developed tarry stool optic admission was withdrawn for now scheduled to see gastroenterologist  . Persistent atrial fibrillation ([place]) 01/20/2018  . Pre-op evaluation 03/25/2016  . S/P CABG x 3 03/25/2012  . Tobacco abuse 03/22/2012   Overview:  Quit 2 years ago   Past Surgical History:  Procedure Laterality Date  . CARDIAC CATHETERIZATION    . COLONOSCOPY  05/07/2015   Colonic polyps status post polypectomy. Internal hemorrhoids ( likely etiology of rectal bleeding)   . CORONARY ARTERY BYPASS GRAFT    . ESOPHAGOGASTRODUODENOSCOPY  11/06/2005   Smal hiatal hernia. Moderate gastritis  . [place]  . PACEMAKER IMPLANT N/A 07/30/2020   Procedure: PACEMAKER IMPLANT;  Surgeon: Evans Lance, MD;  Location: [facility];  Service: Cardiovascular;  Laterality: N/A;     Current Outpatient Medications  Medication Sig Dispense Refill  . acetaminophen (TYLENOL) 325 MG tablet Take 650 mg by mouth daily.    Marland Kitchen amiodarone (PACERONE) 200 MG tablet Take 200 mg per one tablet by mouth twice daily for three days. Then take 200 mg per one tablet by mouth once daily. 90 tablet 3  . amLODipine (NORVASC) 5 MG tablet Take 5 mg by mouth daily.    Marland Kitchen CALCIUM PO Take 1 tablet by mouth daily.    . celecoxib (CELEBREX) 200 MG capsule Take 200 mg by mouth daily.    . cholecalciferol (VITAMIN D3) 25 MCG (1000 UNIT) tablet Take 1,000 Units by mouth daily.    Marland Kitchen  ELIQUIS 5 MG TABS tablet Take 1 tablet (5 mg total) by mouth 2 (two) times daily. Resume Saturday with the evening dose. 90 tablet 3  . meclizine (ANTIVERT) 12.5 MG tablet Take 1 tablet by mouth as needed for dizziness.    . nitroGLYCERIN (NITROSTAT) 0.4 MG SL tablet Place 1 tablet (0.4 mg total) under the tongue as needed for chest pain. 30 tablet 6  .  pantoprazole (PROTONIX) 40 MG tablet Take 40 mg by mouth daily.     No current facility-administered medications for this visit.    Allergies:   Aciphex [rabeprazole sodium], Lipitor [atorvastatin], Omeprazole, and Pravachol [pravastatin]   Social History:  The patient  reports that he has quit smoking. He has never used smokeless tobacco. He reports that he does not drink alcohol and does not use drugs.   Family History:  The patient's family history includes Cancer in his father; Esophageal cancer in his father.   ROS:  Please see the history of present illness.   Otherwise, review of systems is positive for none.   All other systems are reviewed and negative.   PHYSICAL EXAM: VS:  BP 118/68   Pulse 67   Ht 5\' 11"  (1.803 m)   SpO2 97%   BMI 21.57 kg/m  , BMI Body mass index is 21.57 kg/m. GEN: Well nourished, well developed, in no acute distress  HEENT: normal  Neck: no JVD, carotid bruits, or masses Cardiac: RRR; no murmurs, rubs, or gallops,no edema  Respiratory:  clear to auscultation bilaterally, normal work of breathing GI: soft, nontender, nondistended, + BS MS: no deformity or atrophy  Skin: warm and dry, device site well healed Neuro:  Strength and sensation are intact Psych: euthymic mood, full affect  EKG:  EKG is ordered today. Personal review of the ekg ordered shows perfect atrial fibrillation, ventricular paced  Personal review of the device interrogation today. Results in [place]: 04/05/2020: ALT 8 05/03/2020: Magnesium 2.2 07/28/2020: B Natriuretic Peptide 282.4; TSH 1.689 07/31/2020: BUN 22; Creatinine, Ser 1.40; Hemoglobin 14.7; Platelets 210; Potassium 4.9; Sodium 134    Lipid Panel  No results found for: CHOL, TRIG, HDL, CHOLHDL, VLDL, LDLCALC, LDLDIRECT   Wt Readings from Last 3 Encounters:  07/31/20 154 lb 10.9 oz (70.2 kg)  05/03/20 170 lb (77.1 kg)  04/05/20 179 lb (81.2 kg)      Other studies Reviewed: Additional studies/  records that were reviewed today include: TTE 07/29/20  Review of the above records today demonstrates:  1. This was a limited study for evaluation of LVEF that is normal  estimated at 60-65% and unchanged from prior study on 03/15/2020.  2. Left ventricular ejection fraction, by estimation, is 60 to 65%. The  left ventricle has normal function. The left ventricle has no regional  wall motion abnormalities. Left ventricular diastolic function could not  be evaluated.  3. Right ventricular systolic function is normal. The right ventricular  size is normal.  4. Left atrial size was mildly dilated.  5. The mitral valve is grossly normal.  6. The aortic valve is normal in structure.    ASSESSMENT AND PLAN:  1.  Complete heart block: Status post Medtronic dual-chamber pacemaker implanted 07/30/2020.  Device functioning appropriately.  No changes at this time.  2.  Permanent atrial fibrillation: Currently on Eliquis with a CHA2DS2-VASc of at least 4.  3.  Hypertension:well controlled  4.  Coronary artery disease status post CABG: No current chest pain.  Plan  per primary cardiology.  5.  Nonsustained VT: Currently on amiodarone.  Current medicines are reviewed at length with the patient today.   The patient does not have concerns regarding his medicines.  The following changes were made today:  none  Labs/ tests ordered today include:  Orders Placed This Encounter  Procedures  . EKG 12-Lead     Disposition:   FU with Cameron Willis 9 months  Signed, Adi Seales Cameron Leeds, MD  11/05/2020 12:05 PM     [facility] [address] [place] [address] [phone] (office) [phone] (fax)

## 2020-11-06 ENCOUNTER — Encounter: Payer: Medicare HMO | Admitting: Internal Medicine

## 2020-11-06 DIAGNOSIS — K219 Gastro-esophageal reflux disease without esophagitis: Secondary | ICD-10-CM | POA: Diagnosis not present

## 2020-11-06 DIAGNOSIS — N183 Chronic kidney disease, stage 3 unspecified: Secondary | ICD-10-CM | POA: Diagnosis not present

## 2020-11-06 DIAGNOSIS — H9190 Unspecified hearing loss, unspecified ear: Secondary | ICD-10-CM | POA: Diagnosis not present

## 2020-11-06 DIAGNOSIS — J449 Chronic obstructive pulmonary disease, unspecified: Secondary | ICD-10-CM | POA: Diagnosis not present

## 2020-11-06 DIAGNOSIS — Z951 Presence of aortocoronary bypass graft: Secondary | ICD-10-CM | POA: Diagnosis not present

## 2020-11-06 DIAGNOSIS — I2510 Atherosclerotic heart disease of native coronary artery without angina pectoris: Secondary | ICD-10-CM | POA: Diagnosis not present

## 2020-11-06 DIAGNOSIS — S72001D Fracture of unspecified part of neck of right femur, subsequent encounter for closed fracture with routine healing: Secondary | ICD-10-CM | POA: Diagnosis not present

## 2020-11-06 DIAGNOSIS — I4891 Unspecified atrial fibrillation: Secondary | ICD-10-CM | POA: Diagnosis not present

## 2020-11-06 DIAGNOSIS — I495 Sick sinus syndrome: Secondary | ICD-10-CM | POA: Diagnosis not present

## 2020-11-07 DIAGNOSIS — I495 Sick sinus syndrome: Secondary | ICD-10-CM | POA: Diagnosis not present

## 2020-11-07 DIAGNOSIS — Z951 Presence of aortocoronary bypass graft: Secondary | ICD-10-CM | POA: Diagnosis not present

## 2020-11-07 DIAGNOSIS — J449 Chronic obstructive pulmonary disease, unspecified: Secondary | ICD-10-CM | POA: Diagnosis not present

## 2020-11-07 DIAGNOSIS — K219 Gastro-esophageal reflux disease without esophagitis: Secondary | ICD-10-CM | POA: Diagnosis not present

## 2020-11-07 DIAGNOSIS — H9190 Unspecified hearing loss, unspecified ear: Secondary | ICD-10-CM | POA: Diagnosis not present

## 2020-11-07 DIAGNOSIS — I4891 Unspecified atrial fibrillation: Secondary | ICD-10-CM | POA: Diagnosis not present

## 2020-11-07 DIAGNOSIS — N183 Chronic kidney disease, stage 3 unspecified: Secondary | ICD-10-CM | POA: Diagnosis not present

## 2020-11-07 DIAGNOSIS — S72001D Fracture of unspecified part of neck of right femur, subsequent encounter for closed fracture with routine healing: Secondary | ICD-10-CM | POA: Diagnosis not present

## 2020-11-07 DIAGNOSIS — I2510 Atherosclerotic heart disease of native coronary artery without angina pectoris: Secondary | ICD-10-CM | POA: Diagnosis not present

## 2020-11-09 DIAGNOSIS — H9190 Unspecified hearing loss, unspecified ear: Secondary | ICD-10-CM | POA: Diagnosis not present

## 2020-11-09 DIAGNOSIS — I495 Sick sinus syndrome: Secondary | ICD-10-CM | POA: Diagnosis not present

## 2020-11-09 DIAGNOSIS — J449 Chronic obstructive pulmonary disease, unspecified: Secondary | ICD-10-CM | POA: Diagnosis not present

## 2020-11-09 DIAGNOSIS — K219 Gastro-esophageal reflux disease without esophagitis: Secondary | ICD-10-CM | POA: Diagnosis not present

## 2020-11-09 DIAGNOSIS — N183 Chronic kidney disease, stage 3 unspecified: Secondary | ICD-10-CM | POA: Diagnosis not present

## 2020-11-09 DIAGNOSIS — I2510 Atherosclerotic heart disease of native coronary artery without angina pectoris: Secondary | ICD-10-CM | POA: Diagnosis not present

## 2020-11-09 DIAGNOSIS — S72001D Fracture of unspecified part of neck of right femur, subsequent encounter for closed fracture with routine healing: Secondary | ICD-10-CM | POA: Diagnosis not present

## 2020-11-09 DIAGNOSIS — I4891 Unspecified atrial fibrillation: Secondary | ICD-10-CM | POA: Diagnosis not present

## 2020-11-09 DIAGNOSIS — Z951 Presence of aortocoronary bypass graft: Secondary | ICD-10-CM | POA: Diagnosis not present

## 2020-11-12 DIAGNOSIS — J449 Chronic obstructive pulmonary disease, unspecified: Secondary | ICD-10-CM | POA: Diagnosis not present

## 2020-11-12 DIAGNOSIS — H9190 Unspecified hearing loss, unspecified ear: Secondary | ICD-10-CM | POA: Diagnosis not present

## 2020-11-12 DIAGNOSIS — I495 Sick sinus syndrome: Secondary | ICD-10-CM | POA: Diagnosis not present

## 2020-11-12 DIAGNOSIS — S72001D Fracture of unspecified part of neck of right femur, subsequent encounter for closed fracture with routine healing: Secondary | ICD-10-CM | POA: Diagnosis not present

## 2020-11-12 DIAGNOSIS — Z951 Presence of aortocoronary bypass graft: Secondary | ICD-10-CM | POA: Diagnosis not present

## 2020-11-12 DIAGNOSIS — K219 Gastro-esophageal reflux disease without esophagitis: Secondary | ICD-10-CM | POA: Diagnosis not present

## 2020-11-12 DIAGNOSIS — I4891 Unspecified atrial fibrillation: Secondary | ICD-10-CM | POA: Diagnosis not present

## 2020-11-12 DIAGNOSIS — N183 Chronic kidney disease, stage 3 unspecified: Secondary | ICD-10-CM | POA: Diagnosis not present

## 2020-11-12 DIAGNOSIS — I2510 Atherosclerotic heart disease of native coronary artery without angina pectoris: Secondary | ICD-10-CM | POA: Diagnosis not present

## 2020-11-13 DIAGNOSIS — H9190 Unspecified hearing loss, unspecified ear: Secondary | ICD-10-CM | POA: Diagnosis not present

## 2020-11-13 DIAGNOSIS — I495 Sick sinus syndrome: Secondary | ICD-10-CM | POA: Diagnosis not present

## 2020-11-13 DIAGNOSIS — I2510 Atherosclerotic heart disease of native coronary artery without angina pectoris: Secondary | ICD-10-CM | POA: Diagnosis not present

## 2020-11-13 DIAGNOSIS — K219 Gastro-esophageal reflux disease without esophagitis: Secondary | ICD-10-CM | POA: Diagnosis not present

## 2020-11-13 DIAGNOSIS — I4891 Unspecified atrial fibrillation: Secondary | ICD-10-CM | POA: Diagnosis not present

## 2020-11-13 DIAGNOSIS — Z951 Presence of aortocoronary bypass graft: Secondary | ICD-10-CM | POA: Diagnosis not present

## 2020-11-13 DIAGNOSIS — J449 Chronic obstructive pulmonary disease, unspecified: Secondary | ICD-10-CM | POA: Diagnosis not present

## 2020-11-13 DIAGNOSIS — N183 Chronic kidney disease, stage 3 unspecified: Secondary | ICD-10-CM | POA: Diagnosis not present

## 2020-11-13 DIAGNOSIS — S72001D Fracture of unspecified part of neck of right femur, subsequent encounter for closed fracture with routine healing: Secondary | ICD-10-CM | POA: Diagnosis not present

## 2020-11-13 NOTE — Progress Notes (Signed)
Remote pacemaker transmission.   

## 2020-11-14 DIAGNOSIS — I4891 Unspecified atrial fibrillation: Secondary | ICD-10-CM | POA: Diagnosis not present

## 2020-11-14 DIAGNOSIS — K219 Gastro-esophageal reflux disease without esophagitis: Secondary | ICD-10-CM | POA: Diagnosis not present

## 2020-11-14 DIAGNOSIS — N183 Chronic kidney disease, stage 3 unspecified: Secondary | ICD-10-CM | POA: Diagnosis not present

## 2020-11-14 DIAGNOSIS — I2510 Atherosclerotic heart disease of native coronary artery without angina pectoris: Secondary | ICD-10-CM | POA: Diagnosis not present

## 2020-11-14 DIAGNOSIS — H9190 Unspecified hearing loss, unspecified ear: Secondary | ICD-10-CM | POA: Diagnosis not present

## 2020-11-14 DIAGNOSIS — J449 Chronic obstructive pulmonary disease, unspecified: Secondary | ICD-10-CM | POA: Diagnosis not present

## 2020-11-14 DIAGNOSIS — Z951 Presence of aortocoronary bypass graft: Secondary | ICD-10-CM | POA: Diagnosis not present

## 2020-11-14 DIAGNOSIS — N390 Urinary tract infection, site not specified: Secondary | ICD-10-CM | POA: Diagnosis not present

## 2020-11-14 DIAGNOSIS — I495 Sick sinus syndrome: Secondary | ICD-10-CM | POA: Diagnosis not present

## 2020-11-14 DIAGNOSIS — S72001D Fracture of unspecified part of neck of right femur, subsequent encounter for closed fracture with routine healing: Secondary | ICD-10-CM | POA: Diagnosis not present

## 2020-11-15 DIAGNOSIS — I2510 Atherosclerotic heart disease of native coronary artery without angina pectoris: Secondary | ICD-10-CM | POA: Diagnosis not present

## 2020-11-15 DIAGNOSIS — K219 Gastro-esophageal reflux disease without esophagitis: Secondary | ICD-10-CM | POA: Diagnosis not present

## 2020-11-15 DIAGNOSIS — Z951 Presence of aortocoronary bypass graft: Secondary | ICD-10-CM | POA: Diagnosis not present

## 2020-11-15 DIAGNOSIS — J449 Chronic obstructive pulmonary disease, unspecified: Secondary | ICD-10-CM | POA: Diagnosis not present

## 2020-11-15 DIAGNOSIS — S72001D Fracture of unspecified part of neck of right femur, subsequent encounter for closed fracture with routine healing: Secondary | ICD-10-CM | POA: Diagnosis not present

## 2020-11-15 DIAGNOSIS — N183 Chronic kidney disease, stage 3 unspecified: Secondary | ICD-10-CM | POA: Diagnosis not present

## 2020-11-15 DIAGNOSIS — I4891 Unspecified atrial fibrillation: Secondary | ICD-10-CM | POA: Diagnosis not present

## 2020-11-15 DIAGNOSIS — I495 Sick sinus syndrome: Secondary | ICD-10-CM | POA: Diagnosis not present

## 2020-11-15 DIAGNOSIS — H9190 Unspecified hearing loss, unspecified ear: Secondary | ICD-10-CM | POA: Diagnosis not present

## 2020-11-17 DIAGNOSIS — M80052D Age-related osteoporosis with current pathological fracture, left femur, subsequent encounter for fracture with routine healing: Secondary | ICD-10-CM | POA: Diagnosis not present

## 2020-11-17 DIAGNOSIS — L89620 Pressure ulcer of left heel, unstageable: Secondary | ICD-10-CM | POA: Diagnosis not present

## 2020-11-17 DIAGNOSIS — L89310 Pressure ulcer of right buttock, unstageable: Secondary | ICD-10-CM | POA: Diagnosis not present

## 2020-11-19 DIAGNOSIS — I495 Sick sinus syndrome: Secondary | ICD-10-CM | POA: Diagnosis not present

## 2020-11-19 DIAGNOSIS — H9190 Unspecified hearing loss, unspecified ear: Secondary | ICD-10-CM | POA: Diagnosis not present

## 2020-11-19 DIAGNOSIS — Z951 Presence of aortocoronary bypass graft: Secondary | ICD-10-CM | POA: Diagnosis not present

## 2020-11-19 DIAGNOSIS — K219 Gastro-esophageal reflux disease without esophagitis: Secondary | ICD-10-CM | POA: Diagnosis not present

## 2020-11-19 DIAGNOSIS — N183 Chronic kidney disease, stage 3 unspecified: Secondary | ICD-10-CM | POA: Diagnosis not present

## 2020-11-19 DIAGNOSIS — J449 Chronic obstructive pulmonary disease, unspecified: Secondary | ICD-10-CM | POA: Diagnosis not present

## 2020-11-19 DIAGNOSIS — I2510 Atherosclerotic heart disease of native coronary artery without angina pectoris: Secondary | ICD-10-CM | POA: Diagnosis not present

## 2020-11-19 DIAGNOSIS — S72001D Fracture of unspecified part of neck of right femur, subsequent encounter for closed fracture with routine healing: Secondary | ICD-10-CM | POA: Diagnosis not present

## 2020-11-19 DIAGNOSIS — I4891 Unspecified atrial fibrillation: Secondary | ICD-10-CM | POA: Diagnosis not present

## 2020-11-20 DIAGNOSIS — S42001D Fracture of unspecified part of right clavicle, subsequent encounter for fracture with routine healing: Secondary | ICD-10-CM | POA: Diagnosis not present

## 2020-11-20 DIAGNOSIS — S42001A Fracture of unspecified part of right clavicle, initial encounter for closed fracture: Secondary | ICD-10-CM | POA: Diagnosis not present

## 2020-11-21 DIAGNOSIS — K219 Gastro-esophageal reflux disease without esophagitis: Secondary | ICD-10-CM | POA: Diagnosis not present

## 2020-11-21 DIAGNOSIS — I4891 Unspecified atrial fibrillation: Secondary | ICD-10-CM | POA: Diagnosis not present

## 2020-11-21 DIAGNOSIS — H9190 Unspecified hearing loss, unspecified ear: Secondary | ICD-10-CM | POA: Diagnosis not present

## 2020-11-21 DIAGNOSIS — J449 Chronic obstructive pulmonary disease, unspecified: Secondary | ICD-10-CM | POA: Diagnosis not present

## 2020-11-21 DIAGNOSIS — Z951 Presence of aortocoronary bypass graft: Secondary | ICD-10-CM | POA: Diagnosis not present

## 2020-11-21 DIAGNOSIS — I495 Sick sinus syndrome: Secondary | ICD-10-CM | POA: Diagnosis not present

## 2020-11-21 DIAGNOSIS — N183 Chronic kidney disease, stage 3 unspecified: Secondary | ICD-10-CM | POA: Diagnosis not present

## 2020-11-21 DIAGNOSIS — S72001D Fracture of unspecified part of neck of right femur, subsequent encounter for closed fracture with routine healing: Secondary | ICD-10-CM | POA: Diagnosis not present

## 2020-11-21 DIAGNOSIS — I2510 Atherosclerotic heart disease of native coronary artery without angina pectoris: Secondary | ICD-10-CM | POA: Diagnosis not present

## 2020-11-22 DIAGNOSIS — J449 Chronic obstructive pulmonary disease, unspecified: Secondary | ICD-10-CM | POA: Diagnosis not present

## 2020-11-22 DIAGNOSIS — K219 Gastro-esophageal reflux disease without esophagitis: Secondary | ICD-10-CM | POA: Diagnosis not present

## 2020-11-22 DIAGNOSIS — N183 Chronic kidney disease, stage 3 unspecified: Secondary | ICD-10-CM | POA: Diagnosis not present

## 2020-11-22 DIAGNOSIS — H9190 Unspecified hearing loss, unspecified ear: Secondary | ICD-10-CM | POA: Diagnosis not present

## 2020-11-22 DIAGNOSIS — I2510 Atherosclerotic heart disease of native coronary artery without angina pectoris: Secondary | ICD-10-CM | POA: Diagnosis not present

## 2020-11-22 DIAGNOSIS — Z951 Presence of aortocoronary bypass graft: Secondary | ICD-10-CM | POA: Diagnosis not present

## 2020-11-22 DIAGNOSIS — I495 Sick sinus syndrome: Secondary | ICD-10-CM | POA: Diagnosis not present

## 2020-11-22 DIAGNOSIS — I4891 Unspecified atrial fibrillation: Secondary | ICD-10-CM | POA: Diagnosis not present

## 2020-11-22 DIAGNOSIS — S72001D Fracture of unspecified part of neck of right femur, subsequent encounter for closed fracture with routine healing: Secondary | ICD-10-CM | POA: Diagnosis not present

## 2020-11-26 DIAGNOSIS — I2510 Atherosclerotic heart disease of native coronary artery without angina pectoris: Secondary | ICD-10-CM | POA: Diagnosis not present

## 2020-11-26 DIAGNOSIS — K219 Gastro-esophageal reflux disease without esophagitis: Secondary | ICD-10-CM | POA: Diagnosis not present

## 2020-11-26 DIAGNOSIS — S72001D Fracture of unspecified part of neck of right femur, subsequent encounter for closed fracture with routine healing: Secondary | ICD-10-CM | POA: Diagnosis not present

## 2020-11-26 DIAGNOSIS — I4891 Unspecified atrial fibrillation: Secondary | ICD-10-CM | POA: Diagnosis not present

## 2020-11-26 DIAGNOSIS — I495 Sick sinus syndrome: Secondary | ICD-10-CM | POA: Diagnosis not present

## 2020-11-26 DIAGNOSIS — J449 Chronic obstructive pulmonary disease, unspecified: Secondary | ICD-10-CM | POA: Diagnosis not present

## 2020-11-26 DIAGNOSIS — Z951 Presence of aortocoronary bypass graft: Secondary | ICD-10-CM | POA: Diagnosis not present

## 2020-11-26 DIAGNOSIS — H9190 Unspecified hearing loss, unspecified ear: Secondary | ICD-10-CM | POA: Diagnosis not present

## 2020-11-26 DIAGNOSIS — N183 Chronic kidney disease, stage 3 unspecified: Secondary | ICD-10-CM | POA: Diagnosis not present

## 2020-11-29 DIAGNOSIS — Z951 Presence of aortocoronary bypass graft: Secondary | ICD-10-CM | POA: Diagnosis not present

## 2020-11-29 DIAGNOSIS — I2510 Atherosclerotic heart disease of native coronary artery without angina pectoris: Secondary | ICD-10-CM | POA: Diagnosis not present

## 2020-11-29 DIAGNOSIS — I4891 Unspecified atrial fibrillation: Secondary | ICD-10-CM | POA: Diagnosis not present

## 2020-11-29 DIAGNOSIS — J449 Chronic obstructive pulmonary disease, unspecified: Secondary | ICD-10-CM | POA: Diagnosis not present

## 2020-11-29 DIAGNOSIS — H9190 Unspecified hearing loss, unspecified ear: Secondary | ICD-10-CM | POA: Diagnosis not present

## 2020-11-29 DIAGNOSIS — N183 Chronic kidney disease, stage 3 unspecified: Secondary | ICD-10-CM | POA: Diagnosis not present

## 2020-11-29 DIAGNOSIS — K219 Gastro-esophageal reflux disease without esophagitis: Secondary | ICD-10-CM | POA: Diagnosis not present

## 2020-11-29 DIAGNOSIS — I495 Sick sinus syndrome: Secondary | ICD-10-CM | POA: Diagnosis not present

## 2020-11-29 DIAGNOSIS — S72001D Fracture of unspecified part of neck of right femur, subsequent encounter for closed fracture with routine healing: Secondary | ICD-10-CM | POA: Diagnosis not present

## 2020-12-17 DIAGNOSIS — L89620 Pressure ulcer of left heel, unstageable: Secondary | ICD-10-CM | POA: Diagnosis not present

## 2020-12-17 DIAGNOSIS — M80052D Age-related osteoporosis with current pathological fracture, left femur, subsequent encounter for fracture with routine healing: Secondary | ICD-10-CM | POA: Diagnosis not present

## 2020-12-17 DIAGNOSIS — L89310 Pressure ulcer of right buttock, unstageable: Secondary | ICD-10-CM | POA: Diagnosis not present

## 2020-12-28 DIAGNOSIS — R350 Frequency of micturition: Secondary | ICD-10-CM | POA: Diagnosis not present

## 2021-01-14 ENCOUNTER — Telehealth: Payer: Self-pay

## 2021-01-14 ENCOUNTER — Telehealth: Payer: Self-pay | Admitting: Cardiology

## 2021-01-14 NOTE — Telephone Encounter (Signed)
    Pt c/o medication issue:  1. Name of Medication: amiodarone (PACERONE) 200 MG tablet  2. How are you currently taking this medication (dosage and times per day)? Take 200 mg per one tablet by mouth twice daily for three days. Then take 200 mg per one tablet by mouth once daily.  3. Are you having a reaction (difficulty breathing--STAT)?   4. What is your medication issue? Pt would like to speak with Dr. Harriet Masson or her nurse regarding this medication. He said he has some questions

## 2021-01-14 NOTE — Telephone Encounter (Signed)
Spoke with patient about continuing to take amiodarone as prescribed. Patient added to Dr. Terrial Rhodes schedule for a follow up appointment. Patient verbalizes understanding. No further questions or concerns at this time.

## 2021-01-14 NOTE — Telephone Encounter (Signed)
Left message for patient to return our call.

## 2021-01-17 DIAGNOSIS — L89310 Pressure ulcer of right buttock, unstageable: Secondary | ICD-10-CM | POA: Diagnosis not present

## 2021-01-17 DIAGNOSIS — M80052D Age-related osteoporosis with current pathological fracture, left femur, subsequent encounter for fracture with routine healing: Secondary | ICD-10-CM | POA: Diagnosis not present

## 2021-01-17 DIAGNOSIS — L89620 Pressure ulcer of left heel, unstageable: Secondary | ICD-10-CM | POA: Diagnosis not present

## 2021-01-30 ENCOUNTER — Ambulatory Visit (INDEPENDENT_AMBULATORY_CARE_PROVIDER_SITE_OTHER): Payer: Medicare HMO

## 2021-01-30 DIAGNOSIS — I442 Atrioventricular block, complete: Secondary | ICD-10-CM | POA: Diagnosis not present

## 2021-01-30 LAB — CUP PACEART REMOTE DEVICE CHECK
Battery Remaining Longevity: 162 mo
Battery Voltage: 3.18 V
Brady Statistic RV Percent Paced: 99.86 %
Date Time Interrogation Session: 20220629063013
Implantable Lead Implant Date: 20211227
Implantable Lead Location: 753860
Implantable Lead Model: 3830
Implantable Pulse Generator Implant Date: 20211227
Lead Channel Impedance Value: 456 Ohm
Lead Channel Impedance Value: 570 Ohm
Lead Channel Pacing Threshold Amplitude: 0.5 V
Lead Channel Pacing Threshold Pulse Width: 0.4 ms
Lead Channel Sensing Intrinsic Amplitude: 16.875 mV
Lead Channel Sensing Intrinsic Amplitude: 16.875 mV
Lead Channel Setting Pacing Amplitude: 2 V
Lead Channel Setting Pacing Pulse Width: 0.4 ms
Lead Channel Setting Sensing Sensitivity: 0.9 mV

## 2021-02-13 NOTE — Progress Notes (Signed)
Remote pacemaker transmission.   

## 2021-02-16 DIAGNOSIS — L89310 Pressure ulcer of right buttock, unstageable: Secondary | ICD-10-CM | POA: Diagnosis not present

## 2021-02-16 DIAGNOSIS — L89620 Pressure ulcer of left heel, unstageable: Secondary | ICD-10-CM | POA: Diagnosis not present

## 2021-02-16 DIAGNOSIS — M80052D Age-related osteoporosis with current pathological fracture, left femur, subsequent encounter for fracture with routine healing: Secondary | ICD-10-CM | POA: Diagnosis not present

## 2021-03-19 DIAGNOSIS — M80052D Age-related osteoporosis with current pathological fracture, left femur, subsequent encounter for fracture with routine healing: Secondary | ICD-10-CM | POA: Diagnosis not present

## 2021-03-19 DIAGNOSIS — L89620 Pressure ulcer of left heel, unstageable: Secondary | ICD-10-CM | POA: Diagnosis not present

## 2021-03-19 DIAGNOSIS — L89310 Pressure ulcer of right buttock, unstageable: Secondary | ICD-10-CM | POA: Diagnosis not present

## 2021-04-03 ENCOUNTER — Other Ambulatory Visit: Payer: Self-pay

## 2021-04-03 ENCOUNTER — Encounter: Payer: Self-pay | Admitting: Cardiology

## 2021-04-03 ENCOUNTER — Ambulatory Visit: Payer: Medicare HMO | Admitting: Cardiology

## 2021-04-03 VITALS — BP 120/64 | HR 62 | Ht 71.0 in | Wt 160.0 lb

## 2021-04-03 DIAGNOSIS — Z0000 Encounter for general adult medical examination without abnormal findings: Secondary | ICD-10-CM | POA: Diagnosis not present

## 2021-04-03 DIAGNOSIS — M159 Polyosteoarthritis, unspecified: Secondary | ICD-10-CM | POA: Diagnosis not present

## 2021-04-03 DIAGNOSIS — Z720 Tobacco use: Secondary | ICD-10-CM | POA: Diagnosis not present

## 2021-04-03 DIAGNOSIS — I25810 Atherosclerosis of coronary artery bypass graft(s) without angina pectoris: Secondary | ICD-10-CM | POA: Diagnosis not present

## 2021-04-03 DIAGNOSIS — Z951 Presence of aortocoronary bypass graft: Secondary | ICD-10-CM | POA: Diagnosis not present

## 2021-04-03 DIAGNOSIS — I10 Essential (primary) hypertension: Secondary | ICD-10-CM

## 2021-04-03 DIAGNOSIS — E7800 Pure hypercholesterolemia, unspecified: Secondary | ICD-10-CM | POA: Diagnosis not present

## 2021-04-03 DIAGNOSIS — I480 Paroxysmal atrial fibrillation: Secondary | ICD-10-CM

## 2021-04-03 DIAGNOSIS — I495 Sick sinus syndrome: Secondary | ICD-10-CM

## 2021-04-03 DIAGNOSIS — I2510 Atherosclerotic heart disease of native coronary artery without angina pectoris: Secondary | ICD-10-CM

## 2021-04-03 DIAGNOSIS — I4891 Unspecified atrial fibrillation: Secondary | ICD-10-CM | POA: Diagnosis not present

## 2021-04-03 DIAGNOSIS — Z1331 Encounter for screening for depression: Secondary | ICD-10-CM | POA: Diagnosis not present

## 2021-04-03 DIAGNOSIS — G729 Myopathy, unspecified: Secondary | ICD-10-CM | POA: Diagnosis not present

## 2021-04-03 DIAGNOSIS — I472 Ventricular tachycardia: Secondary | ICD-10-CM | POA: Diagnosis not present

## 2021-04-03 DIAGNOSIS — N400 Enlarged prostate without lower urinary tract symptoms: Secondary | ICD-10-CM | POA: Diagnosis not present

## 2021-04-03 NOTE — Patient Instructions (Signed)
Medication Instructions:  Your physician recommends that you continue on your current medications as directed. Please refer to the Current Medication list given to you today.  *If you need a refill on your cardiac medications before your next appointment, please call your pharmacy*   Lab Work: None ordered If you have labs (blood work) drawn today and your tests are completely normal, you will receive your results only by: [place] (if you have MyChart) OR A paper copy in the mail If you have any lab test that is abnormal or we need to change your treatment, we will call you to review the results.   Testing/Procedures: None ordered   Follow-Up: At [facility], you and your health needs are our priority.  As part of our continuing mission to provide you with exceptional heart care, we have created designated Provider Care Teams.  These Care Teams include your primary Cardiologist (physician) and Advanced Practice Providers (APPs -  Physician Assistants and Nurse Practitioners) who all work together to provide you with the care you need, when you need it.  We recommend signing up for the patient portal called "MyChart".  Sign up information is provided on this After Visit Summary.  MyChart is used to connect with patients for Virtual Visits (Telemedicine).  Patients are able to view lab/test results, encounter notes, upcoming appointments, etc.  Non-urgent messages can be sent to your provider as well.   To learn more about what you can do with MyChart, go to [email or web address].    Your next appointment:   12 month(s)  The format for your next appointment:   In Person  Provider:   Jenne Campus, MD   Other Instructions NA

## 2021-04-06 NOTE — Progress Notes (Signed)
Cardiology Office Note:    Date:  04/06/2021   ID:  Cameron Willis, DOB 31-Mar-1934, MRN [id number]  PCP:  Cameron Roger, MD  Cardiologist:  Cameron Salines, DO  Electrophysiologist:  Cameron Meredith Leeds, MD   Referring MD: Cameron Roger, MD   Chief Complaint  Patient presents with   Follow-up    History of Present Illness:    Cameron Willis is a 87 y.o. male with a hx of coronary artery disease status post CABG 2013, sick sinus syndrome status post pacemaker in December 2021, hypertension, hyperlipidemia, paroxysmal atrial fibrillation on Eliquis and COPD.  Last saw the patient on May 03, 2021 at that time we talked about his monitor again he was asymptomatic and he had declined any further intervention.  Therefore we Cameron continue him on his amiodarone and he was off beta-blockers.  In interim the patient was admitted to [facility] in December 2022.  He was evaluated by me and significant concern for sick sinus syndrome therefore he was transferred to [facility] for pacemaker implantation.  He is status post pacemaker implantation and has been doing well since. He is here today with his stepdaughter and offers no complaints at this time.   Past Medical History:  Diagnosis Date   Anxiety and depression    Bradycardia by electrocardiogram 01/20/2018   Cervical disc disorder at C4-C5 level with myelopathy 12/05/2016   Chronic atrial fibrillation ([place]) 03/01/2018   Contracture of right knee 12/05/2016   COPD (chronic obstructive pulmonary disease) ([place]) 03/22/2012   Coronary artery disease involving native coronary artery of native heart without angina pectoris 03/22/2012   Overview:  Coronary artery bypass graft August 2013 LIMA to LAD and SVG to RCA and SVG to obtuse marginal   Dyslipidemia 03/26/2015   Esophageal stenosis    Gait disturbance 10/14/2017   GERD (gastroesophageal reflux disease) 03/22/2012   Hemorrhoids    History of degenerative disc disease    HTN  (hypertension) 03/22/2012   Hypertrophy of prostate    MI, old 03/22/2012   NSVT (nonsustained ventricular tachycardia) ([place]) 04/05/2020   Osteoarthrosis    Paroxysmal atrial fibrillation ([place]) 03/26/2015   Overview:  Chads score 2, was anticoagulated but developed tarry stool optic admission was withdrawn for now scheduled to see gastroenterologist   Persistent atrial fibrillation ([place]) 01/20/2018   Pre-op evaluation 03/25/2016   S/P CABG x 3 03/25/2012   Tobacco abuse 03/22/2012   Overview:  Quit 2 years ago    Past Surgical History:  Procedure Laterality Date   CARDIAC CATHETERIZATION     COLONOSCOPY  05/07/2015   Colonic polyps status post polypectomy. Internal hemorrhoids ( likely etiology of rectal bleeding)    CORONARY ARTERY BYPASS GRAFT     ESOPHAGOGASTRODUODENOSCOPY  11/06/2005   Smal hiatal hernia. Moderate gastritis   HEMORRHOID SURGERY  1996   PACEMAKER IMPLANT N/A 07/30/2020   Procedure: PACEMAKER IMPLANT;  Surgeon: Cameron Lance, MD;  Location: [place] [facility];  Service: Cardiovascular;  Laterality: N/A;    Current Medications: Current Meds  Medication Sig   acetaminophen (TYLENOL) 325 MG tablet Take 650 mg by mouth daily.   amiodarone (PACERONE) 200 MG tablet Take 200 mg per one tablet by mouth twice daily for three days. Then take 200 mg per one tablet by mouth once daily.   amLODipine (NORVASC) 5 MG tablet Take 5 mg by mouth daily.   CALCIUM PO Take 1 tablet by mouth daily.   celecoxib (  CELEBREX) 200 MG capsule Take 200 mg by mouth daily.   cholecalciferol (VITAMIN D3) 25 MCG (1000 UNIT) tablet Take 1,000 Units by mouth daily.   ELIQUIS 5 MG TABS tablet Take 1 tablet (5 mg total) by mouth 2 (two) times daily. Resume Saturday with the evening dose.   meclizine (ANTIVERT) 12.5 MG tablet Take 1 tablet by mouth as needed for dizziness.   nitroGLYCERIN (NITROSTAT) 0.4 MG SL tablet Place 1 tablet (0.4 mg total) under the tongue as needed for chest pain.   pantoprazole  (PROTONIX) 40 MG tablet Take 40 mg by mouth daily.   polyethylene glycol (MIRALAX / GLYCOLAX) 17 g packet Take 17 g by mouth daily.   pravastatin (PRAVACHOL) 10 MG tablet Take 10 mg by mouth daily.   pravastatin (PRAVACHOL) 80 MG tablet Take 80 mg by mouth daily.     Allergies:   Aciphex [rabeprazole sodium], Lipitor [atorvastatin], Omeprazole, and Pravachol [pravastatin]   Social History   Socioeconomic History   Marital status: Married    Spouse name: Not on file   Number of children: Not on file   Years of education: Not on file   Highest education level: Not on file  Occupational History   Not on file  Tobacco Use   Smoking status: Former   Smokeless tobacco: Never  Vaping Use   Vaping Use: Never used  Substance and Sexual Activity   Alcohol use: No   Drug use: No   Sexual activity: Not on file  Other Topics Concern   Not on file  Social History Narrative   Not on file   Social Determinants of Health   Financial Resource Strain: Not on file  Food Insecurity: Not on file  Transportation Needs: Not on file  Physical Activity: Not on file  Stress: Not on file  Social Connections: Not on file     Family History: The patient's family history includes Cancer in his father; Esophageal cancer in his father. There is no history of Colon cancer.  ROS:   Review of Systems  Constitution: Negative for decreased appetite, fever and weight gain.  HENT: Negative for congestion, ear discharge, hoarse voice and sore throat.   Eyes: Negative for discharge, redness, vision loss in right eye and visual halos.  Cardiovascular: Negative for chest pain, dyspnea on exertion, leg swelling, orthopnea and palpitations.  Respiratory: Negative for cough, hemoptysis, shortness of breath and snoring.   Endocrine: Negative for heat intolerance and polyphagia.  Hematologic/Lymphatic: Negative for bleeding problem. Does not bruise/bleed easily.  Skin: Negative for flushing, nail changes, rash  and suspicious lesions.  Musculoskeletal: Negative for arthritis, joint pain, muscle cramps, myalgias, neck pain and stiffness.  Gastrointestinal: Negative for abdominal pain, bowel incontinence, diarrhea and excessive appetite.  Genitourinary: Negative for decreased libido, genital sores and incomplete emptying.  Neurological: Negative for brief paralysis, focal weakness, headaches and loss of balance.  Psychiatric/Behavioral: Negative for altered mental status, depression and suicidal ideas.  Allergic/Immunologic: Negative for HIV exposure and persistent infections.    EKGs/Labs/Other Studies Reviewed:    The following studies were reviewed today:   EKG: None today peer  Zio monitor  The patient wore the monitor for 6 days 20 hours starting February 23, 2020. Indication: Chronic anticoagulation   The minimum heart rate was 28 bpm, maximum heart rate was 148 bpm, and average heart rate was 48 bpm.   Atrial Fibrillation occurred continuously (100%burden), ranging from 28-94 bpm (average of 48 bpm). Intermittent Bundle Branch Block was  present.    27 Pauses occurred, the longest lasting 3.6 seconds (17 bpm).   1 run of Ventricular Tachycardia occurred lasting 16 beats with a maximum heart rate of 146 bpm (average 116 bpm).    Premature atrial complexes were rare. Premature Ventricular complexes rare.   1 patient triggered event noted associated with atrial fibrillation.   Conclusion: This study is remarkable for the following:                                   1. Chronic atrial fibrillation.                             2. Total of 27 pauses while in atrial fibrillation with the longest pause at 3.6 seconds.                             3. 1 episode of ventricular tachycardia (16 beat run)     Echo IMPRESSIONS:   1. Left ventricular ejection fraction, by estimation, is 60 to 65%. The left ventricle has normal function. The left ventricle has no regional wall motion abnormalities.  Left ventricular diastolic parameters are indeterminate.   2. Left atrial size was moderately dilated.   3. Rhythm: atrial fibrillation.    Pharmacologic stress test: Nuclear stress EF: 71%. The left ventricular ejection fraction is hyperdynamic (>65%). There was no ST segment deviation noted during stress. Defect 1: There is a medium defect of moderate severity present in the basal inferior and basal inferolateral location. This is a low risk study. No ischemia or MI. Diaphragmatic attenuation. Hyperdynamic LV. Recent Labs: 05/03/2020: Magnesium 2.2 07/28/2020: B Natriuretic Peptide 282.4; TSH 1.689 07/31/2020: BUN 22; Creatinine, Ser 1.40; Hemoglobin 14.7; Platelets 210; Potassium 4.9; Sodium 134  Recent Lipid Panel No results found for: CHOL, TRIG, HDL, CHOLHDL, VLDL, LDLCALC, LDLDIRECT  Physical Exam:    VS:  BP 120/64 (BP Location: Right Arm, Patient Position: Sitting, Cuff Size: Normal)   Pulse 62   Ht '5\' 11"'$  (1.803 m)   Wt 160 lb (72.6 kg) Comment: Verbal, in wheelchair  SpO2 94%   BMI 22.32 kg/m     Wt Readings from Last 3 Encounters:  04/03/21 160 lb (72.6 kg)  07/31/20 154 lb 10.9 oz (70.2 kg)  05/03/20 170 lb (77.1 kg)     GEN: Well nourished, well developed in no acute distress HEENT: Normal NECK: No JVD; No carotid bruits LYMPHATICS: No lymphadenopathy CARDIAC: S1S2 noted,RRR, no murmurs, rubs, gallops RESPIRATORY:  Clear to auscultation without rales, wheezing or rhonchi  ABDOMEN: Soft, non-tender, non-distended, +bowel sounds, no guarding. EXTREMITIES: No edema, No cyanosis, no clubbing MUSCULOSKELETAL:  No deformity  SKIN: Warm and dry NEUROLOGIC:  Alert and oriented x 3, non-focal PSYCHIATRIC:  Normal affect, good insight  ASSESSMENT:    1. Coronary artery disease involving native coronary artery of native heart without angina pectoris   2. Primary hypertension   3. Paroxysmal atrial fibrillation (HCC)   4. SSS (sick sinus syndrome) ([place])   5.  S/P CABG x 3   6. Tobacco abuse    PLAN:     1.  Overall he appears to be doing well from a cardiovascular standpoint.  He is happy and is planning a cookout for his family and his wife's family over the holiday.  2.  We Cameron continue patient his current medication regimen.  We Cameron send refills  The patient is in agreement with the above plan. The patient left the office in stable condition.  The patient Cameron follow up in   Medication Adjustments/Labs and Tests Ordered: Current medicines are reviewed at length with the patient today.  Concerns regarding medicines are outlined above.  No orders of the defined types were placed in this encounter.  No orders of the defined types were placed in this encounter.   Patient Instructions  Medication Instructions:  Your physician recommends that you continue on your current medications as directed. Please refer to the Current Medication list given to you today.  *If you need a refill on your cardiac medications before your next appointment, please call your pharmacy*   Lab Work: None ordered If you have labs (blood work) drawn today and your tests are completely normal, you Cameron receive your results only by: [place] (if you have MyChart) OR A paper copy in the mail If you have any lab test that is abnormal or we need to change your treatment, we Cameron call you to review the results.   Testing/Procedures: None ordered   Follow-Up: At [facility], you and your health needs are our priority.  As part of our continuing mission to provide you with exceptional heart care, we have created designated Provider Care Teams.  These Care Teams include your primary Cardiologist (physician) and Advanced Practice Providers (APPs -  Physician Assistants and Nurse Practitioners) who all work together to provide you with the care you need, when you need it.  We recommend signing up for the patient portal called "MyChart".  Sign up information  is provided on this After Visit Summary.  MyChart is used to connect with patients for Virtual Visits (Telemedicine).  Patients are able to view lab/test results, encounter notes, upcoming appointments, etc.  Non-urgent messages can be sent to your provider as well.   To learn more about what you can do with MyChart, go to [email or web address].    Your next appointment:   12 month(s)  The format for your next appointment:   In Person  Provider:   Jenne Campus, MD   Other Instructions NA    Adopting a Healthy Lifestyle.  Know what a healthy weight is for you (roughly BMI <25) and aim to maintain this   Aim for 7+ servings of fruits and vegetables daily   65-80+ fluid ounces of water or unsweet tea for healthy kidneys   Limit to max 1 drink of alcohol per day; avoid smoking/tobacco   Limit animal fats in diet for cholesterol and heart health - choose grass fed whenever available   Avoid highly processed foods, and foods high in saturated/trans fats   Aim for low stress - take time to unwind and care for your mental health   Aim for 150 min of moderate intensity exercise weekly for heart health, and weights twice weekly for bone health   Aim for 7-9 hours of sleep daily   When it comes to diets, agreement about the perfect plan isnt easy to find, even among the experts. Experts at the [facility] developed an idea known as the Healthy Eating Plate. Just imagine a plate divided into logical, healthy portions.   The emphasis is on diet quality:   Load up on vegetables and fruits - one-half of your plate: Aim for color and variety, and remember that potatoes dont count.  Go for whole grains - one-quarter of your plate: Whole wheat, barley, wheat berries, quinoa, oats, brown rice, and foods made with them. If you want pasta, go with whole wheat pasta.   Protein power - one-quarter of your plate: Fish, chicken, beans, and nuts are all healthy,  versatile protein sources. Limit red meat.   The diet, however, does go beyond the plate, offering a few other suggestions.   Use healthy plant oils, such as olive, canola, soy, corn, sunflower and peanut. Check the labels, and avoid partially hydrogenated oil, which have unhealthy trans fats.   If youre thirsty, drink water. Coffee and tea are good in moderation, but skip sugary drinks and limit milk and dairy products to one or two daily servings.   The type of carbohydrate in the diet is more important than the amount. Some sources of carbohydrates, such as vegetables, fruits, whole grains, and beans-are healthier than others.   Finally, stay active  Signed, Cameron Salines, DO  04/06/2021 11:35 AM    [place]

## 2021-04-19 DIAGNOSIS — M80052D Age-related osteoporosis with current pathological fracture, left femur, subsequent encounter for fracture with routine healing: Secondary | ICD-10-CM | POA: Diagnosis not present

## 2021-04-19 DIAGNOSIS — L89620 Pressure ulcer of left heel, unstageable: Secondary | ICD-10-CM | POA: Diagnosis not present

## 2021-04-19 DIAGNOSIS — L89310 Pressure ulcer of right buttock, unstageable: Secondary | ICD-10-CM | POA: Diagnosis not present

## 2021-04-29 DIAGNOSIS — S91209A Unspecified open wound of unspecified toe(s) with damage to nail, initial encounter: Secondary | ICD-10-CM | POA: Diagnosis not present

## 2021-05-01 ENCOUNTER — Ambulatory Visit (INDEPENDENT_AMBULATORY_CARE_PROVIDER_SITE_OTHER): Payer: Medicare HMO

## 2021-05-01 DIAGNOSIS — I495 Sick sinus syndrome: Secondary | ICD-10-CM

## 2021-05-02 DIAGNOSIS — R351 Nocturia: Secondary | ICD-10-CM | POA: Diagnosis not present

## 2021-05-02 DIAGNOSIS — N401 Enlarged prostate with lower urinary tract symptoms: Secondary | ICD-10-CM | POA: Diagnosis not present

## 2021-05-02 LAB — CUP PACEART REMOTE DEVICE CHECK
Battery Remaining Longevity: 158 mo
Battery Voltage: 3.14 V
Brady Statistic RV Percent Paced: 99.86 %
Date Time Interrogation Session: 20220928062103
Implantable Lead Implant Date: 20211227
Implantable Lead Location: 753860
Implantable Lead Model: 3830
Implantable Pulse Generator Implant Date: 20211227
Lead Channel Impedance Value: 456 Ohm
Lead Channel Impedance Value: 570 Ohm
Lead Channel Pacing Threshold Amplitude: 0.625 V
Lead Channel Pacing Threshold Pulse Width: 0.4 ms
Lead Channel Sensing Intrinsic Amplitude: 15.5 mV
Lead Channel Sensing Intrinsic Amplitude: 15.5 mV
Lead Channel Setting Pacing Amplitude: 2 V
Lead Channel Setting Pacing Pulse Width: 0.4 ms
Lead Channel Setting Sensing Sensitivity: 0.9 mV

## 2021-05-06 DIAGNOSIS — S91209D Unspecified open wound of unspecified toe(s) with damage to nail, subsequent encounter: Secondary | ICD-10-CM | POA: Diagnosis not present

## 2021-05-06 DIAGNOSIS — Z23 Encounter for immunization: Secondary | ICD-10-CM | POA: Diagnosis not present

## 2021-05-08 NOTE — Progress Notes (Signed)
Remote pacemaker transmission.   

## 2021-05-19 DIAGNOSIS — M80052D Age-related osteoporosis with current pathological fracture, left femur, subsequent encounter for fracture with routine healing: Secondary | ICD-10-CM | POA: Diagnosis not present

## 2021-05-19 DIAGNOSIS — L89620 Pressure ulcer of left heel, unstageable: Secondary | ICD-10-CM | POA: Diagnosis not present

## 2021-05-19 DIAGNOSIS — L89310 Pressure ulcer of right buttock, unstageable: Secondary | ICD-10-CM | POA: Diagnosis not present

## 2021-06-15 DIAGNOSIS — Z950 Presence of cardiac pacemaker: Secondary | ICD-10-CM | POA: Diagnosis not present

## 2021-06-15 DIAGNOSIS — Z951 Presence of aortocoronary bypass graft: Secondary | ICD-10-CM | POA: Diagnosis not present

## 2021-06-15 DIAGNOSIS — I2510 Atherosclerotic heart disease of native coronary artery without angina pectoris: Secondary | ICD-10-CM | POA: Diagnosis not present

## 2021-06-15 DIAGNOSIS — I119 Hypertensive heart disease without heart failure: Secondary | ICD-10-CM | POA: Diagnosis not present

## 2021-06-15 DIAGNOSIS — R079 Chest pain, unspecified: Secondary | ICD-10-CM | POA: Diagnosis not present

## 2021-06-15 DIAGNOSIS — J449 Chronic obstructive pulmonary disease, unspecified: Secondary | ICD-10-CM | POA: Diagnosis not present

## 2021-06-15 DIAGNOSIS — I252 Old myocardial infarction: Secondary | ICD-10-CM | POA: Diagnosis not present

## 2021-06-15 DIAGNOSIS — I482 Chronic atrial fibrillation, unspecified: Secondary | ICD-10-CM | POA: Diagnosis not present

## 2021-06-15 DIAGNOSIS — R0789 Other chest pain: Secondary | ICD-10-CM | POA: Diagnosis not present

## 2021-06-25 ENCOUNTER — Other Ambulatory Visit: Payer: Self-pay | Admitting: Cardiology

## 2021-07-11 DIAGNOSIS — I4891 Unspecified atrial fibrillation: Secondary | ICD-10-CM | POA: Diagnosis not present

## 2021-07-11 DIAGNOSIS — I10 Essential (primary) hypertension: Secondary | ICD-10-CM | POA: Diagnosis not present

## 2021-07-31 ENCOUNTER — Ambulatory Visit (INDEPENDENT_AMBULATORY_CARE_PROVIDER_SITE_OTHER): Payer: Medicare HMO

## 2021-07-31 DIAGNOSIS — I495 Sick sinus syndrome: Secondary | ICD-10-CM

## 2021-07-31 LAB — CUP PACEART REMOTE DEVICE CHECK
Battery Remaining Longevity: 154 mo
Battery Voltage: 3.09 V
Brady Statistic RV Percent Paced: 99.34 %
Date Time Interrogation Session: 20221228010753
Implantable Lead Implant Date: 20211227
Implantable Lead Location: 753860
Implantable Lead Model: 3830
Implantable Pulse Generator Implant Date: 20211227
Lead Channel Impedance Value: 418 Ohm
Lead Channel Impedance Value: 532 Ohm
Lead Channel Pacing Threshold Amplitude: 0.625 V
Lead Channel Pacing Threshold Pulse Width: 0.4 ms
Lead Channel Sensing Intrinsic Amplitude: 14.625 mV
Lead Channel Sensing Intrinsic Amplitude: 14.625 mV
Lead Channel Setting Pacing Amplitude: 2 V
Lead Channel Setting Pacing Pulse Width: 0.4 ms
Lead Channel Setting Sensing Sensitivity: 0.9 mV

## 2021-08-12 NOTE — Progress Notes (Signed)
Remote pacemaker transmission.   

## 2021-10-11 DIAGNOSIS — J449 Chronic obstructive pulmonary disease, unspecified: Secondary | ICD-10-CM | POA: Diagnosis not present

## 2021-10-11 DIAGNOSIS — I4891 Unspecified atrial fibrillation: Secondary | ICD-10-CM | POA: Diagnosis not present

## 2021-10-30 ENCOUNTER — Ambulatory Visit (INDEPENDENT_AMBULATORY_CARE_PROVIDER_SITE_OTHER): Payer: Medicare HMO

## 2021-10-30 DIAGNOSIS — I495 Sick sinus syndrome: Secondary | ICD-10-CM

## 2021-10-30 LAB — CUP PACEART REMOTE DEVICE CHECK
Battery Remaining Longevity: 152 mo
Battery Voltage: 3.06 V
Brady Statistic RV Percent Paced: 99.34 %
Date Time Interrogation Session: 20230328225740
Implantable Lead Implant Date: 20211227
Implantable Lead Location: 753860
Implantable Lead Model: 3830
Implantable Pulse Generator Implant Date: 20211227
Lead Channel Impedance Value: 437 Ohm
Lead Channel Impedance Value: 551 Ohm
Lead Channel Pacing Threshold Amplitude: 0.75 V
Lead Channel Pacing Threshold Pulse Width: 0.4 ms
Lead Channel Sensing Intrinsic Amplitude: 15.625 mV
Lead Channel Sensing Intrinsic Amplitude: 15.625 mV
Lead Channel Setting Pacing Amplitude: 2 V
Lead Channel Setting Pacing Pulse Width: 0.4 ms
Lead Channel Setting Sensing Sensitivity: 0.9 mV

## 2021-10-31 DIAGNOSIS — H353131 Nonexudative age-related macular degeneration, bilateral, early dry stage: Secondary | ICD-10-CM | POA: Diagnosis not present

## 2021-10-31 DIAGNOSIS — Z961 Presence of intraocular lens: Secondary | ICD-10-CM | POA: Diagnosis not present

## 2021-10-31 DIAGNOSIS — Z9849 Cataract extraction status, unspecified eye: Secondary | ICD-10-CM | POA: Diagnosis not present

## 2021-10-31 DIAGNOSIS — H5201 Hypermetropia, right eye: Secondary | ICD-10-CM | POA: Diagnosis not present

## 2021-10-31 DIAGNOSIS — H52223 Regular astigmatism, bilateral: Secondary | ICD-10-CM | POA: Diagnosis not present

## 2021-11-11 IMAGING — CR DG CHEST 2V
2 series · 2 of 2 positions shown · non-contrast
Comparison: 07/27/2020.

CLINICAL DATA: Status post pacemaker placement.

EXAM:
CHEST - 2 VIEW

[chest lat]
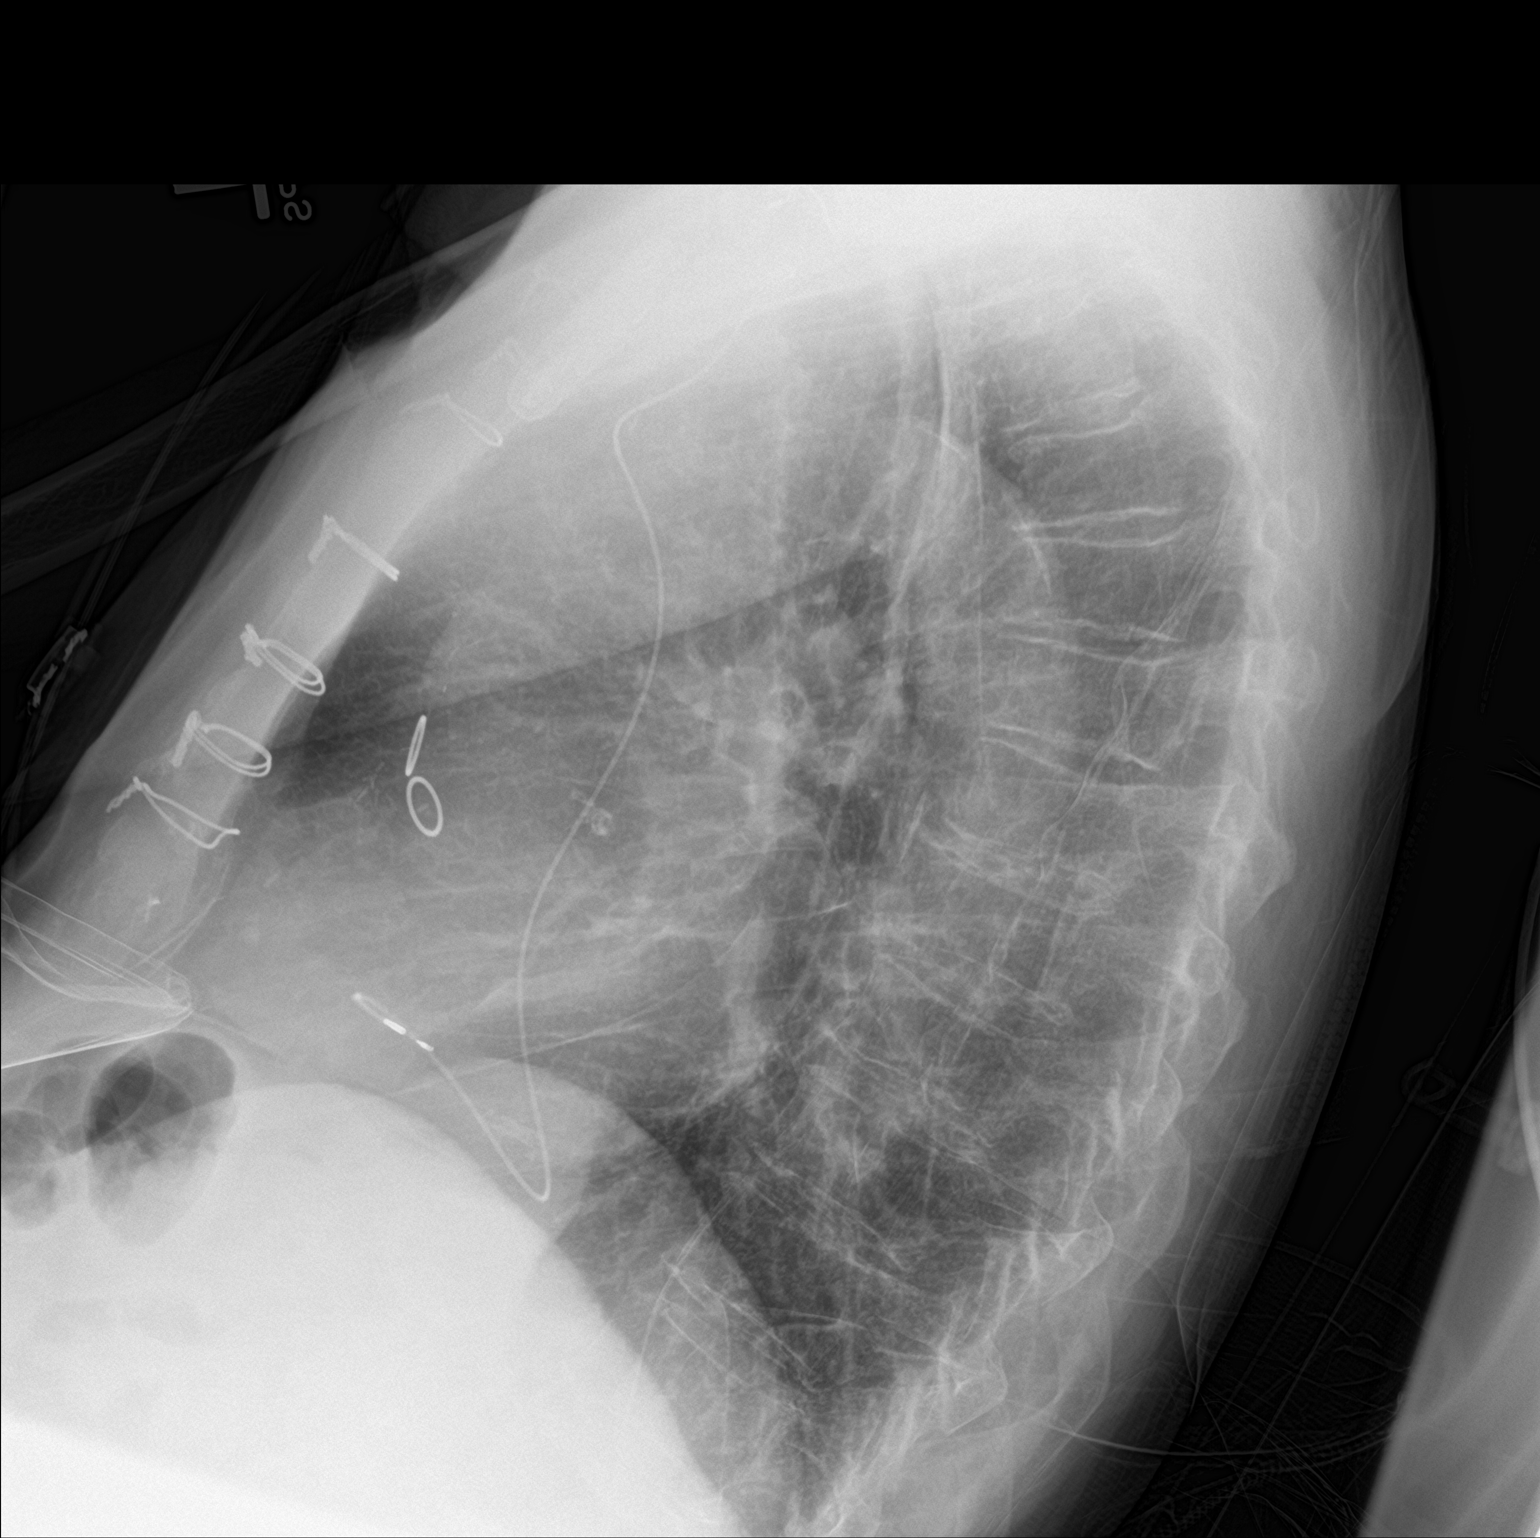

[chest ap]
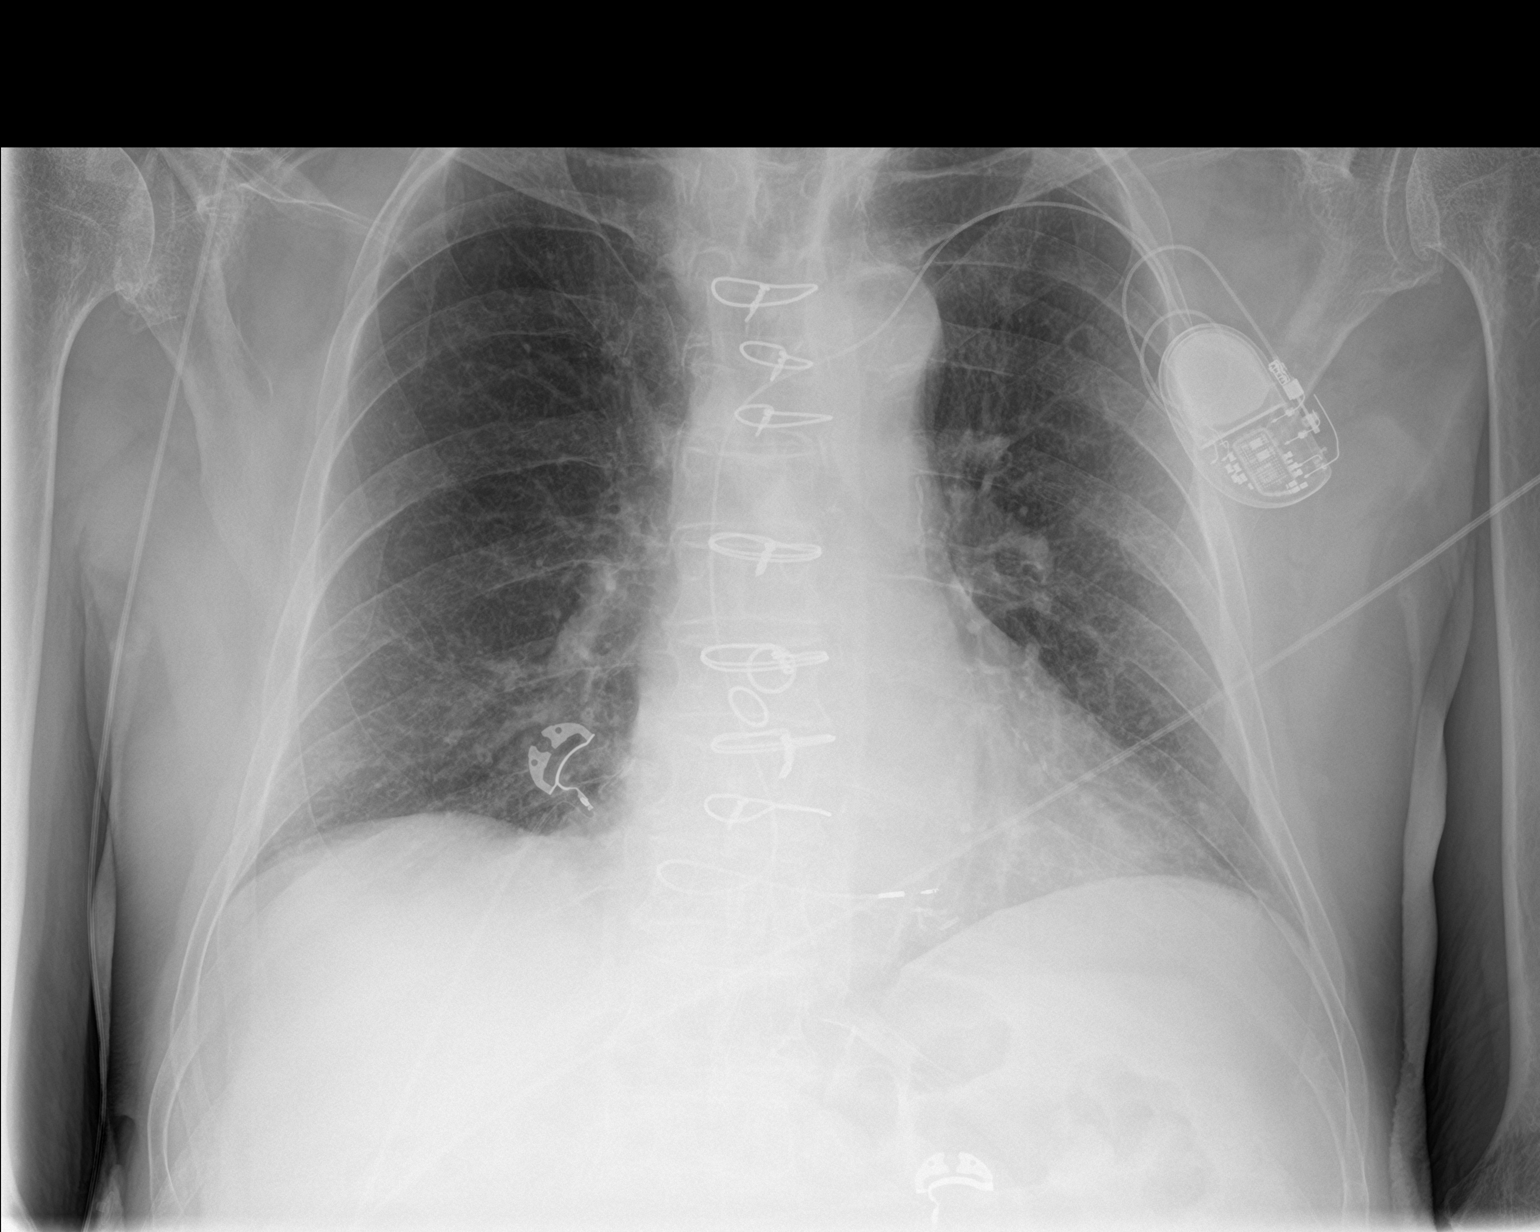

[2 of 2 positions shown; findings below may reference images not displayed]

FINDINGS: Interim placement of cardiac pacer with lead tip over right
ventricle. Prior CABG. Heart size stable. No focal infiltrate. No
pleural effusion or pneumothorax. No acute bony abnormality
identified.
IMPRESSION: 1. Interim placement of cardiac pacer with lead tip over right
ventricle. Prior CABG. Heart size normal.
2. No acute pulmonary disease.  No evidence of pneumothorax.

## 2021-11-12 NOTE — Progress Notes (Signed)
Remote pacemaker transmission.   

## 2022-01-17 DIAGNOSIS — I4891 Unspecified atrial fibrillation: Secondary | ICD-10-CM | POA: Diagnosis not present

## 2022-01-29 ENCOUNTER — Ambulatory Visit (INDEPENDENT_AMBULATORY_CARE_PROVIDER_SITE_OTHER): Payer: Medicare HMO

## 2022-01-29 DIAGNOSIS — I495 Sick sinus syndrome: Secondary | ICD-10-CM | POA: Diagnosis not present

## 2022-01-29 LAB — CUP PACEART REMOTE DEVICE CHECK
Battery Remaining Longevity: 148 mo
Battery Voltage: 3.04 V
Brady Statistic RV Percent Paced: 99.8 %
Date Time Interrogation Session: 20230628040929
Implantable Lead Implant Date: 20211227
Implantable Lead Location: 753860
Implantable Lead Model: 3830
Implantable Pulse Generator Implant Date: 20211227
Lead Channel Impedance Value: 437 Ohm
Lead Channel Impedance Value: 532 Ohm
Lead Channel Pacing Threshold Amplitude: 0.5 V
Lead Channel Pacing Threshold Pulse Width: 0.4 ms
Lead Channel Sensing Intrinsic Amplitude: 20.125 mV
Lead Channel Sensing Intrinsic Amplitude: 20.125 mV
Lead Channel Setting Pacing Amplitude: 2 V
Lead Channel Setting Pacing Pulse Width: 0.4 ms
Lead Channel Setting Sensing Sensitivity: 0.9 mV

## 2022-02-18 NOTE — Progress Notes (Signed)
Remote pacemaker transmission.   

## 2022-04-09 DIAGNOSIS — N342 Other urethritis: Secondary | ICD-10-CM | POA: Diagnosis not present

## 2022-04-09 DIAGNOSIS — R8290 Unspecified abnormal findings in urine: Secondary | ICD-10-CM | POA: Diagnosis not present

## 2022-04-25 DIAGNOSIS — Z1331 Encounter for screening for depression: Secondary | ICD-10-CM | POA: Diagnosis not present

## 2022-04-25 DIAGNOSIS — I4891 Unspecified atrial fibrillation: Secondary | ICD-10-CM | POA: Diagnosis not present

## 2022-04-25 DIAGNOSIS — Z23 Encounter for immunization: Secondary | ICD-10-CM | POA: Diagnosis not present

## 2022-04-25 DIAGNOSIS — Z0000 Encounter for general adult medical examination without abnormal findings: Secondary | ICD-10-CM | POA: Diagnosis not present

## 2022-04-25 DIAGNOSIS — N400 Enlarged prostate without lower urinary tract symptoms: Secondary | ICD-10-CM | POA: Diagnosis not present

## 2022-04-25 DIAGNOSIS — M159 Polyosteoarthritis, unspecified: Secondary | ICD-10-CM | POA: Diagnosis not present

## 2022-04-25 DIAGNOSIS — G729 Myopathy, unspecified: Secondary | ICD-10-CM | POA: Diagnosis not present

## 2022-04-25 DIAGNOSIS — E7800 Pure hypercholesterolemia, unspecified: Secondary | ICD-10-CM | POA: Diagnosis not present

## 2022-04-25 DIAGNOSIS — I25810 Atherosclerosis of coronary artery bypass graft(s) without angina pectoris: Secondary | ICD-10-CM | POA: Diagnosis not present

## 2022-04-25 DIAGNOSIS — R300 Dysuria: Secondary | ICD-10-CM | POA: Diagnosis not present

## 2022-04-25 DIAGNOSIS — I10 Essential (primary) hypertension: Secondary | ICD-10-CM | POA: Diagnosis not present

## 2022-04-30 ENCOUNTER — Ambulatory Visit (INDEPENDENT_AMBULATORY_CARE_PROVIDER_SITE_OTHER): Payer: Medicare HMO

## 2022-04-30 DIAGNOSIS — I495 Sick sinus syndrome: Secondary | ICD-10-CM

## 2022-04-30 LAB — CUP PACEART REMOTE DEVICE CHECK
Battery Remaining Longevity: 146 mo
Battery Voltage: 3.04 V
Brady Statistic RV Percent Paced: 99.76 %
Date Time Interrogation Session: 20230926192005
Implantable Lead Implant Date: 20211227
Implantable Lead Location: 753860
Implantable Lead Model: 3830
Implantable Pulse Generator Implant Date: 20211227
Lead Channel Impedance Value: 437 Ohm
Lead Channel Impedance Value: 551 Ohm
Lead Channel Pacing Threshold Amplitude: 0.625 V
Lead Channel Pacing Threshold Pulse Width: 0.4 ms
Lead Channel Sensing Intrinsic Amplitude: 20.375 mV
Lead Channel Sensing Intrinsic Amplitude: 20.375 mV
Lead Channel Setting Pacing Amplitude: 2 V
Lead Channel Setting Pacing Pulse Width: 0.4 ms
Lead Channel Setting Sensing Sensitivity: 0.9 mV

## 2022-05-08 NOTE — Progress Notes (Signed)
Remote pacemaker transmission.   

## 2022-06-11 ENCOUNTER — Encounter: Payer: Self-pay | Admitting: Cardiology

## 2022-06-11 ENCOUNTER — Ambulatory Visit: Payer: Medicare HMO | Attending: Cardiology | Admitting: Cardiology

## 2022-06-11 VITALS — BP 124/70 | HR 62 | Ht 71.0 in

## 2022-06-11 DIAGNOSIS — D6869 Other thrombophilia: Secondary | ICD-10-CM | POA: Diagnosis not present

## 2022-06-11 DIAGNOSIS — I442 Atrioventricular block, complete: Secondary | ICD-10-CM

## 2022-06-11 DIAGNOSIS — Z79899 Other long term (current) drug therapy: Secondary | ICD-10-CM

## 2022-06-11 DIAGNOSIS — I2510 Atherosclerotic heart disease of native coronary artery without angina pectoris: Secondary | ICD-10-CM

## 2022-06-11 DIAGNOSIS — I4821 Permanent atrial fibrillation: Secondary | ICD-10-CM | POA: Diagnosis not present

## 2022-06-11 LAB — CUP PACEART INCLINIC DEVICE CHECK
Battery Remaining Longevity: 144 mo
Battery Voltage: 3.03 V
Brady Statistic RV Percent Paced: 99.67 %
Date Time Interrogation Session: 20231108143309
Implantable Lead Connection Status: 753985
Implantable Lead Implant Date: 20211227
Implantable Lead Location: 753860
Implantable Lead Model: 3830
Implantable Pulse Generator Implant Date: 20211227
Lead Channel Impedance Value: 456 Ohm
Lead Channel Impedance Value: 551 Ohm
Lead Channel Pacing Threshold Amplitude: 0.5 V
Lead Channel Pacing Threshold Pulse Width: 0.4 ms
Lead Channel Sensing Intrinsic Amplitude: 17.875 mV
Lead Channel Sensing Intrinsic Amplitude: 20.375 mV
Lead Channel Setting Pacing Amplitude: 2 V
Lead Channel Setting Pacing Pulse Width: 0.4 ms
Lead Channel Setting Sensing Sensitivity: 0.9 mV
Zone Setting Status: 755011

## 2022-06-11 NOTE — Progress Notes (Signed)
Electrophysiology Office Note   Date:  06/11/2022   ID:  Cameron Willis, DOB 1933/09/30, MRN [id number]  PCP:  Townsend Roger, MD  Cardiologist:  [place] Primary Electrophysiologist:  Blossom Crume Meredith Leeds, MD    Chief Complaint: pacemaker   History of Present Illness: Cameron Willis is a 88 y.o. male who is being seen today for the evaluation of heart block, pacemaker at the request of Townsend Roger, MD. Presenting today for electrophysiology evaluation.  Significant for atrial fibrillation, COPD, coronary artery disease post CABG, hyperlipidemia, hypertension, tobacco abuse.  He presented to the hospital December 2021 with chest pain was found to have intermittent complete heart block.  He has underlying permanent atrial fibrillation.  He is status post Medtronic pacemaker implanted 07/30/2020.  Today, denies symptoms of palpitations, chest pain, shortness of breath, orthopnea, PND, lower extremity edema, claudication, dizziness, presyncope, syncope, bleeding, or neurologic sequela. The patient is tolerating medications without difficulties.  Since being seen he has done well.  He has had no chest pain or shortness of breath.  He has been able to do all of his daily activities.  He is in a wheelchair.  He has stopped all of his medications.  He has not seen general cardiology recently.   Past Medical History:  Diagnosis Date   Anxiety and depression    Bradycardia by electrocardiogram 01/20/2018   Cervical disc disorder at C4-C5 level with myelopathy 12/05/2016   Chronic atrial fibrillation ([place]) 03/01/2018   Contracture of right knee 12/05/2016   COPD (chronic obstructive pulmonary disease) ([place]) 03/22/2012   Coronary artery disease involving native coronary artery of native heart without angina pectoris 03/22/2012   Overview:  Coronary artery bypass graft August 2013 LIMA to LAD and SVG to RCA and SVG to obtuse marginal   Dyslipidemia 03/26/2015   Esophageal stenosis    Gait disturbance  10/14/2017   GERD (gastroesophageal reflux disease) 03/22/2012   Hemorrhoids    History of degenerative disc disease    HTN (hypertension) 03/22/2012   Hypertrophy of prostate    MI, old 03/22/2012   NSVT (nonsustained ventricular tachycardia) ([place]) 04/05/2020   Osteoarthrosis    Paroxysmal atrial fibrillation ([place]) 03/26/2015   Overview:  Chads score 2, was anticoagulated but developed tarry stool optic admission was withdrawn for now scheduled to see gastroenterologist   Persistent atrial fibrillation ([place]) 01/20/2018   Pre-op evaluation 03/25/2016   S/P CABG x 3 03/25/2012   Tobacco abuse 03/22/2012   Overview:  Quit 2 years ago   Past Surgical History:  Procedure Laterality Date   CARDIAC CATHETERIZATION     COLONOSCOPY  05/07/2015   Colonic polyps status post polypectomy. Internal hemorrhoids ( likely etiology of rectal bleeding)    CORONARY ARTERY BYPASS GRAFT     ESOPHAGOGASTRODUODENOSCOPY  11/06/2005   Smal hiatal hernia. Moderate gastritis   HEMORRHOID SURGERY  1996   PACEMAKER IMPLANT N/A 07/30/2020   Procedure: PACEMAKER IMPLANT;  Surgeon: Evans Lance, MD;  Location: [facility];  Service: Cardiovascular;  Laterality: N/A;     No current outpatient medications on file.   No current facility-administered medications for this visit.    Allergies:   Aciphex [rabeprazole sodium], Lipitor [atorvastatin], Omeprazole, and Pravachol [pravastatin]   Social History:  The patient  reports that he has quit smoking. He has never used smokeless tobacco. He reports that he does not drink alcohol and does not use drugs.   Family History:  The patient's family history  includes Cancer in his father; Esophageal cancer in his father.   ROS:  Please see the history of present illness.   Otherwise, review of systems is positive for none.   All other systems are reviewed and negative.   PHYSICAL EXAM: VS:  BP 124/70   Pulse 62   Ht '5\' 11"'$  (1.803 m)   SpO2 96%   BMI 22.32 kg/m  ,  BMI Body mass index is 22.32 kg/m. GEN: Well nourished, well developed, in no acute distress  HEENT: normal  Neck: no JVD, carotid bruits, or masses Cardiac: RRR; no murmurs, rubs, or gallops,no edema  Respiratory:  clear to auscultation bilaterally, normal work of breathing GI: soft, nontender, nondistended, + BS MS: no deformity or atrophy  Skin: warm and dry, device site well healed Neuro:  Strength and sensation are intact Psych: euthymic mood, full affect  EKG:  EKG is ordered today. Personal review of the ekg ordered shows serial fibrillation, ventricular paced, rate 62  Personal review of the device interrogation today. Results in [place]: No results found for requested labs within last 365 days.    Lipid Panel  No results found for: "CHOL", "TRIG", "HDL", "CHOLHDL", "VLDL", "LDLCALC", "LDLDIRECT"   Wt Readings from Last 3 Encounters:  04/03/21 160 lb (72.6 kg)  07/31/20 154 lb 10.9 oz (70.2 kg)  05/03/20 170 lb (77.1 kg)      Other studies Reviewed: Additional studies/ records that were reviewed today include: TTE 07/29/20  Review of the above records today demonstrates:   1. This was a limited study for evaluation of LVEF that is normal  estimated at 60-65% and unchanged from prior study on 03/15/2020.   2. Left ventricular ejection fraction, by estimation, is 60 to 65%. The  left ventricle has normal function. The left ventricle has no regional  wall motion abnormalities. Left ventricular diastolic function could not  be evaluated.   3. Right ventricular systolic function is normal. The right ventricular  size is normal.   4. Left atrial size was mildly dilated.   5. The mitral valve is grossly normal.   6. The aortic valve is normal in structure.    ASSESSMENT AND PLAN:  1.  Complete heart block: Status post Medtronic dual-chamber pacemaker implanted 07/30/2020.  Device functioning appropriately.  No changes at this time.  2.  Permanent  atrial fibrillation: Not currently anticoagulated.  We Sharnelle Cappelli check blood work and start Eliquis.  CHA2DS2-VASc of at least 4.  3.  Hypertension:Currently well controlled  4.  Coronary artery disease: Status post CABG.  No current chest pain.  Continue with plan per primary cardiology.  5.  Nonsustained VT. on no medications at this time.  6.  Secondary hypercoagulable state: Not anticoagulated.  Plan to start Eliquis.  We Jalin Erpelding arrange for follow-up with general cardiology  Current medicines are reviewed at length with the patient today.   The patient does not have concerns regarding his medicines.  The following changes were made today:    Labs/ tests ordered today include:  Orders Placed This Encounter  Procedures   Lipid Profile   Basic metabolic panel   CBC   CUP PACEART INCLINIC DEVICE CHECK   EKG 12-Lead     Disposition:   FU with Dela Sweeny 12 months  Signed, Mattie Nordell Meredith Leeds, MD  06/11/2022 2:38 PM     [place] [address] [address] [phone] (office) [phone] (fax)

## 2022-06-11 NOTE — Patient Instructions (Signed)
Medication Instructions:  Your physician recommends that you continue on your current medications as directed. Please refer to the Current Medication list given to you today.  *If you need a refill on your cardiac medications before your next appointment, please call your pharmacy*   Lab Work: Fasting lipid lab -- you can stop by the [place] office for this  If you have labs (blood work) drawn today and your tests are completely normal, you will receive your results only by: [place] (if you have MyChart) OR A paper copy in the mail If you have any lab test that is abnormal or we need to change your treatment, we will call you to review the results.   Testing/Procedures: None ordered   Follow-Up: At [facility], you and your health needs are our priority.  As part of our continuing mission to provide you with exceptional heart care, we have created designated Provider Care Teams.  These Care Teams include your primary Cardiologist (physician) and Advanced Practice Providers (APPs -  Physician Assistants and Nurse Practitioners) who all work together to provide you with the care you need, when you need it.  We recommend signing up for the patient portal called "MyChart".  Sign up information is provided on this After Visit Summary.  MyChart is used to connect with patients for Virtual Visits (Telemedicine).  Patients are able to view lab/test results, encounter notes, upcoming appointments, etc.  Non-urgent messages can be sent to your provider as well.   To learn more about what you can do with MyChart, go to [email or web address].    Your next appointment:   3 month(s)  The format for your next appointment:   In Person  Provider:   Jenne Campus, MD   Your physician wants you to follow-up in: 1 year with Dr. Curt Bears in [place]. You will receive a reminder letter in the mail two months in advance. If you don't receive a letter, please call our office to schedule the  follow-up appointment.   Thank you for choosing [facility]!!   Trinidad Curet, RN [phone]  Other Instructions    Important Information About Sugar

## 2022-06-17 ENCOUNTER — Other Ambulatory Visit: Payer: Self-pay

## 2022-06-17 DIAGNOSIS — Z79899 Other long term (current) drug therapy: Secondary | ICD-10-CM | POA: Diagnosis not present

## 2022-06-17 DIAGNOSIS — I2510 Atherosclerotic heart disease of native coronary artery without angina pectoris: Secondary | ICD-10-CM

## 2022-06-17 DIAGNOSIS — I4821 Permanent atrial fibrillation: Secondary | ICD-10-CM | POA: Diagnosis not present

## 2022-06-18 LAB — CBC
Hematocrit: 46.9 % (ref 37.5–51.0)
Hemoglobin: 15.9 g/dL (ref 13.0–17.7)
MCH: 32.4 pg (ref 26.6–33.0)
MCHC: 33.9 g/dL (ref 31.5–35.7)
MCV: 96 fL (ref 79–97)
Platelets: 223 10*3/uL (ref 150–450)
RBC: 4.9 x10E6/uL (ref 4.14–5.80)
RDW: 13.1 % (ref 11.6–15.4)
WBC: 6.5 10*3/uL (ref 3.4–10.8)

## 2022-06-18 LAB — BASIC METABOLIC PANEL
BUN/Creatinine Ratio: 14 (ref 10–24)
BUN: 17 mg/dL (ref 8–27)
CO2: 25 mmol/L (ref 20–29)
Calcium: 9.9 mg/dL (ref 8.6–10.2)
Chloride: 101 mmol/L (ref 96–106)
Creatinine, Ser: 1.23 mg/dL (ref 0.76–1.27)
Glucose: 100 mg/dL — ABNORMAL HIGH (ref 70–99)
Potassium: 5.1 mmol/L (ref 3.5–5.2)
Sodium: 137 mmol/L (ref 134–144)
eGFR: 57 mL/min/{1.73_m2} — ABNORMAL LOW (ref >59)

## 2022-06-18 LAB — LIPID PANEL
Chol/HDL Ratio: 4.5 ratio (ref 0.0–5.0)
Cholesterol, Total: 195 mg/dL (ref 100–199)
HDL: 43 mg/dL (ref >39)
LDL Chol Calc (NIH): 126 mg/dL — ABNORMAL HIGH (ref 0–99)
Triglycerides: 148 mg/dL (ref 0–149)
VLDL Cholesterol Cal: 26 mg/dL (ref 5–40)

## 2022-07-25 ENCOUNTER — Ambulatory Visit: Payer: Medicare HMO | Admitting: Internal Medicine

## 2022-07-25 ENCOUNTER — Encounter: Payer: Self-pay | Admitting: Internal Medicine

## 2022-07-25 VITALS — BP 128/82 | HR 62 | Temp 97.3°F | Resp 16 | Ht 71.0 in

## 2022-07-25 DIAGNOSIS — I10 Essential (primary) hypertension: Secondary | ICD-10-CM | POA: Diagnosis not present

## 2022-07-25 DIAGNOSIS — I482 Chronic atrial fibrillation, unspecified: Secondary | ICD-10-CM

## 2022-07-25 NOTE — Assessment & Plan Note (Signed)
I reivewed his notes from EP.  I had a discussion with him regarding his eliquis.  I want him to be on this but he still remains resistant.  He will think about it.  His PM is working well.

## 2022-07-25 NOTE — Progress Notes (Signed)
Office Visit  Subjective   Patient ID: Cameron Willis   DOB: 19-Feb-1934   Age: 88 y.o.   MRN: [id number]   Chief Complaint Chief Complaint  Patient presents with   Follow-up     History of Present Illness We saw Cameron Willis this past year and had multiple discussions regarding his meds where Cameron Willis stopped all his meds this past year in 10/2021.  He understands the risks and benefits.  I saw him 3 months ago and we discussed his A. Fib.  He had not seen cardiology in over a year and had not had any recent interrogation of his PM.  I sent him to go see Dr. Curt Willis in EP and he saw him on 06/11/2022.  They checked his device and it was functioning appropriately.  He discussed Eliquis (I told the patient he needed to be on this) but the patient has decided he does not want to be on this.  He does have moderately severe atrial fibrillation of about 10 years known duration and presents today for a status visit.   Again, he was admitted to [facility] on 07/27/2020 with chest pain.  They felt he had some angina which could have been exacerbated by sick sinus syndrome where his pulse was dropping down to the 30's.  They transferred him for pacemaker placement.   He had a pacemaker placed on 07/30/2020.  Again, he was hospitalized with the flu and A. Fib in 10/2017.  AV nodal blockade was deferred due to the patient having bradycardia as well as a Type I Mobitz AV block.  They calculated his CHAD2VASC score to be 3 and the patient was started on eliquis.  They did perform an ECHO in 10/2016 and this showed a normal LVEF of 55-60% with asymmetric apical hypertropthy.  He had a repeat ECHO done in 07/29/2020 which showed a normal LVEF 60-65% with normal LV function and no regional wall motion abnormalities and normal right ventricular function.  His left atrium was mildly dilated.   His atrial fibrillation is controlled with therapy as summarized in the medication list and previous notes. Specifically denied complaints:  chest pain, palpitations, TIAs, edema, exertional dyspnea, and syncope. Interval history: see note from consulting physician. He is followed by cardiology with his last visit with electrophysiology in 11/05/2020.  They felt he had a normal interrogation.  He was on amiodarone for a history of NSVT.      The patient is a 88 year old Caucasian/White male who presents for a follow-up evaluation of hypertension. The patient has not been checking his blood pressure at home.   The patient's current medications include: no medications. The patient denies any headache, visual changes, dizziness, lightheadness, chest pain, shortness of breath, weakness/numbness, and edema.  He reports there have been no other symptoms noted.       Past Medical History Past Medical History:  Diagnosis Date   Anxiety and depression    Bradycardia by electrocardiogram 01/20/2018   Cervical disc disorder at C4-C5 level with myelopathy 12/05/2016   Chronic atrial fibrillation ([place]) 03/01/2018   Contracture of right knee 12/05/2016   COPD (chronic obstructive pulmonary disease) ([place]) 03/22/2012   Coronary artery disease involving native coronary artery of native heart without angina pectoris 03/22/2012   Overview:  Coronary artery bypass graft August 2013 LIMA to LAD and SVG to RCA and SVG to obtuse marginal   Dyslipidemia 03/26/2015   Esophageal stenosis    Gait disturbance 10/14/2017  GERD (gastroesophageal reflux disease) 03/22/2012   Hemorrhoids    History of degenerative disc disease    HTN (hypertension) 03/22/2012   Hypertrophy of prostate    MI, old 03/22/2012   NSVT (nonsustained ventricular tachycardia) ([place]) 04/05/2020   Osteoarthrosis    Paroxysmal atrial fibrillation ([place]) 03/26/2015   Overview:  Chads score 2, was anticoagulated but developed tarry stool optic admission was withdrawn for now scheduled to see gastroenterologist   Persistent atrial fibrillation ([place]) 01/20/2018   Pre-op evaluation 03/25/2016   S/P CABG x  3 03/25/2012   Tobacco abuse 03/22/2012   Overview:  Quit 2 years ago     Allergies Allergies  Allergen Reactions   Aciphex [Rabeprazole Sodium] Hives   Lipitor [Atorvastatin]    Omeprazole Hives   Pravachol [Pravastatin]      Review of Systems Review of Systems  Constitutional:  Negative for chills and fever.  Eyes:  Negative for blurred vision and double vision.  Respiratory:  Negative for cough and shortness of breath.   Cardiovascular:  Negative for chest pain, palpitations and leg swelling.  Gastrointestinal:  Negative for constipation, diarrhea, nausea and vomiting.  Skin:  Negative for itching and rash.       Objective:    Vitals BP 128/82   Pulse 62   Temp (!) 97.3 F (36.3 C)   Resp 16   Ht '5\' 11"'$  (1.803 m)   SpO2 95%   BMI 22.32 kg/m    Physical Examination Physical Exam Constitutional:      Appearance: Normal appearance. He is not ill-appearing.  Cardiovascular:     Rate and Rhythm: Normal rate and regular rhythm.     Pulses: Normal pulses.     Heart sounds: No murmur heard.    No friction rub. No gallop.  Pulmonary:     Effort: Pulmonary effort is normal. No respiratory distress.     Breath sounds: No wheezing, rhonchi or rales.  Abdominal:     General: Abdomen is flat. Bowel sounds are normal. There is no distension.     Palpations: Abdomen is soft.     Tenderness: There is no abdominal tenderness.  Musculoskeletal:     Right lower leg: No edema.     Left lower leg: No edema.  Skin:    General: Skin is warm and dry.     Findings: No rash.  Neurological:     Mental Status: He is alert.        Assessment & Plan:   Chronic atrial fibrillation ([place]) I reivewed his notes from EP.  I had a discussion with him regarding his eliquis.  I want him to be on this but he still remains resistant.  He will think about it.  His PM is working well.  Essential hypertension His BP is controlled without meds at this time.    Return in about 3 months  (around 10/24/2022).   Townsend Roger, MD

## 2022-07-25 NOTE — Assessment & Plan Note (Signed)
His BP is controlled without meds at this time.

## 2022-07-30 ENCOUNTER — Ambulatory Visit (INDEPENDENT_AMBULATORY_CARE_PROVIDER_SITE_OTHER): Payer: Medicare HMO

## 2022-07-30 DIAGNOSIS — I442 Atrioventricular block, complete: Secondary | ICD-10-CM

## 2022-07-30 LAB — CUP PACEART REMOTE DEVICE CHECK
Battery Remaining Longevity: 142 mo
Battery Voltage: 3.03 V
Brady Statistic RV Percent Paced: 99.59 %
Date Time Interrogation Session: 20231226181431
Implantable Lead Connection Status: 753985
Implantable Lead Implant Date: 20211227
Implantable Lead Location: 753860
Implantable Lead Model: 3830
Implantable Pulse Generator Implant Date: 20211227
Lead Channel Impedance Value: 437 Ohm
Lead Channel Impedance Value: 532 Ohm
Lead Channel Pacing Threshold Amplitude: 0.625 V
Lead Channel Pacing Threshold Pulse Width: 0.4 ms
Lead Channel Sensing Intrinsic Amplitude: 22.875 mV
Lead Channel Sensing Intrinsic Amplitude: 22.875 mV
Lead Channel Setting Pacing Amplitude: 2 V
Lead Channel Setting Pacing Pulse Width: 0.4 ms
Lead Channel Setting Sensing Sensitivity: 0.9 mV
Zone Setting Status: 755011

## 2022-08-15 ENCOUNTER — Ambulatory Visit: Payer: Medicare HMO | Admitting: Internal Medicine

## 2022-08-15 ENCOUNTER — Encounter: Payer: Self-pay | Admitting: Internal Medicine

## 2022-08-15 VITALS — BP 150/88 | HR 68 | Temp 97.8°F | Resp 16 | Ht 71.0 in

## 2022-08-15 DIAGNOSIS — R531 Weakness: Secondary | ICD-10-CM | POA: Diagnosis not present

## 2022-08-15 DIAGNOSIS — R1032 Left lower quadrant pain: Secondary | ICD-10-CM | POA: Diagnosis not present

## 2022-08-15 DIAGNOSIS — Z5321 Procedure and treatment not carried out due to patient leaving prior to being seen by health care provider: Secondary | ICD-10-CM | POA: Diagnosis not present

## 2022-08-15 DIAGNOSIS — K5939 Other megacolon: Secondary | ICD-10-CM | POA: Diagnosis not present

## 2022-08-15 DIAGNOSIS — J449 Chronic obstructive pulmonary disease, unspecified: Secondary | ICD-10-CM | POA: Diagnosis not present

## 2022-08-15 DIAGNOSIS — R109 Unspecified abdominal pain: Secondary | ICD-10-CM | POA: Diagnosis not present

## 2022-08-15 DIAGNOSIS — I10 Essential (primary) hypertension: Secondary | ICD-10-CM | POA: Diagnosis not present

## 2022-08-15 MED ORDER — CIPROFLOXACIN HCL 500 MG PO TABS: 500.0000 mg | ORAL_TABLET | Freq: Two times a day (BID) | ORAL | 0 refills | Status: AC

## 2022-08-15 MED ORDER — METRONIDAZOLE 500 MG PO TABS: 500.0000 mg | ORAL_TABLET | Freq: Three times a day (TID) | ORAL | 0 refills | Status: AC

## 2022-08-15 NOTE — Progress Notes (Signed)
Office Visit  Subjective   Patient ID: Cameron Willis   DOB: 08/18/1933   Age: 89 y.o.   MRN: [id number]   Chief Complaint Chief Complaint  Patient presents with   Acute Visit    Pain with urination     History of Present Illness The patient comes in today with acute complaints of left flank pain that occureed early this morning.  He states this is a sharp pain that will occurring and is a severe pain that will last about 30 seconds then go away.  If comes in waves and since this morning, it has improved.  He denies any f/c, nausea, vomiting, dysuria, hematuria, urinary frequency, and his last BM was yesterday and was near normal.  He has not had any BRBPR or melena.  He went to the ER this morning and waited about 6 hours but they never did see him and he started to improve and he left the ER.     Past Medical History Past Medical History:  Diagnosis Date   Anxiety and depression    Bradycardia by electrocardiogram 01/20/2018   Cervical disc disorder at C4-C5 level with myelopathy 12/05/2016   Chronic atrial fibrillation ([place]) 03/01/2018   Contracture of right knee 12/05/2016   COPD (chronic obstructive pulmonary disease) ([place]) 03/22/2012   Coronary artery disease involving native coronary artery of native heart without angina pectoris 03/22/2012   Overview:  Coronary artery bypass graft August 2013 LIMA to LAD and SVG to RCA and SVG to obtuse marginal   Dyslipidemia 03/26/2015   Esophageal stenosis    Gait disturbance 10/14/2017   GERD (gastroesophageal reflux disease) 03/22/2012   Hemorrhoids    History of degenerative disc disease    HTN (hypertension) 03/22/2012   Hypertrophy of prostate    MI, old 03/22/2012   NSVT (nonsustained ventricular tachycardia) ([place]) 04/05/2020   Osteoarthrosis    Paroxysmal atrial fibrillation ([place]) 03/26/2015   Overview:  Chads score 2, was anticoagulated but developed tarry stool optic admission was withdrawn for now scheduled to see gastroenterologist    Persistent atrial fibrillation ([place]) 01/20/2018   Pre-op evaluation 03/25/2016   S/P CABG x 3 03/25/2012   Tobacco abuse 03/22/2012   Overview:  Quit 2 years ago     Allergies Allergies  Allergen Reactions   Aciphex [Rabeprazole Sodium] Hives   Lipitor [Atorvastatin]    Omeprazole Hives   Pravachol [Pravastatin]      Medications  Current Outpatient Medications:    ciprofloxacin (CIPRO) 500 MG tablet, Take 1 tablet (500 mg total) by mouth 2 (two) times daily for 10 days., Disp: 20 tablet, Rfl: 0   metroNIDAZOLE (FLAGYL) 500 MG tablet, Take 1 tablet (500 mg total) by mouth 3 (three) times daily for 10 days., Disp: 30 tablet, Rfl: 0   Review of Systems Review of Systems  Constitutional:  Negative for chills and fever.  Respiratory:  Negative for cough, shortness of breath and wheezing.   Cardiovascular:  Negative for chest pain, palpitations and leg swelling.  Gastrointestinal: Negative.  Negative for blood in stool, constipation, diarrhea, melena, nausea and vomiting.       Left flank pain  Genitourinary:  Positive for flank pain. Negative for dysuria, frequency, hematuria and urgency.  Musculoskeletal:  Negative for myalgias.  Skin:  Negative for itching and rash.  Neurological:  Negative for weakness and headaches.       Objective:    Vitals BP (!) 150/88   Pulse 68   Temp  97.8 F (36.6 C)   Resp 16   Ht '5\' 11"'$  (1.803 m)   SpO2 98%   BMI 22.32 kg/m    Physical Examination Physical Exam Constitutional:      Appearance: Normal appearance. He is not ill-appearing.  Cardiovascular:     Rate and Rhythm: Normal rate and regular rhythm.     Pulses: Normal pulses.     Heart sounds: No murmur heard.    No friction rub. No gallop.  Pulmonary:     Effort: Pulmonary effort is normal. No respiratory distress.     Breath sounds: No wheezing, rhonchi or rales.  Abdominal:     General: Abdomen is flat. Bowel sounds are normal. There is no distension.     Palpations: Abdomen  is soft.     Tenderness: There is abdominal tenderness.  Musculoskeletal:     Right lower leg: No edema.     Left lower leg: No edema.  Neurological:     Mental Status: He is alert.        Assessment & Plan:   Left lower quadrant abdominal pain He has tenderness in his left lower quadrant of his abdomen.  I do not think this is a kidney stone.  I wonder whether he could have diverticulitis or other etiology. We will get a CBC, CMP, UA and get an abdominal xray.  We will start him on empiric cipro and flagul for diverticulitis.  Due to it being 16:00 on a Friday afternoon, I have told him if he worsens, he is to go back to the ER.    No follow-ups on file.   Townsend Roger, MD

## 2022-08-15 NOTE — Assessment & Plan Note (Signed)
He has tenderness in his left lower quadrant of his abdomen.  I do not think this is a kidney stone.  I wonder whether he could have diverticulitis or other etiology. We will get a CBC, CMP, UA and get an abdominal xray.  We will start him on empiric cipro and flagul for diverticulitis.  Due to it being 16:00 on a Friday afternoon, I have told him if he worsens, he is to go back to the ER.

## 2022-08-17 DIAGNOSIS — I700 Atherosclerosis of aorta: Secondary | ICD-10-CM | POA: Diagnosis not present

## 2022-08-17 DIAGNOSIS — R9431 Abnormal electrocardiogram [ECG] [EKG]: Secondary | ICD-10-CM | POA: Diagnosis not present

## 2022-08-17 DIAGNOSIS — I723 Aneurysm of iliac artery: Secondary | ICD-10-CM | POA: Diagnosis not present

## 2022-08-17 DIAGNOSIS — Z79899 Other long term (current) drug therapy: Secondary | ICD-10-CM | POA: Diagnosis not present

## 2022-08-17 DIAGNOSIS — Z792 Long term (current) use of antibiotics: Secondary | ICD-10-CM | POA: Diagnosis not present

## 2022-08-17 DIAGNOSIS — R1110 Vomiting, unspecified: Secondary | ICD-10-CM | POA: Diagnosis not present

## 2022-08-17 DIAGNOSIS — R109 Unspecified abdominal pain: Secondary | ICD-10-CM | POA: Diagnosis not present

## 2022-08-17 DIAGNOSIS — N3090 Cystitis, unspecified without hematuria: Secondary | ICD-10-CM | POA: Diagnosis not present

## 2022-08-17 DIAGNOSIS — R112 Nausea with vomiting, unspecified: Secondary | ICD-10-CM | POA: Diagnosis not present

## 2022-08-19 NOTE — Progress Notes (Signed)
Remote pacemaker transmission.   

## 2022-08-22 ENCOUNTER — Encounter: Payer: Self-pay | Admitting: Internal Medicine

## 2022-08-22 ENCOUNTER — Ambulatory Visit: Payer: Medicare HMO | Admitting: Internal Medicine

## 2022-08-22 VITALS — BP 128/70 | HR 68 | Temp 97.1°F | Resp 16 | Ht 71.0 in

## 2022-08-22 DIAGNOSIS — R109 Unspecified abdominal pain: Secondary | ICD-10-CM | POA: Diagnosis not present

## 2022-08-22 DIAGNOSIS — R1032 Left lower quadrant pain: Secondary | ICD-10-CM

## 2022-08-22 NOTE — Progress Notes (Signed)
Office Visit  Subjective   Patient ID: Cameron Willis   DOB: [birth year]   Age: 89 y.o.   MRN: [id number]   Chief Complaint Chief Complaint  Patient presents with   Follow-up     History of Present Illness Cameron Willis is a 89 yo male who returns today for followup of his abdominal pain.  I saw him a week ago where we obtained labs and a plain film xray of his abdomen that showed dilated loops of bowel where he had a possible ileus.  At that time he was just having left lower quadrant abdominal pain but no nausea or vomiting and he was having bowel movements.  I placed him on empiric antibiotics for possible diverticulitis with cipro and flagyl. His pain worsened over the weekend where he went into the ER on 08/17/2022.  They repeated his labs and performed a CT scan of his abd/pelvis to evaluate for obstruction and this showed some mild bladder wall thickening but no evidence of obstruction or inflammatory process. The patient states he stopped the antibiotics and the ER gave him zofran and dicyclomine as he had one episode of vomiting on 08/17/2022.  His pain worsened the next day on 08/18/2022 but he did not go back to the ER.  Today, he states that over the last 4 days, his pain has improved.  He is passing gas but he has not had a bowel movement now for 5 days.  The patient is eating little and he has not had any vomiting over the last 5 days.  His pain is still located in his left lower quadrant without radiation.  He did take miralax x 1 yesterday but has not had a bowel movement.     Past Medical History Past Medical History:  Diagnosis Date   Anxiety and depression    Bradycardia by electrocardiogram 01/20/2018   Cervical disc disorder at C4-C5 level with myelopathy 12/05/2016   Chronic atrial fibrillation ([place]) 03/01/2018   Contracture of right knee 12/05/2016   COPD (chronic obstructive pulmonary disease) ([place]) 03/22/2012   Coronary artery disease involving native coronary artery of native  heart without angina pectoris 03/22/2012   Overview:  Coronary artery bypass graft August 2013 LIMA to LAD and SVG to RCA and SVG to obtuse marginal   Dyslipidemia 03/26/2015   Esophageal stenosis    Gait disturbance 10/14/2017   GERD (gastroesophageal reflux disease) 03/22/2012   Hemorrhoids    History of degenerative disc disease    HTN (hypertension) 03/22/2012   Hypertrophy of prostate    MI, old 03/22/2012   NSVT (nonsustained ventricular tachycardia) ([place]) 04/05/2020   Osteoarthrosis    Paroxysmal atrial fibrillation ([place]) 03/26/2015   Overview:  Chads score 2, was anticoagulated but developed tarry stool optic admission was withdrawn for now scheduled to see gastroenterologist   Persistent atrial fibrillation ([place]) 01/20/2018   Pre-op evaluation 03/25/2016   S/P CABG x 3 03/25/2012   Tobacco abuse 03/22/2012   Overview:  Quit 2 years ago     Allergies Allergies  Allergen Reactions   Aciphex [Rabeprazole Sodium] Hives   Lipitor [Atorvastatin]    Omeprazole Hives   Pravachol [Pravastatin]      Medications  Current Outpatient Medications:    ciprofloxacin (CIPRO) 500 MG tablet, Take 1 tablet (500 mg total) by mouth 2 (two) times daily for 10 days., Disp: 20 tablet, Rfl: 0   metroNIDAZOLE (FLAGYL) 500 MG tablet, Take 1 tablet (500 mg total) by mouth 3 (  three) times daily for 10 days., Disp: 30 tablet, Rfl: 0   Review of Systems Review of Systems  Constitutional:  Negative for chills and fever.  Respiratory:  Negative for cough, shortness of breath and wheezing.   Cardiovascular:  Negative for chest pain, palpitations and leg swelling.  Gastrointestinal:  Positive for abdominal pain. Negative for blood in stool, constipation, diarrhea, heartburn, melena, nausea and vomiting.  Genitourinary:  Negative for dysuria and hematuria.  Neurological:  Negative for dizziness, weakness and headaches.       Objective:    Vitals BP 128/70   Pulse 68   Temp (!) 97.1 F (36.2 C)   Resp  16   Ht '5\' 11"'$  (1.803 m)   SpO2 92%   BMI 22.32 kg/m    Physical Examination Physical Exam Constitutional:      Appearance: Normal appearance. He is not ill-appearing.  Cardiovascular:     Rate and Rhythm: Normal rate and regular rhythm.     Pulses: Normal pulses.     Heart sounds: No murmur heard.    No friction rub. No gallop.  Pulmonary:     Effort: Pulmonary effort is normal. No respiratory distress.     Breath sounds: No wheezing, rhonchi or rales.  Abdominal:     General: Abdomen is flat. Bowel sounds are normal. There is no distension.     Palpations: Abdomen is soft.     Tenderness: There is abdominal tenderness.     Comments: He has left lower quadrant abdominal pain but no guarding.  Musculoskeletal:     Right lower leg: No edema.     Left lower leg: No edema.  Skin:    General: Skin is warm and dry.     Findings: No rash.  Neurological:     Mental Status: He is alert.        Assessment & Plan:   Left lower quadrant abdominal pain He is still having abdominal pain where I am going to obtain another plain film xray to see if he has constipation.  If his pain worsens, he needs to go back to the ER.  If his abdominal xray is normal, we will refer him to GI.    No follow-ups on file.   Townsend Roger, MD

## 2022-08-22 NOTE — Assessment & Plan Note (Signed)
He is still having abdominal pain where I am going to obtain another plain film xray to see if he has constipation.  If his pain worsens, he needs to go back to the ER.  If his abdominal xray is normal, we will refer him to GI.

## 2022-09-11 ENCOUNTER — Ambulatory Visit: Payer: Medicare HMO | Attending: Cardiology | Admitting: Cardiology

## 2022-09-15 ENCOUNTER — Ambulatory Visit: Payer: Medicare HMO | Admitting: Internal Medicine

## 2022-09-15 ENCOUNTER — Encounter: Payer: Self-pay | Admitting: Internal Medicine

## 2022-09-15 ENCOUNTER — Encounter: Payer: Self-pay | Admitting: Cardiology

## 2022-09-15 VITALS — BP 120/80 | HR 64 | Temp 97.2°F | Resp 16 | Ht 71.0 in

## 2022-09-15 DIAGNOSIS — R300 Dysuria: Secondary | ICD-10-CM

## 2022-09-15 DIAGNOSIS — K5900 Constipation, unspecified: Secondary | ICD-10-CM | POA: Insufficient documentation

## 2022-09-15 LAB — POCT URINALYSIS DIPSTICK
Appearance: ABNORMAL
Bilirubin, UA: NEGATIVE
Glucose, UA: NEGATIVE
Ketones, UA: NEGATIVE
Nitrite, UA: POSITIVE
Protein, UA: POSITIVE — AB
Spec Grav, UA: >=1.03 — AB (ref 1.010–1.025)
Urobilinogen, UA: 0.2 E.U./dL
pH, UA: 5.5 (ref 5.0–8.0)

## 2022-09-15 MED ORDER — CIPROFLOXACIN HCL 500 MG PO TABS: 500.0000 mg | ORAL_TABLET | Freq: Two times a day (BID) | ORAL | 0 refills | Status: DC

## 2022-09-15 NOTE — Assessment & Plan Note (Signed)
They noted several weeks ago that his bladder wall had some thickening on CT scan when he was in the ER.  We will check a UA today.

## 2022-09-15 NOTE — Assessment & Plan Note (Signed)
I want him to start taking benefiber every day and if has problems with hard stools, he is to take colace as needed.  If he has not had a bowel movement in 24 hours, he is to take his miralax.  He has an appointment set up with GI.

## 2022-09-15 NOTE — Progress Notes (Signed)
Office Visit  Subjective   Patient ID: Cameron Willis   DOB: 1934-07-29   Age: 89 y.o.   MRN: [id number]   Chief Complaint Chief Complaint  Patient presents with   Acute Visit    dizziness     History of Present Illness Cameron Willis is a 89 yo male who returns today for followup of his abdominal pain.  The patient did call last Friday, where the night before he was having worsening left lower quadrant pain and then he was having pain across his entire stomach.  He also states he is having dysuria and feels like he has to urinate every 10-15 minutes.  I saw several weeks ago where we obtained labs and a plain film xray of his abdomen that showed dilated loops of bowel where he had a possible ileus.  At that time he was just having left lower quadrant abdominal pain but no nausea or vomiting and he was having bowel movements.  I placed him on empiric antibiotics for possible diverticulitis with cipro and flagyl. His pain worsened where he went into the ER on 08/17/2022.  They repeated his labs and performed a CT scan of his abd/pelvis to evaluate for obstruction and this showed some mild bladder wall thickening but no evidence of obstruction or inflammatory process. The patient states he stopped the antibiotics and the ER gave him zofran and dicyclomine as he had one episode of vomiting on 08/17/2022.  His pain worsened the next day on 08/18/2022 but he did not go back to the ER.   He is passing gas but he has not had a bowel movement now for 3 days.  Normally he has a bowel movement every other day.  The patient is eating and not having any nausea or vomiting.           Past Medical History Past Medical History:  Diagnosis Date   Anxiety and depression    Bradycardia by electrocardiogram 01/20/2018   Cervical disc disorder at C4-C5 level with myelopathy 12/05/2016   Chronic atrial fibrillation ([place]) 03/01/2018   Contracture of right knee 12/05/2016   COPD (chronic obstructive pulmonary disease) ([place])  03/22/2012   Coronary artery disease involving native coronary artery of native heart without angina pectoris 03/22/2012   Overview:  Coronary artery bypass graft August 2013 LIMA to LAD and SVG to RCA and SVG to obtuse marginal   Dyslipidemia 03/26/2015   Esophageal stenosis    Gait disturbance 10/14/2017   GERD (gastroesophageal reflux disease) 03/22/2012   Hemorrhoids    History of degenerative disc disease    HTN (hypertension) 03/22/2012   Hypertrophy of prostate    MI, old 03/22/2012   NSVT (nonsustained ventricular tachycardia) ([place]) 04/05/2020   Osteoarthrosis    Paroxysmal atrial fibrillation ([place]) 03/26/2015   Overview:  Chads score 2, was anticoagulated but developed tarry stool optic admission was withdrawn for now scheduled to see gastroenterologist   Persistent atrial fibrillation ([place]) 01/20/2018   Pre-op evaluation 03/25/2016   S/P CABG x 3 03/25/2012   Tobacco abuse 03/22/2012   Overview:  Quit 2 years ago     Allergies Allergies  Allergen Reactions   Aciphex [Rabeprazole Sodium] Hives   Lipitor [Atorvastatin]    Omeprazole Hives   Pravachol [Pravastatin]      Medications No current outpatient medications on file.   Review of Systems Review of Systems  Constitutional:  Negative for chills and fever.  Respiratory:  Negative for cough and shortness of  breath.   Cardiovascular:  Negative for chest pain and palpitations.  Gastrointestinal:  Positive for abdominal pain and constipation. Negative for blood in stool, diarrhea, melena, nausea and vomiting.  Genitourinary:  Positive for dysuria, frequency and urgency. Negative for hematuria.  Musculoskeletal:  Negative for myalgias.  Neurological:  Positive for dizziness. Negative for weakness and headaches.       Objective:    Vitals BP 120/80   Pulse 64   Temp (!) 97.2 F (36.2 C)   Resp 16   Ht '5\' 11"'$  (1.803 m)   SpO2 99%   BMI 22.32 kg/m    Physical Examination Physical Exam Constitutional:       Appearance: Normal appearance. He is not ill-appearing.  Cardiovascular:     Rate and Rhythm: Normal rate and regular rhythm.     Pulses: Normal pulses.     Heart sounds: No murmur heard.    No friction rub. No gallop.  Pulmonary:     Effort: Pulmonary effort is normal. No respiratory distress.     Breath sounds: No wheezing, rhonchi or rales.  Abdominal:     General: Abdomen is flat. Bowel sounds are normal. There is no distension.     Palpations: Abdomen is soft.     Tenderness: There is no abdominal tenderness.     Comments: He has some mild tenderness in his Left lower quadrant.  Musculoskeletal:     Right lower leg: No edema.     Left lower leg: No edema.  Skin:    General: Skin is warm and dry.     Findings: No rash.  Neurological:     Mental Status: He is alert.        Assessment & Plan:   Constipation I want him to start taking benefiber every day and if has problems with hard stools, he is to take colace as needed.  If he has not had a bowel movement in 24 hours, he is to take his miralax.  He has an appointment set up with GI.  Dysuria They noted several weeks ago that his bladder wall had some thickening on CT scan when he was in the ER.  We will check a UA today.    No follow-ups on file.   Townsend Roger, MD

## 2022-09-18 LAB — URINE CULTURE

## 2022-09-29 ENCOUNTER — Other Ambulatory Visit: Payer: Self-pay

## 2022-09-29 MED ORDER — CEFUROXIME AXETIL 250 MG PO TABS
250.0000 mg | ORAL_TABLET | Freq: Two times a day (BID) | ORAL | 0 refills | Status: DC
Start: 2022-09-29 — End: 2022-10-08

## 2022-10-08 ENCOUNTER — Other Ambulatory Visit: Payer: Self-pay

## 2022-10-08 MED ORDER — AMOXICILLIN-POT CLAVULANATE 875-125 MG PO TABS
1.0000 | ORAL_TABLET | Freq: Two times a day (BID) | ORAL | 0 refills | Status: DC
Start: 2022-10-08 — End: 2023-01-26

## 2022-10-08 NOTE — Progress Notes (Signed)
Pt called in stating he is still having urinary symptoms. Per Dr. Nona Dell according to culture we will send in Augmentin 875 BID x10 days. Pt notified.

## 2022-10-16 ENCOUNTER — Encounter: Payer: Self-pay | Admitting: Internal Medicine

## 2022-10-16 ENCOUNTER — Ambulatory Visit: Payer: Medicare HMO | Admitting: Internal Medicine

## 2022-10-16 VITALS — BP 138/70 | HR 61 | Temp 97.2°F | Resp 16

## 2022-10-16 DIAGNOSIS — R410 Disorientation, unspecified: Secondary | ICD-10-CM | POA: Diagnosis not present

## 2022-10-16 DIAGNOSIS — R300 Dysuria: Secondary | ICD-10-CM | POA: Diagnosis not present

## 2022-10-16 LAB — POCT URINALYSIS DIPSTICK
Bilirubin, UA: NEGATIVE
Blood, UA: POSITIVE
Glucose, UA: NEGATIVE
Ketones, UA: NEGATIVE
Nitrite, UA: NEGATIVE
Protein, UA: POSITIVE — AB
Spec Grav, UA: 1.025 (ref 1.010–1.025)
Urobilinogen, UA: 0.2 E.U./dL
pH, UA: 7 (ref 5.0–8.0)

## 2022-10-16 MED ORDER — CIPROFLOXACIN HCL 500 MG PO TABS: 500.0000 mg | ORAL_TABLET | Freq: Two times a day (BID) | ORAL | 0 refills | Status: DC

## 2022-10-16 NOTE — Assessment & Plan Note (Signed)
We did a UA on him today which was trace LE.  I am going to start him on empiric cipro at this time.  They noted some bladder wall thickening where I am going to refer him to urology.

## 2022-10-16 NOTE — Addendum Note (Signed)
Addended byRico Sheehan on: 10/16/2022 02:47 PM   Modules accepted: Orders

## 2022-10-16 NOTE — Assessment & Plan Note (Signed)
The patient was confused last night but is completely back to baseline today.  We will treat his urine as below.  I will also check a CBC and CMP.  If he worsens, I have asked him to go to the ER.

## 2022-10-16 NOTE — Progress Notes (Signed)
Office Visit  Subjective   Patient ID: Cameron Willis   DOB: Feb 13, 1934   Age: 89 y.o.   MRN: [id number]   Chief Complaint Chief Complaint  Patient presents with   Acute Visit    Pain in abdomen     History of Present Illness Cameron Willis is a 89 yo male who returns today for an acute visit for some confusion.  His son states that he got confused last night.  This has happened some over the last few weeks.  I saw him a month ago where he has been having some abd pain/flank pain.  We saw him several times in 08/2022 where at that time I placed him on empiric antibiotics for possible diverticulitis.  He states this did help but he started having symptoms yesterday of dysuria without hematuria.  He is still having lower quadrant abdominal pain.  He is now having regular bowel movements over the last week.   He states he never had to use the fleets enema as he began having good bowel movements.  I saw him in 08/2022 where we obtained labs and a plain film xray of his abdomen that showed dilated loops of bowel where he had a possible ileus.  At that time he was just having left lower quadrant abdominal pain but no nausea or vomiting and he was having bowel movements.  His pain worsened where he went into the ER on 08/17/2022.  They repeated his labs and performed a CT scan of his abd/pelvis to evaluate for obstruction and this showed some mild bladder wall thickening but no evidence of obstruction or inflammatory process. The patient states he stopped the antibiotics and the ER gave him zofran and dicyclomine as he had one episode of vomiting on 08/17/2022.  His pain worsened the next day on 08/18/2022 but he did not go back to the ER.   When I saw him last, he was having constipation where he was placed on benefiber and miralax.  Today, he states he is not eating too well but he denies any nausea or vomiting.       Past Medical History Past Medical History:  Diagnosis Date   Anxiety and depression     Bradycardia by electrocardiogram 01/20/2018   Cervical disc disorder at C4-C5 level with myelopathy 12/05/2016   Chronic atrial fibrillation ([place]) 03/01/2018   Contracture of right knee 12/05/2016   COPD (chronic obstructive pulmonary disease) ([place]) 03/22/2012   Coronary artery disease involving native coronary artery of native heart without angina pectoris 03/22/2012   Overview:  Coronary artery bypass graft August 2013 LIMA to LAD and SVG to RCA and SVG to obtuse marginal   Dyslipidemia 03/26/2015   Esophageal stenosis    Gait disturbance 10/14/2017   GERD (gastroesophageal reflux disease) 03/22/2012   Hemorrhoids    History of degenerative disc disease    HTN (hypertension) 03/22/2012   Hypertrophy of prostate    MI, old 03/22/2012   NSVT (nonsustained ventricular tachycardia) ([place]) 04/05/2020   Osteoarthrosis    Paroxysmal atrial fibrillation ([place]) 03/26/2015   Overview:  Chads score 2, was anticoagulated but developed tarry stool optic admission was withdrawn for now scheduled to see gastroenterologist   Persistent atrial fibrillation ([place]) 01/20/2018   Pre-op evaluation 03/25/2016   S/P CABG x 3 03/25/2012   Tobacco abuse 03/22/2012   Overview:  Quit 2 years ago     Allergies Allergies  Allergen Reactions   Aciphex [Rabeprazole Sodium] Hives  Lipitor [Atorvastatin]    Omeprazole Hives   Pravachol [Pravastatin]      Medications  Current Outpatient Medications:    amoxicillin-clavulanate (AUGMENTIN) 875-125 MG tablet, Take 1 tablet by mouth 2 (two) times daily., Disp: 20 tablet, Rfl: 0   Review of Systems Review of Systems  Constitutional:  Negative for chills and fever.  Respiratory:  Negative for cough and shortness of breath.   Cardiovascular:  Negative for chest pain, palpitations and leg swelling.  Gastrointestinal:  Positive for abdominal pain. Negative for blood in stool, constipation, diarrhea, melena, nausea and vomiting.  Genitourinary:  Positive for dysuria. Negative for  flank pain, frequency, hematuria and urgency.  Musculoskeletal:  Negative for myalgias.  Neurological:  Negative for dizziness, weakness and headaches.       Objective:    Vitals BP 138/70   Pulse 61   Temp (!) 97.2 F (36.2 C)   Resp 16   SpO2 97%    Physical Examination Physical Exam Constitutional:      Appearance: Normal appearance. He is not ill-appearing.  Cardiovascular:     Rate and Rhythm: Normal rate and regular rhythm.     Pulses: Normal pulses.     Heart sounds: No murmur heard.    No friction rub. No gallop.  Pulmonary:     Effort: Pulmonary effort is normal. No respiratory distress.     Breath sounds: No wheezing, rhonchi or rales.  Abdominal:     General: Abdomen is flat. Bowel sounds are normal. There is no distension.     Palpations: Abdomen is soft.     Tenderness: There is no abdominal tenderness.  Musculoskeletal:     Right lower leg: No edema.     Left lower leg: No edema.  Skin:    General: Skin is warm and dry.     Findings: No rash.  Neurological:     General: No focal deficit present.     Mental Status: He is alert and oriented to person, place, and time.  Psychiatric:        Mood and Affect: Mood normal.        Behavior: Behavior normal.        Assessment & Plan:   Dysuria We did a UA on him today which was trace LE.  I am going to start him on empiric cipro at this time.  They noted some bladder wall thickening where I am going to refer him to urology.  Confusion The patient was confused last night but is completely back to baseline today.  We will treat his urine as below.  I will also check a CBC and CMP.  If he worsens, I have asked him to go to the ER.    No follow-ups on file.   Townsend Roger, MD

## 2022-10-17 LAB — CBC WITH DIFFERENTIAL/PLATELET
Basophils Absolute: 0.1 10*3/uL (ref 0.0–0.2)
Basos: 1 %
EOS (ABSOLUTE): 0.2 10*3/uL (ref 0.0–0.4)
Eos: 2 %
Hematocrit: 43.3 % (ref 37.5–51.0)
Hemoglobin: 14.9 g/dL (ref 13.0–17.7)
Immature Grans (Abs): 0 10*3/uL (ref 0.0–0.1)
Immature Granulocytes: 0 %
Lymphocytes Absolute: 1.6 10*3/uL (ref 0.7–3.1)
Lymphs: 19 %
MCH: 32.3 pg (ref 26.6–33.0)
MCHC: 34.4 g/dL (ref 31.5–35.7)
MCV: 94 fL (ref 79–97)
Monocytes Absolute: 0.8 10*3/uL (ref 0.1–0.9)
Monocytes: 9 %
Neutrophils Absolute: 5.6 10*3/uL (ref 1.4–7.0)
Neutrophils: 69 %
Platelets: 200 10*3/uL (ref 150–450)
RBC: 4.61 x10E6/uL (ref 4.14–5.80)
RDW: 12.2 % (ref 11.6–15.4)
WBC: 8.2 10*3/uL (ref 3.4–10.8)

## 2022-10-17 LAB — CMP14 + ANION GAP
ALT: 7 IU/L (ref 0–44)
AST: 15 IU/L (ref 0–40)
Albumin/Globulin Ratio: 1.4 (ref 1.2–2.2)
Albumin: 4.3 g/dL (ref 3.7–4.7)
Alkaline Phosphatase: 104 IU/L (ref 44–121)
Anion Gap: 15 mmol/L (ref 10.0–18.0)
BUN/Creatinine Ratio: 10 (ref 10–24)
BUN: 12 mg/dL (ref 8–27)
Bilirubin Total: 0.6 mg/dL (ref 0.0–1.2)
CO2: 22 mmol/L (ref 20–29)
Calcium: 9.6 mg/dL (ref 8.6–10.2)
Chloride: 101 mmol/L (ref 96–106)
Creatinine, Ser: 1.25 mg/dL (ref 0.76–1.27)
Globulin, Total: 3 g/dL (ref 1.5–4.5)
Glucose: 99 mg/dL (ref 70–99)
Potassium: 4.3 mmol/L (ref 3.5–5.2)
Sodium: 138 mmol/L (ref 134–144)
Total Protein: 7.3 g/dL (ref 6.0–8.5)
eGFR: 56 mL/min/{1.73_m2} — ABNORMAL LOW (ref >59)

## 2022-10-22 ENCOUNTER — Ambulatory Visit: Payer: Medicare HMO | Admitting: Internal Medicine

## 2022-10-24 ENCOUNTER — Encounter: Payer: Self-pay | Admitting: Internal Medicine

## 2022-10-24 ENCOUNTER — Ambulatory Visit: Payer: Medicare HMO | Admitting: Internal Medicine

## 2022-10-24 VITALS — BP 128/78 | HR 68 | Temp 97.3°F | Resp 16 | Ht 71.0 in

## 2022-10-24 DIAGNOSIS — K219 Gastro-esophageal reflux disease without esophagitis: Secondary | ICD-10-CM

## 2022-10-24 DIAGNOSIS — K5900 Constipation, unspecified: Secondary | ICD-10-CM

## 2022-10-24 DIAGNOSIS — I10 Essential (primary) hypertension: Secondary | ICD-10-CM | POA: Diagnosis not present

## 2022-10-24 DIAGNOSIS — I482 Chronic atrial fibrillation, unspecified: Secondary | ICD-10-CM | POA: Diagnosis not present

## 2022-10-24 NOTE — Progress Notes (Signed)
Office Visit  Subjective   Patient ID: Cameron Willis   DOB: 04-Apr-1934   Age: 89 y.o.   MRN: [id number]   Chief Complaint Chief Complaint  Patient presents with   Follow-up     History of Present Illness [name] is a 89 yo male who comes in today for followup of his A. Fib.  I saw him last year where we discussed his A. Fib.  He had not seen cardiology in over a year and had not had any recent interrogation of his PM.  I sent him to go see Dr. Curt Bears in EP and he saw him on 06/11/2022.  They checked his device and it was functioning appropriately.  He discussed Eliquis (I told the patient he needed to be on this) but the patient has decided he does not want to be on this.  He does have moderately severe atrial fibrillation of about 10 years known duration and presents today for a status visit.   Again, he was admitted to [facility] on 07/27/2020 with chest pain.  They felt he had some angina which could have been exacerbated by sick sinus syndrome where his pulse was dropping down to the 30's.  They transferred him for pacemaker placement.   He had a pacemaker placed on 07/30/2020.  Again, he was hospitalized with the flu and A. Fib in 10/2017.  AV nodal blockade was deferred due to the patient having bradycardia as well as a Type I Mobitz AV block.  They calculated his CHAD2VASC score to be 3 and the patient was started on eliquis.  They did perform an ECHO in 10/2016 and this showed a normal LVEF of 55-60% with asymmetric apical hypertropthy.  He had a repeat ECHO done in 07/29/2020 which showed a normal LVEF 60-65% with normal LV function and no regional wall motion abnormalities and normal right ventricular function.  His left atrium was mildly dilated.   His atrial fibrillation is controlled with therapy as summarized in the medication list and previous notes. Specifically denied complaints: chest pain, palpitations, TIAs, edema, exertional dyspnea, and syncope. Interval history: see note from consulting  physician. He is followed by cardiology with his last visit with electrophysiology in 11/05/2020.  They felt he had a normal interrogation.  He was on amiodarone for a history of NSVT.       The patient is a 89 year old Caucasian/White male who presents for a follow-up evaluation of hypertension. The patient has been checking his blood pressure at home.   He states his BP is running in the 110's.  The patient's current medications include: no medications. The patient denies any headache, visual changes, dizziness, lightheadness, chest pain, shortness of breath, weakness/numbness, and edema.  He reports there have been no other symptoms noted.  I did see him on 10/16/2022 about a week ago for an episode of confusion and dysuria.  We did a UA which was negative and did labs which were negative.  Since then, he did improve however he [place] [employer] and was there for the weekend and stayed up late.  After that, he states he was "hurting" and he is doing better and not having confusion.  The patient states that last week whenever he drinks cranberry juice and sometimes coffee he gets indigestion with some pain in his epigastic area.  This only occurs when he does drinks.  However, when asked about BM's, he states he is not having regular BM's.  He  does not remember his last BM.      Past Medical History Past Medical History:  Diagnosis Date   Anxiety and depression    Bradycardia by electrocardiogram 01/20/2018   Cervical disc disorder at C4-C5 level with myelopathy 12/05/2016   Chronic atrial fibrillation ([place]) 03/01/2018   Contracture of right knee 12/05/2016   COPD (chronic obstructive pulmonary disease) ([place]) 03/22/2012   Coronary artery disease involving native coronary artery of native heart without angina pectoris 03/22/2012   Overview:  Coronary artery bypass graft August 2013 LIMA to LAD and SVG to RCA and SVG to obtuse marginal   Dyslipidemia 03/26/2015   Esophageal stenosis    Gait  disturbance 10/14/2017   GERD (gastroesophageal reflux disease) 03/22/2012   Hemorrhoids    History of degenerative disc disease    HTN (hypertension) 03/22/2012   Hypertrophy of prostate    MI, old 03/22/2012   NSVT (nonsustained ventricular tachycardia) ([place]) 04/05/2020   Osteoarthrosis    Paroxysmal atrial fibrillation ([place]) 03/26/2015   Overview:  Chads score 2, was anticoagulated but developed tarry stool optic admission was withdrawn for now scheduled to see gastroenterologist   Persistent atrial fibrillation ([place]) 01/20/2018   Pre-op evaluation 03/25/2016   S/P CABG x 3 03/25/2012   Tobacco abuse 03/22/2012   Overview:  Quit 2 years ago     Allergies Allergies  Allergen Reactions   Aciphex [Rabeprazole Sodium] Hives   Lipitor [Atorvastatin]    Omeprazole Hives   Pravachol [Pravastatin]      Medications  Current Outpatient Medications:    amoxicillin-clavulanate (AUGMENTIN) 875-125 MG tablet, Take 1 tablet by mouth 2 (two) times daily., Disp: 20 tablet, Rfl: 0   Review of Systems Review of Systems  Constitutional:  Negative for chills and fever.  Eyes:  Negative for blurred vision and double vision.  Respiratory:  Negative for cough and shortness of breath.   Cardiovascular:  Negative for chest pain, palpitations and leg swelling.  Gastrointestinal:  Positive for abdominal pain, constipation and heartburn. Negative for diarrhea, nausea and vomiting.  Genitourinary:  Negative for dysuria, frequency and hematuria.  Skin:  Negative for itching and rash.  Neurological:  Negative for dizziness, weakness and headaches.       Objective:    Vitals BP 128/78   Pulse 68   Temp (!) 97.3 F (36.3 C)   Resp 16   Ht 5\' 11"  (1.803 m)   SpO2 97%   BMI 22.32 kg/m    Physical Examination Physical Exam Constitutional:      Appearance: Normal appearance. He is not ill-appearing.  Cardiovascular:     Rate and Rhythm: Normal rate and regular rhythm.     Pulses: Normal pulses.      Heart sounds: No murmur heard.    No friction rub. No gallop.  Pulmonary:     Effort: Pulmonary effort is normal. No respiratory distress.     Breath sounds: No wheezing, rhonchi or rales.  Abdominal:     General: Abdomen is flat. Bowel sounds are normal. There is no distension.     Palpations: Abdomen is soft.     Tenderness: There is no abdominal tenderness.  Musculoskeletal:     Right lower leg: No edema.     Left lower leg: No edema.  Skin:    General: Skin is warm and dry.     Findings: No rash.  Neurological:     Mental Status: He is alert.        Assessment &  Plan:   Chronic atrial fibrillation ([place]) He is not on any medications at this time.  He had his device interrogated in 06/2022.  Today, he seems to be in sinus rhythm and his HR is controlled.  He does not want to be on any medications and he understands the risk of this.  He does not want to be on Eliquis and he understands the risk of this.    Essential hypertension He is not on meds and we discussed risks of not being on meds.  His BP has been controlled.  He does not need BP meds.  GERD (gastroesophageal reflux disease) He is having reflux problems and I gave him samples of pepcid to take as needed.  Constipation He is not having regular BM's.  I asked him to start taking benefiber daily and gave him samples of mirlax to take prn if he does not have a BM.    Return in about 3 months (around 01/24/2023).   Townsend Roger, MD

## 2022-10-24 NOTE — Assessment & Plan Note (Signed)
He is not on any medications at this time.  He had his device interrogated in 06/2022.  Today, he seems to be in sinus rhythm and his HR is controlled.  He does not want to be on any medications and he understands the risk of this.  He does not want to be on Eliquis and he understands the risk of this.

## 2022-10-24 NOTE — Assessment & Plan Note (Signed)
He is not on meds and we discussed risks of not being on meds.  His BP has been controlled.  He does not need BP meds.

## 2022-10-24 NOTE — Assessment & Plan Note (Signed)
He is having reflux problems and I gave him samples of pepcid to take as needed.

## 2022-10-24 NOTE — Assessment & Plan Note (Signed)
He is not having regular BM's.  I asked him to start taking benefiber daily and gave him samples of mirlax to take prn if he does not have a BM.

## 2022-10-29 ENCOUNTER — Ambulatory Visit (INDEPENDENT_AMBULATORY_CARE_PROVIDER_SITE_OTHER): Payer: Medicare HMO

## 2022-10-29 DIAGNOSIS — I442 Atrioventricular block, complete: Secondary | ICD-10-CM | POA: Diagnosis not present

## 2022-10-29 LAB — CUP PACEART REMOTE DEVICE CHECK
Battery Remaining Longevity: 139 mo
Battery Voltage: 3.03 V
Brady Statistic RV Percent Paced: 99.17 %
Date Time Interrogation Session: 20240326212346
Implantable Lead Connection Status: 753985
Implantable Lead Implant Date: 20211227
Implantable Lead Location: 753860
Implantable Lead Model: 3830
Implantable Pulse Generator Implant Date: 20211227
Lead Channel Impedance Value: 437 Ohm
Lead Channel Impedance Value: 532 Ohm
Lead Channel Pacing Threshold Amplitude: 0.625 V
Lead Channel Pacing Threshold Pulse Width: 0.4 ms
Lead Channel Sensing Intrinsic Amplitude: 23 mV
Lead Channel Sensing Intrinsic Amplitude: 23 mV
Lead Channel Setting Pacing Amplitude: 2 V
Lead Channel Setting Pacing Pulse Width: 0.4 ms
Lead Channel Setting Sensing Sensitivity: 0.9 mV
Zone Setting Status: 755011

## 2022-11-03 DIAGNOSIS — N401 Enlarged prostate with lower urinary tract symptoms: Secondary | ICD-10-CM | POA: Diagnosis not present

## 2022-11-03 DIAGNOSIS — J449 Chronic obstructive pulmonary disease, unspecified: Secondary | ICD-10-CM | POA: Diagnosis not present

## 2022-11-03 DIAGNOSIS — R351 Nocturia: Secondary | ICD-10-CM | POA: Diagnosis not present

## 2022-11-05 ENCOUNTER — Telehealth: Payer: Self-pay

## 2022-11-05 NOTE — Patient Outreach (Signed)
  Care Coordination   11/05/2022 Name: Cameron Willis MRN: [id number] DOB: 12/14/1933   Care Coordination Outreach Attempts:  An unsuccessful telephone outreach was attempted today to offer the patient information about available care coordination services as a benefit of their health plan.   Follow Up Plan:  Additional outreach attempts will be made to offer the patient care coordination information and services.   Encounter Outcome:  No Answer   Care Coordination Interventions:  No, not indicated    Tomasa Rand, RN, BSN, [facility] [employer] [phone]

## 2022-11-06 ENCOUNTER — Telehealth: Payer: Self-pay

## 2022-11-06 NOTE — Patient Outreach (Signed)
  Care Coordination   11/06/2022 Name: Cameron Willis MRN: [id number] DOB: November 21, 1933   Care Coordination Outreach Attempts:  A second unsuccessful outreach was attempted today to offer the patient with information about available care coordination services as a benefit of their health plan.     Follow Up Plan:  Additional outreach attempts will be made to offer the patient care coordination information and services.   Encounter Outcome:  No Answer   Care Coordination Interventions:  No, not indicated    Tomasa Rand, RN, BSN, [facility] [employer] [phone]

## 2022-11-07 ENCOUNTER — Telehealth: Payer: Self-pay

## 2022-11-07 NOTE — Patient Outreach (Signed)
  Care Coordination   Initial Visit Note   11/07/2022 Name: Cameron Willis MRN: [id number] DOB: [birth year]  Cameron Willis is a 89 y.o. year old male who sees Cameron Fat, MD for primary care. I spoke with  Cameron Willis by phone today.  What matters to the patients health and wellness today?  Placed call to patient to review and offer [facility] care coordination program.  Patient reports that he is doing well and denies any needs at this time.     SDOH assessments and interventions completed:  No     Care Coordination Interventions:  No, not indicated   Follow up plan: No further intervention required.   Encounter Outcome:  Pt. Refused   Cameron Pavy, RN, BSN, [facility] [employer] [phone]

## 2022-12-08 NOTE — Progress Notes (Signed)
Remote pacemaker transmission.   

## 2023-01-26 ENCOUNTER — Ambulatory Visit: Payer: Medicare HMO | Admitting: Internal Medicine

## 2023-01-26 ENCOUNTER — Encounter: Payer: Self-pay | Admitting: Internal Medicine

## 2023-01-26 VITALS — BP 120/62 | HR 61 | Temp 98.0°F | Resp 16 | Ht 71.0 in

## 2023-01-26 DIAGNOSIS — I25810 Atherosclerosis of coronary artery bypass graft(s) without angina pectoris: Secondary | ICD-10-CM | POA: Diagnosis not present

## 2023-01-26 DIAGNOSIS — I10 Essential (primary) hypertension: Secondary | ICD-10-CM | POA: Diagnosis not present

## 2023-01-26 DIAGNOSIS — G729 Myopathy, unspecified: Secondary | ICD-10-CM | POA: Diagnosis not present

## 2023-01-26 NOTE — Assessment & Plan Note (Signed)
His CAD is stable. He does not want to be on any meds including an ASA.  He has been warned about the long term consequences but he states he feels better and does better off his meds.  He has multiple comorbidities and I agree with him to stay off his meds as per his wish.

## 2023-01-26 NOTE — Assessment & Plan Note (Signed)
His BP is controlled.  Again, he stopped all his meds.  We will continue to monitor.

## 2023-01-26 NOTE — Progress Notes (Signed)
Office Visit  Subjective   Patient ID: Cameron Willis   DOB: Jul 09, 1934   Age: 89 y.o.   MRN: [id number]   Chief Complaint Chief Complaint  Patient presents with   Follow-up     History of Present Illness The patient is a 89 year old Caucasian/White male who presents for a follow-up evaluation of hypertension. The patient has been checking his blood pressure at home.   He states his BP is running in the 110-130's.  The patient's current medications include: no medications. The patient denies any headache, visual changes, dizziness, lightheadness, chest pain, shortness of breath, weakness/numbness, and edema.  He reports there have been no other symptoms noted.  Cameron Willis has a history of a widespread myopathic disorder with continued weakness where he uses a motorized chair.  Over the interim, he states he has become a little more weak in his lower legs.  I asked him about transfer from bed or chair to his motorized chair and he states he can do this most of the time but has to have help occasionally.  He states he is weak everywhere and this initially occurred around 2017.  He was supposed to followup with neurology where they wanted him to be send to a neuromuscular specialist but he wanted to think about it and get back to them.  He still has never did this.  He uses a mobile wheelchair to get around.  Again, he has had labs, muscle biopsy and EMG testing in the past.  His EMG testing showed concern for a widespread myopathic disorder.    This patient also has moderately severe CAD of about 10 years known duration and presents today for a status visit. See PMH for summary of cardiac history and status of revascularization. Dr. Janey Genta at [facility] did a CABG in 03/2012.  He has not gone back to see cardiology since 2022.  His CAD is controlled with therapy as summarized in the medication list and previous notes. The patient has the following comorbid condition(s): atrial fibrillation. He has no baseline  symptoms of CAD. He has the following modifiable risk factor(s): HTN, hyperlipidemia, and sedentary lifestyle. Specifically denied complaint(s): chest pain, palpitations, edema, exertional dyspnea, and syncope. Interval history: see note from consulting physician. Interval studies: none. On review of his notes, his LV gram showed normal systolic function.      Past Medical History Past Medical History:  Diagnosis Date   Anxiety and depression    Bradycardia by electrocardiogram 01/20/2018   Cervical disc disorder at C4-C5 level with myelopathy 12/05/2016   Chronic atrial fibrillation ([facility]) 03/01/2018   Contracture of right knee 12/05/2016   COPD (chronic obstructive pulmonary disease) ([facility]) 03/22/2012   Coronary artery disease involving native coronary artery of native heart without angina pectoris 03/22/2012   Overview:  Coronary artery bypass graft August 2013 LIMA to LAD and SVG to RCA and SVG to obtuse marginal   Dyslipidemia 03/26/2015   Esophageal stenosis    Gait disturbance 10/14/2017   GERD (gastroesophageal reflux disease) 03/22/2012   Hemorrhoids    History of degenerative disc disease    HTN (hypertension) 03/22/2012   Hypertrophy of prostate    MI, old 03/22/2012   NSVT (nonsustained ventricular tachycardia) ([facility]) 04/05/2020   Osteoarthrosis    Paroxysmal atrial fibrillation ([facility]) 03/26/2015   Overview:  Chads score 2, was anticoagulated but developed tarry stool optic admission was withdrawn for now scheduled to see gastroenterologist   Persistent atrial fibrillation ([facility]) 01/20/2018  Pre-op evaluation 03/25/2016   S/P CABG x 3 03/25/2012   Tobacco abuse 03/22/2012   Overview:  Quit 2 years ago     Allergies Allergies  Allergen Reactions   Aciphex [Rabeprazole Sodium] Hives   Lipitor [Atorvastatin]    Omeprazole Hives   Pravachol [Pravastatin]      Medications No current outpatient medications on file.   Review of Systems Review of Systems  Constitutional:  Negative for  chills and fever.  Eyes:  Negative for blurred vision and double vision.  Respiratory:  Negative for cough, sputum production, shortness of breath and wheezing.   Cardiovascular:  Negative for chest pain, palpitations and leg swelling.  Gastrointestinal:  Negative for abdominal pain, constipation, diarrhea, nausea and vomiting.  Neurological:  Negative for dizziness, weakness and headaches.       Objective:    Vitals BP 120/62   Pulse 61   Temp 98 F (36.7 C)   Resp 16   Ht 5\' 11"  (1.803 m)   SpO2 96%   BMI 22.32 kg/m    Physical Examination Physical Exam Constitutional:      Appearance: Normal appearance. He is not ill-appearing.  Cardiovascular:     Rate and Rhythm: Normal rate and regular rhythm.     Pulses: Normal pulses.     Heart sounds: No murmur heard.    No friction rub. No gallop.  Pulmonary:     Effort: Pulmonary effort is normal. No respiratory distress.     Breath sounds: No wheezing, rhonchi or rales.  Abdominal:     General: Abdomen is flat. Bowel sounds are normal. There is no distension.     Palpations: Abdomen is soft.     Tenderness: There is no abdominal tenderness.  Musculoskeletal:     Right lower leg: No edema.     Left lower leg: No edema.  Skin:    General: Skin is warm and dry.     Findings: No rash.  Neurological:     Mental Status: He is alert.        Assessment & Plan:   HTN (hypertension) His BP is controlled.  Again, he stopped all his meds.  We will continue to monitor.  Myopathic disease or syndrome He has a history of widespread myopathy disease which started with weakness around 2017.  He never did want a workup for this. I think this is stable at this point.  Coronary artery disease involving coronary bypass graft of native heart without angina pectoris His CAD is stable. He does not want to be on any meds including an ASA.  He has been warned about the long term consequences but he states he feels better and does better  off his meds.  He has multiple comorbidities and I agree with him to stay off his meds as per his wish.    Return in about 3 months (around 04/28/2023) for annual.   Crist Fat, MD

## 2023-01-26 NOTE — Assessment & Plan Note (Signed)
He has a history of widespread myopathy disease which started with weakness around 2017.  He never did want a workup for this. I think this is stable at this point.

## 2023-01-28 ENCOUNTER — Ambulatory Visit (INDEPENDENT_AMBULATORY_CARE_PROVIDER_SITE_OTHER): Payer: Medicare HMO

## 2023-01-28 DIAGNOSIS — I442 Atrioventricular block, complete: Secondary | ICD-10-CM | POA: Diagnosis not present

## 2023-01-29 LAB — CUP PACEART REMOTE DEVICE CHECK
Battery Remaining Longevity: 136 mo
Battery Voltage: 3.03 V
Brady Statistic RV Percent Paced: 99.68 %
Date Time Interrogation Session: 20240627141544
Implantable Lead Connection Status: 753985
Implantable Lead Implant Date: 20211227
Implantable Lead Location: 753860
Implantable Lead Model: 3830
Implantable Pulse Generator Implant Date: 20211227
Lead Channel Impedance Value: 437 Ohm
Lead Channel Impedance Value: 532 Ohm
Lead Channel Pacing Threshold Amplitude: 0.625 V
Lead Channel Pacing Threshold Pulse Width: 0.4 ms
Lead Channel Sensing Intrinsic Amplitude: 31.625 mV
Lead Channel Sensing Intrinsic Amplitude: 31.625 mV
Lead Channel Setting Pacing Amplitude: 2 V
Lead Channel Setting Pacing Pulse Width: 0.4 ms
Lead Channel Setting Sensing Sensitivity: 0.9 mV
Zone Setting Status: 755011

## 2023-02-09 ENCOUNTER — Encounter: Payer: Self-pay | Admitting: Internal Medicine

## 2023-02-09 ENCOUNTER — Ambulatory Visit: Payer: Medicare HMO | Admitting: Internal Medicine

## 2023-02-09 VITALS — BP 128/70 | HR 62 | Temp 97.2°F | Resp 18 | Ht 71.0 in | Wt 160.0 lb

## 2023-02-09 DIAGNOSIS — R0781 Pleurodynia: Secondary | ICD-10-CM

## 2023-02-09 DIAGNOSIS — I700 Atherosclerosis of aorta: Secondary | ICD-10-CM | POA: Diagnosis not present

## 2023-02-09 NOTE — Assessment & Plan Note (Signed)
He states he will use tylenol as needed.  We will obtain a CXR to see if he has any broken ribs.

## 2023-02-09 NOTE — Progress Notes (Signed)
Office Visit  Subjective   Patient ID: Cameron Willis   DOB: [birth year]   Age: 89 y.o.   MRN: [id number]   Chief Complaint Chief Complaint  Patient presents with   office visit     Right rib pain  started 3 week ago      History of Present Illness [name] is a 89 yo male who comes in today with right sided rib pain.  He states that about 3 weeks ago he was being helped out of his motorized wheelchair and they lift him by his thorax.  He is having intense sharp pain that is intermittent and usually occurs when he is lying down.  He has taken tylenol as needed which does help.     Past Medical History Past Medical History:  Diagnosis Date   Anxiety and depression    Bradycardia by electrocardiogram 01/20/2018   Cervical disc disorder at C4-C5 level with myelopathy 12/05/2016   Chronic atrial fibrillation (HCC) 03/01/2018   Contracture of right knee 12/05/2016   COPD (chronic obstructive pulmonary disease) (HCC) 03/22/2012   Coronary artery disease involving native coronary artery of native heart without angina pectoris 03/22/2012   Overview:  Coronary artery bypass graft August 2013 LIMA to LAD and SVG to RCA and SVG to obtuse marginal   Dyslipidemia 03/26/2015   Esophageal stenosis    Gait disturbance 10/14/2017   GERD (gastroesophageal reflux disease) 03/22/2012   Hemorrhoids    History of degenerative disc disease    HTN (hypertension) 03/22/2012   Hypertrophy of prostate    MI, old 03/22/2012   NSVT (nonsustained ventricular tachycardia) (HCC) 04/05/2020   Osteoarthrosis    Paroxysmal atrial fibrillation (HCC) 03/26/2015   Overview:  Chads score 2, was anticoagulated but developed tarry stool optic admission was withdrawn for now scheduled to see gastroenterologist   Persistent atrial fibrillation (HCC) 01/20/2018   Pre-op evaluation 03/25/2016   S/P CABG x 3 03/25/2012   Tobacco abuse 03/22/2012   Overview:  Quit 2 years ago     Allergies Allergies  Allergen Reactions   Aciphex  [Rabeprazole Sodium] Hives   Lipitor [Atorvastatin]    Omeprazole Hives   Pravachol [Pravastatin]      Medications No current outpatient medications on file.   Review of Systems Review of Systems  Constitutional:  Negative for chills and fever.  Respiratory:  Negative for cough and shortness of breath.   Cardiovascular:  Negative for chest pain, palpitations and leg swelling.  Gastrointestinal:  Negative for abdominal pain, constipation, diarrhea, nausea and vomiting.  Musculoskeletal:  Negative for back pain.  Neurological:  Negative for dizziness and headaches.       Objective:    Vitals BP 128/70 (BP Location: Left Arm, Patient Position: Sitting, Cuff Size: Normal)   Pulse 62   Temp (!) 97.2 F (36.2 C)   Resp 18   Ht 5\' 11"  (1.803 m)   Wt 160 lb (72.6 kg)   SpO2 96%   BMI 22.32 kg/m    Physical Examination Physical Exam Constitutional:      Appearance: Normal appearance. He is not ill-appearing.  Cardiovascular:     Rate and Rhythm: Normal rate and regular rhythm.     Pulses: Normal pulses.     Heart sounds: No murmur heard.    No friction rub. No gallop.  Pulmonary:     Effort: Pulmonary effort is normal. No respiratory distress.     Breath sounds: No wheezing, rhonchi or rales.  Abdominal:     General: Abdomen is flat. Bowel sounds are normal. There is no distension.     Palpations: Abdomen is soft.     Tenderness: There is no abdominal tenderness.  Musculoskeletal:     Right lower leg: No edema.     Left lower leg: No edema.     Comments: He has pain on palpation of his lower right ribs  Skin:    General: Skin is warm and dry.     Findings: No rash.  Neurological:     Mental Status: He is alert.        Assessment & Plan:   Rib pain He states he will use tylenol as needed.  We will obtain a CXR to see if he has any broken ribs.    No follow-ups on file.   Crist Fat, MD

## 2023-02-18 NOTE — Progress Notes (Signed)
 Remote pacemaker transmission.   

## 2023-02-25 ENCOUNTER — Encounter: Payer: Self-pay | Admitting: Internal Medicine

## 2023-04-29 ENCOUNTER — Ambulatory Visit: Payer: Medicare HMO

## 2023-04-29 DIAGNOSIS — I442 Atrioventricular block, complete: Secondary | ICD-10-CM

## 2023-04-29 LAB — CUP PACEART REMOTE DEVICE CHECK
Battery Remaining Longevity: 133 mo
Battery Voltage: 3.03 V
Brady Statistic RV Percent Paced: 99.62 %
Date Time Interrogation Session: 20240924210537
Implantable Lead Connection Status: 753985
Implantable Lead Implant Date: 20211227
Implantable Lead Location: 753860
Implantable Lead Model: 3830
Implantable Pulse Generator Implant Date: 20211227
Lead Channel Impedance Value: 418 Ohm
Lead Channel Impedance Value: 532 Ohm
Lead Channel Pacing Threshold Amplitude: 0.625 V
Lead Channel Pacing Threshold Pulse Width: 0.4 ms
Lead Channel Sensing Intrinsic Amplitude: 14.375 mV
Lead Channel Sensing Intrinsic Amplitude: 14.375 mV
Lead Channel Setting Pacing Amplitude: 2 V
Lead Channel Setting Pacing Pulse Width: 0.4 ms
Lead Channel Setting Sensing Sensitivity: 0.9 mV
Zone Setting Status: 755011

## 2023-05-05 ENCOUNTER — Ambulatory Visit: Payer: Medicare HMO | Admitting: Internal Medicine

## 2023-05-07 ENCOUNTER — Encounter: Payer: Self-pay | Admitting: Internal Medicine

## 2023-05-07 ENCOUNTER — Ambulatory Visit: Payer: Medicare HMO | Admitting: Internal Medicine

## 2023-05-07 VITALS — BP 120/72 | HR 71 | Temp 98.2°F | Resp 17 | Wt 122.0 lb

## 2023-05-07 DIAGNOSIS — I700 Atherosclerosis of aorta: Secondary | ICD-10-CM | POA: Diagnosis not present

## 2023-05-07 DIAGNOSIS — K828 Other specified diseases of gallbladder: Secondary | ICD-10-CM | POA: Diagnosis not present

## 2023-05-07 DIAGNOSIS — K8020 Calculus of gallbladder without cholecystitis without obstruction: Secondary | ICD-10-CM | POA: Diagnosis not present

## 2023-05-07 DIAGNOSIS — R1011 Right upper quadrant pain: Secondary | ICD-10-CM | POA: Insufficient documentation

## 2023-05-07 DIAGNOSIS — Z950 Presence of cardiac pacemaker: Secondary | ICD-10-CM | POA: Diagnosis not present

## 2023-05-07 DIAGNOSIS — R079 Chest pain, unspecified: Secondary | ICD-10-CM | POA: Diagnosis not present

## 2023-05-07 LAB — POCT URINALYSIS DIPSTICK
Bilirubin, UA: NEGATIVE
Glucose, UA: NEGATIVE
Ketones, UA: NEGATIVE
Nitrite, UA: NEGATIVE
Protein, UA: POSITIVE — AB
Spec Grav, UA: >=1.03 — AB (ref 1.010–1.025)
Urobilinogen, UA: 1 U/dL
pH, UA: 5.5 (ref 5.0–8.0)

## 2023-05-07 NOTE — Assessment & Plan Note (Signed)
He has new RUQ abdominal pain on exam.  I am going to obtain a CXR, CBC, CMP and a UA and send him for a RUQ [place] stat.  He may also need a CT scan of his abdomen/pelvis.  Further care will depend on labs and [place].

## 2023-05-07 NOTE — Progress Notes (Unsigned)
Office Visit  Subjective   Patient ID: Cameron Willis   DOB: July 15, 1934   Age: 89 y.o.   MRN: [id number]   Chief Complaint Chief Complaint  Patient presents with   Acute Visit    Right side pain x 5 weeks."Larey Seat out of bed"     History of Present Illness [name] is a 89 yo male who returns today for an acute visit for continued right sided rib pain.  I saw him on 02/09/2023 with the same pain and we obtained a CXR on 02/09/2023 and this showed no evidence of acute problems.  Again, he stated previously that he was being helped out of his motorized wheelchair and they lift him by his thorax.  He is having intense sharp pain that is intermittent and usually occurs when he is lying down.  He has taken tylenol as needed which does help.  He is now having RUQ abdominal pain that began about 1 month ago.  He states this especially occurs when he is lifted up.  He has been eating ok.  He weighed 160 lb in 7/8 (but this was guessed and the patient's son said they did not weigh him and just told the nurse his weight).  There has been no fevers, chills, n/v, but he is having some fecal incontinence in the last week or so.     Past Medical History Past Medical History:  Diagnosis Date   Anxiety and depression    Bradycardia by electrocardiogram 01/20/2018   Cervical disc disorder at C4-C5 level with myelopathy 12/05/2016   Chronic atrial fibrillation (HCC) 03/01/2018   Contracture of right knee 12/05/2016   COPD (chronic obstructive pulmonary disease) (HCC) 03/22/2012   Coronary artery disease involving native coronary artery of native heart without angina pectoris 03/22/2012   Overview:  Coronary artery bypass graft August 2013 LIMA to LAD and SVG to RCA and SVG to obtuse marginal   Dyslipidemia 03/26/2015   Esophageal stenosis    Gait disturbance 10/14/2017   GERD (gastroesophageal reflux disease) 03/22/2012   Hemorrhoids    History of degenerative disc disease    HTN (hypertension) 03/22/2012    Hypertrophy of prostate    MI, old 03/22/2012   NSVT (nonsustained ventricular tachycardia) (HCC) 04/05/2020   Osteoarthrosis    Paroxysmal atrial fibrillation (HCC) 03/26/2015   Overview:  Chads score 2, was anticoagulated but developed tarry stool optic admission was withdrawn for now scheduled to see gastroenterologist   Persistent atrial fibrillation (HCC) 01/20/2018   Pre-op evaluation 03/25/2016   S/P CABG x 3 03/25/2012   Tobacco abuse 03/22/2012   Overview:  Quit 2 years ago     Allergies Allergies  Allergen Reactions   Aciphex [Rabeprazole Sodium] Hives   Lipitor [Atorvastatin]    Omeprazole Hives   Pravachol [Pravastatin]      Medications No current outpatient medications on file.   Review of Systems Review of Systems  Constitutional:  Negative for chills and fever.  Respiratory:  Negative for cough and shortness of breath.   Cardiovascular:  Negative for chest pain, palpitations and leg swelling.  Gastrointestinal:  Positive for abdominal pain and diarrhea. Negative for blood in stool, constipation, melena, nausea and vomiting.  Genitourinary:  Negative for frequency and hematuria.  Musculoskeletal:  Negative for myalgias.  Neurological:  Negative for dizziness, weakness and headaches.       Objective:    Vitals BP 120/72   Pulse 71   Temp 98.2 F (36.8 C)  Resp 17   Wt 122 lb (55.3 kg)   SpO2 98%   BMI 17.02 kg/m    Physical Examination Physical Exam Constitutional:      Appearance: Normal appearance. He is not ill-appearing.  Cardiovascular:     Rate and Rhythm: Normal rate and regular rhythm.     Pulses: Normal pulses.     Heart sounds: No murmur heard.    No friction rub. No gallop.  Pulmonary:     Effort: Pulmonary effort is normal. No respiratory distress.     Breath sounds: No wheezing, rhonchi or rales.  Abdominal:     General: Abdomen is flat. Bowel sounds are normal. There is no distension.     Palpations: Abdomen is soft.     Tenderness:  There is abdominal tenderness.     Comments: He has RUQ abdominal pain on palptation.  Musculoskeletal:     Right lower leg: No edema.     Left lower leg: No edema.  Skin:    General: Skin is warm and dry.     Findings: No rash.  Neurological:     Mental Status: He is alert.        Assessment & Plan:   Chest pain He has pain reproducible over the costrochondral junction of his right chest.  I think he has costrochondritis but I am concerned about this new abdominal pain.  We need to work this up first.    Right upper quadrant abdominal pain He has new RUQ abdominal pain on exam.  I am going to obtain a CXR, CBC, CMP and a UA and send him for a RUQ [place] stat.  He may also need a CT scan of his abdomen/pelvis.  Further care will depend on labs and [place].    No follow-ups on file.   Crist Fat, MD

## 2023-05-07 NOTE — Assessment & Plan Note (Signed)
He has pain reproducible over the costrochondral junction of his right chest.  I think he has costrochondritis but I am concerned about this new abdominal pain.  We need to work this up first.

## 2023-05-08 DIAGNOSIS — K8689 Other specified diseases of pancreas: Secondary | ICD-10-CM | POA: Diagnosis not present

## 2023-05-08 DIAGNOSIS — I700 Atherosclerosis of aorta: Secondary | ICD-10-CM | POA: Diagnosis not present

## 2023-05-08 DIAGNOSIS — I2510 Atherosclerotic heart disease of native coronary artery without angina pectoris: Secondary | ICD-10-CM | POA: Diagnosis not present

## 2023-05-08 DIAGNOSIS — R079 Chest pain, unspecified: Secondary | ICD-10-CM | POA: Diagnosis not present

## 2023-05-08 DIAGNOSIS — N200 Calculus of kidney: Secondary | ICD-10-CM | POA: Diagnosis not present

## 2023-05-08 DIAGNOSIS — K8020 Calculus of gallbladder without cholecystitis without obstruction: Secondary | ICD-10-CM | POA: Diagnosis not present

## 2023-05-09 LAB — URINE CULTURE

## 2023-05-12 ENCOUNTER — Encounter: Payer: Self-pay | Admitting: Internal Medicine

## 2023-05-15 NOTE — Progress Notes (Signed)
Remote pacemaker transmission.   

## 2023-05-19 DIAGNOSIS — J449 Chronic obstructive pulmonary disease, unspecified: Secondary | ICD-10-CM | POA: Diagnosis not present

## 2023-05-19 DIAGNOSIS — R110 Nausea: Secondary | ICD-10-CM | POA: Diagnosis not present

## 2023-05-19 DIAGNOSIS — K219 Gastro-esophageal reflux disease without esophagitis: Secondary | ICD-10-CM | POA: Diagnosis not present

## 2023-05-19 DIAGNOSIS — I10 Essential (primary) hypertension: Secondary | ICD-10-CM | POA: Diagnosis not present

## 2023-05-19 DIAGNOSIS — I480 Paroxysmal atrial fibrillation: Secondary | ICD-10-CM | POA: Diagnosis not present

## 2023-05-19 DIAGNOSIS — R1011 Right upper quadrant pain: Secondary | ICD-10-CM | POA: Diagnosis not present

## 2023-05-19 DIAGNOSIS — K8010 Calculus of gallbladder with chronic cholecystitis without obstruction: Secondary | ICD-10-CM | POA: Diagnosis not present

## 2023-05-19 DIAGNOSIS — I6789 Other cerebrovascular disease: Secondary | ICD-10-CM | POA: Diagnosis not present

## 2023-05-28 ENCOUNTER — Encounter: Payer: Medicare HMO | Admitting: Internal Medicine

## 2023-05-28 DIAGNOSIS — Z45018 Encounter for adjustment and management of other part of cardiac pacemaker: Secondary | ICD-10-CM | POA: Diagnosis not present

## 2023-05-28 DIAGNOSIS — I4891 Unspecified atrial fibrillation: Secondary | ICD-10-CM | POA: Diagnosis not present

## 2023-06-01 DIAGNOSIS — I10 Essential (primary) hypertension: Secondary | ICD-10-CM | POA: Diagnosis not present

## 2023-06-01 DIAGNOSIS — K8010 Calculus of gallbladder with chronic cholecystitis without obstruction: Secondary | ICD-10-CM | POA: Diagnosis not present

## 2023-06-01 DIAGNOSIS — K219 Gastro-esophageal reflux disease without esophagitis: Secondary | ICD-10-CM | POA: Diagnosis not present

## 2023-06-01 DIAGNOSIS — Z87891 Personal history of nicotine dependence: Secondary | ICD-10-CM | POA: Diagnosis not present

## 2023-06-01 DIAGNOSIS — I480 Paroxysmal atrial fibrillation: Secondary | ICD-10-CM | POA: Diagnosis not present

## 2023-06-01 DIAGNOSIS — Z8673 Personal history of transient ischemic attack (TIA), and cerebral infarction without residual deficits: Secondary | ICD-10-CM | POA: Diagnosis not present

## 2023-07-23 DIAGNOSIS — N3091 Cystitis, unspecified with hematuria: Secondary | ICD-10-CM | POA: Diagnosis not present

## 2023-07-23 DIAGNOSIS — N3000 Acute cystitis without hematuria: Secondary | ICD-10-CM | POA: Diagnosis not present

## 2023-07-23 DIAGNOSIS — R300 Dysuria: Secondary | ICD-10-CM | POA: Diagnosis not present

## 2023-07-30 ENCOUNTER — Ambulatory Visit (INDEPENDENT_AMBULATORY_CARE_PROVIDER_SITE_OTHER): Payer: Medicare HMO

## 2023-07-30 DIAGNOSIS — I442 Atrioventricular block, complete: Secondary | ICD-10-CM

## 2023-07-30 LAB — CUP PACEART REMOTE DEVICE CHECK
Battery Remaining Longevity: 130 mo
Battery Voltage: 3.02 V
Brady Statistic RV Percent Paced: 98.86 %
Date Time Interrogation Session: 20241225214828
Implantable Lead Connection Status: 753985
Implantable Lead Implant Date: 20211227
Implantable Lead Location: 753860
Implantable Lead Model: 3830
Implantable Pulse Generator Implant Date: 20211227
Lead Channel Impedance Value: 437 Ohm
Lead Channel Impedance Value: 532 Ohm
Lead Channel Pacing Threshold Amplitude: 0.625 V
Lead Channel Pacing Threshold Pulse Width: 0.4 ms
Lead Channel Sensing Intrinsic Amplitude: 20.375 mV
Lead Channel Sensing Intrinsic Amplitude: 20.375 mV
Lead Channel Setting Pacing Amplitude: 2 V
Lead Channel Setting Pacing Pulse Width: 0.4 ms
Lead Channel Setting Sensing Sensitivity: 0.9 mV
Zone Setting Status: 755011

## 2023-08-10 ENCOUNTER — Encounter: Payer: Self-pay | Admitting: Internal Medicine

## 2023-08-10 ENCOUNTER — Ambulatory Visit: Payer: Medicare HMO | Admitting: Internal Medicine

## 2023-08-10 VITALS — BP 128/72 | HR 67 | Temp 97.6°F | Resp 16 | Ht 71.0 in | Wt 145.0 lb

## 2023-08-10 DIAGNOSIS — N390 Urinary tract infection, site not specified: Secondary | ICD-10-CM

## 2023-08-10 DIAGNOSIS — J449 Chronic obstructive pulmonary disease, unspecified: Secondary | ICD-10-CM | POA: Diagnosis not present

## 2023-08-10 MED ORDER — CIPROFLOXACIN HCL 500 MG PO TABS: 500.0000 mg | ORAL_TABLET | Freq: Two times a day (BID) | ORAL | 0 refills | Status: AC

## 2023-08-10 NOTE — Assessment & Plan Note (Signed)
 His UA was positive for a UTI.  We will empirically start him on cipro for 7 days and send out his urine for culture.

## 2023-08-10 NOTE — Progress Notes (Signed)
 Office Visit  Subjective   Patient ID: HASHIM EICHHORST   DOB: 05-Apr-1934   Age: 89 y.o.   MRN: [id number]   Chief Complaint Chief Complaint  Patient presents with   Urinary Tract Infection    Follow up on uti. Has had x 3 weeks.  Went to Urgent care before Christmas, he has finished to courses of antibiotics.      History of Present Illness Mr. Rainville is a 89 yo male who comes in today for complaints of urinary symptoms.  The patient states he began having symptoms about 2 weeks ago where he presented to [facility] urgent care.  He was placed on empiric macrobid for a UTI but was called days later where they switched him to augmentin  which he completed last week.  Today, he states he is having symptoms of dysuria.  He denies any fevers, chills, nausea, vomiting, abdominal pain, hematuria, or urinary frequency.      Past Medical History Past Medical History:  Diagnosis Date   Anxiety and depression    Bradycardia by electrocardiogram 01/20/2018   Cervical disc disorder at C4-C5 level with myelopathy 12/05/2016   Chronic atrial fibrillation (HCC) 03/01/2018   Contracture of right knee 12/05/2016   COPD (chronic obstructive pulmonary disease) (HCC) 03/22/2012   Coronary artery disease involving native coronary artery of native heart without angina pectoris 03/22/2012   Overview:  Coronary artery bypass graft August 2013 LIMA to LAD and SVG to RCA and SVG to obtuse marginal   Dyslipidemia 03/26/2015   Esophageal stenosis    Gait disturbance 10/14/2017   GERD (gastroesophageal reflux disease) 03/22/2012   Hemorrhoids    History of degenerative disc disease    HTN (hypertension) 03/22/2012   Hypertrophy of prostate    MI, old 03/22/2012   NSVT (nonsustained ventricular tachycardia) (HCC) 04/05/2020   Osteoarthrosis    Paroxysmal atrial fibrillation (HCC) 03/26/2015   Overview:  Chads score 2, was anticoagulated but developed tarry stool optic admission was withdrawn for now scheduled to see  gastroenterologist   Persistent atrial fibrillation (HCC) 01/20/2018   Pre-op evaluation 03/25/2016   S/P CABG x 3 03/25/2012   Tobacco abuse 03/22/2012   Overview:  Quit 2 years ago     Allergies Allergies  Allergen Reactions   Aciphex [Rabeprazole Sodium] Hives   Lipitor [Atorvastatin]    Omeprazole Hives   Pravachol  [Pravastatin ]      Medications  Current Outpatient Medications:    amoxicillin -clavulanate (AUGMENTIN ) 500-125 MG tablet, Take 1 tablet by mouth 2 (two) times daily., Disp: , Rfl:    Review of Systems Review of Systems  Constitutional:  Negative for chills and fever.  Respiratory:  Negative for shortness of breath.   Cardiovascular:  Negative for palpitations.  Gastrointestinal:  Negative for abdominal pain, constipation, diarrhea, nausea and vomiting.  Neurological:  Negative for weakness.       Objective:    Vitals BP 128/72 (BP Location: Left Arm, Patient Position: Sitting)   Pulse 67   Temp 97.6 F (36.4 C) (Temporal)   Resp 16   Ht 5' 11 (1.803 m)   Wt 145 lb (65.8 kg)   SpO2 96%   BMI 20.22 kg/m    Physical Examination Physical Exam Constitutional:      Appearance: Normal appearance. He is not ill-appearing.  Cardiovascular:     Rate and Rhythm: Normal rate and regular rhythm.     Pulses: Normal pulses.     Heart sounds: No murmur heard.  No friction rub. No gallop.  Pulmonary:     Effort: Pulmonary effort is normal. No respiratory distress.     Breath sounds: No wheezing, rhonchi or rales.  Abdominal:     General: Abdomen is flat. Bowel sounds are normal. There is no distension.     Palpations: Abdomen is soft.     Tenderness: There is no abdominal tenderness.  Musculoskeletal:     Right lower leg: No edema.     Left lower leg: No edema.  Skin:    General: Skin is warm and dry.     Findings: No rash.  Neurological:     Mental Status: He is alert.        Assessment & Plan:   Urinary tract infection without hematuria His  UA was positive for a UTI.  We will empirically start him on cipro  for 7 days and send out his urine for culture.    No follow-ups on file.   Selinda Fleeta Finger, MD

## 2023-08-13 LAB — URINE CULTURE

## 2023-09-01 ENCOUNTER — Ambulatory Visit: Payer: Medicare HMO | Admitting: Cardiology

## 2023-09-08 ENCOUNTER — Ambulatory Visit: Payer: Medicare HMO | Admitting: Internal Medicine

## 2023-09-08 ENCOUNTER — Encounter: Payer: Self-pay | Admitting: Internal Medicine

## 2023-09-08 VITALS — BP 118/68 | HR 90 | Temp 98.6°F | Resp 17 | Ht 71.0 in | Wt 148.8 lb

## 2023-09-08 DIAGNOSIS — R319 Hematuria, unspecified: Secondary | ICD-10-CM | POA: Diagnosis not present

## 2023-09-08 DIAGNOSIS — N390 Urinary tract infection, site not specified: Secondary | ICD-10-CM | POA: Diagnosis not present

## 2023-09-08 DIAGNOSIS — M791 Myalgia, unspecified site: Secondary | ICD-10-CM | POA: Diagnosis not present

## 2023-09-08 LAB — POCT URINALYSIS DIPSTICK
Bilirubin, UA: NEGATIVE
Glucose, UA: NEGATIVE
Ketones, UA: NEGATIVE
Nitrite, UA: POSITIVE
Protein, UA: NEGATIVE
Spec Grav, UA: 1.02 (ref 1.010–1.025)
Urobilinogen, UA: 0.2 U/dL
pH, UA: 7 (ref 5.0–8.0)

## 2023-09-08 MED ORDER — AMOXICILLIN-POT CLAVULANATE 875-125 MG PO TABS: 1.0000 | ORAL_TABLET | Freq: Two times a day (BID) | ORAL | 0 refills | Status: AC

## 2023-09-08 NOTE — Assessment & Plan Note (Signed)
He has had myalgias where we tested him for COVID-19 and flu testing which were negative.

## 2023-09-08 NOTE — Addendum Note (Signed)
Addended by: Darci Needle on: 09/08/2023 02:54 PM   Modules accepted: Orders

## 2023-09-08 NOTE — Assessment & Plan Note (Signed)
His UA was postiive for a UTI.  He had proteus miralabis on his last urine culture.  I am going to put him on augmentin 875mg  po BID x 10 days.  I am also going to refer him to urology for recurrent UTI's.

## 2023-09-08 NOTE — Progress Notes (Signed)
 Office Visit  Subjective   Patient ID: Cameron Willis   DOB: 07/23/1934   Age: 89 y.o.   MRN: [id number]   Chief Complaint Chief Complaint  Patient presents with   Acute Visit     History of Present Illness [name] is a 89 yo male who comes in today for continued complaints of urinary symptoms. I did see him a month ago where he was treated for a UTI which his culture grew out proteus miralibis.  I placed him on cipro  for 7 days and he did get back.  However, his symptoms came back 2 weeks later where he is now having some abdominal pain, dysuria, back pain, and one day with hematuria.  He also claims he is having some myaglias which began a few weeks ago.  He also saw urgent care in mid Dec 2024 and was placed on empiric macrobid for a UTI but was called days later where they switched him to augmentin .  Today, he denies any fevers, chills, nausea, vomiting, urinary frequency or other problems.      Past Medical History Past Medical History:  Diagnosis Date   Anxiety and depression    Bradycardia by electrocardiogram 01/20/2018   Cervical disc disorder at C4-C5 level with myelopathy 12/05/2016   Chronic atrial fibrillation (HCC) 03/01/2018   Contracture of right knee 12/05/2016   COPD (chronic obstructive pulmonary disease) (HCC) 03/22/2012   Coronary artery disease involving native coronary artery of native heart without angina pectoris 03/22/2012   Overview:  Coronary artery bypass graft August 2013 LIMA to LAD and SVG to RCA and SVG to obtuse marginal   Dyslipidemia 03/26/2015   Esophageal stenosis    Gait disturbance 10/14/2017   GERD (gastroesophageal reflux disease) 03/22/2012   Hemorrhoids    History of degenerative disc disease    HTN (hypertension) 03/22/2012   Hypertrophy of prostate    MI, old 03/22/2012   NSVT (nonsustained ventricular tachycardia) (HCC) 04/05/2020   Osteoarthrosis    Paroxysmal atrial fibrillation (HCC) 03/26/2015   Overview:  Chads score 2, was anticoagulated  but developed tarry stool optic admission was withdrawn for now scheduled to see gastroenterologist   Persistent atrial fibrillation (HCC) 01/20/2018   Pre-op evaluation 03/25/2016   S/P CABG x 3 03/25/2012   Tobacco abuse 03/22/2012   Overview:  Quit 2 years ago     Allergies Allergies  Allergen Reactions   Aciphex [Rabeprazole Sodium] Hives   Lipitor [Atorvastatin]    Omeprazole Hives   Pravachol  [Pravastatin ]      Medications No current outpatient medications on file.   Review of Systems Review of Systems  Constitutional:  Negative for chills and fever.  HENT:  Negative for congestion.   Respiratory:  Negative for shortness of breath.   Gastrointestinal:  Positive for abdominal pain. Negative for constipation, diarrhea, nausea and vomiting.  Genitourinary:  Positive for dysuria, flank pain and hematuria.  Musculoskeletal:  Positive for myalgias.  Neurological:  Negative for dizziness, weakness and headaches.       Objective:    Vitals BP 118/68   Pulse 90   Temp 98.6 F (37 C)   Resp 17   Ht 5' 11 (1.803 m)   Wt 148 lb 12.8 oz (67.5 kg)   SpO2 98%   BMI 20.75 kg/m    Physical Examination Physical Exam Constitutional:      Appearance: Normal appearance. He is not ill-appearing.  Cardiovascular:     Rate and Rhythm: Normal rate  and regular rhythm.     Pulses: Normal pulses.     Heart sounds: No murmur heard.    No friction rub. No gallop.  Pulmonary:     Effort: Pulmonary effort is normal. No respiratory distress.     Breath sounds: No wheezing, rhonchi or rales.  Abdominal:     General: Abdomen is flat. Bowel sounds are normal. There is no distension.     Palpations: Abdomen is soft.     Tenderness: There is no abdominal tenderness.  Musculoskeletal:     Right lower leg: No edema.     Left lower leg: No edema.  Skin:    General: Skin is warm and dry.     Findings: No rash.  Neurological:     Mental Status: He is alert.        Assessment & Plan:    Urinary tract infection with hematuria His UA was postiive for a UTI.  He had proteus miralabis on his last urine culture.  I am going to put him on augmentin  875mg  po BID x 10 days.  I am also going to refer him to urology for recurrent UTI's.  Myalgia He has had myalgias where we tested him for COVID-19 and flu testing which were negative.    No follow-ups on file.   Selinda Fleeta Finger, MD

## 2023-09-09 ENCOUNTER — Ambulatory Visit: Payer: Medicare HMO

## 2023-09-09 DIAGNOSIS — R319 Hematuria, unspecified: Secondary | ICD-10-CM | POA: Diagnosis not present

## 2023-09-09 DIAGNOSIS — R300 Dysuria: Secondary | ICD-10-CM

## 2023-09-09 DIAGNOSIS — N390 Urinary tract infection, site not specified: Secondary | ICD-10-CM | POA: Diagnosis not present

## 2023-09-09 NOTE — Progress Notes (Signed)
 Nurse visit

## 2023-09-15 LAB — URINE CULTURE

## 2023-10-07 ENCOUNTER — Ambulatory Visit: Admitting: Internal Medicine

## 2023-10-07 ENCOUNTER — Encounter: Payer: Self-pay | Admitting: Internal Medicine

## 2023-10-07 VITALS — BP 126/74 | HR 84 | Temp 98.7°F | Resp 18 | Ht 71.0 in | Wt 152.0 lb

## 2023-10-07 DIAGNOSIS — H1011 Acute atopic conjunctivitis, right eye: Secondary | ICD-10-CM | POA: Diagnosis not present

## 2023-10-07 MED ORDER — OLOPATADINE HCL 0.1 % OP SOLN: 1.0000 [drp] | Freq: Two times a day (BID) | OPHTHALMIC | 2 refills | Status: DC

## 2023-10-07 NOTE — Assessment & Plan Note (Signed)
 He does not have cellulitis of his eye lids.  His conjunctiva is not red.  I think he has allergic rhinitis.  If he develops redness of his conjunctiva, they are to call me.  I am going to try him on a trial of patanol eye drops.

## 2023-10-07 NOTE — Progress Notes (Signed)
 Office Visit  Subjective   Patient ID: Cameron Willis   DOB: 19-Mar-1934   Age: 89 y.o.   MRN: [id number]   Chief Complaint Chief Complaint  Patient presents with   Acute Visit    Right eye, itchy and burning     History of Present Illness Cameron Willis returns today for irritated right eye that started last week.  He is waking up with matted eyes.  The conjunctiva is not involved with redness.  His upper and lower eye lids are red and irritated.  He is complaining of some sneezing, watery nose and post nasal drip.  He denies any pain with movement of his eye.     Past Medical History Past Medical History:  Diagnosis Date   Anxiety and depression    Bradycardia by electrocardiogram 01/20/2018   Cervical disc disorder at C4-C5 level with myelopathy 12/05/2016   Chronic atrial fibrillation (HCC) 03/01/2018   Contracture of right knee 12/05/2016   COPD (chronic obstructive pulmonary disease) (HCC) 03/22/2012   Coronary artery disease involving native coronary artery of native heart without angina pectoris 03/22/2012   Overview:  Coronary artery bypass graft August 2013 LIMA to LAD and SVG to RCA and SVG to obtuse marginal   Dyslipidemia 03/26/2015   Esophageal stenosis    Gait disturbance 10/14/2017   GERD (gastroesophageal reflux disease) 03/22/2012   Hemorrhoids    History of degenerative disc disease    HTN (hypertension) 03/22/2012   Hypertrophy of prostate    MI, old 03/22/2012   NSVT (nonsustained ventricular tachycardia) (HCC) 04/05/2020   Osteoarthrosis    Paroxysmal atrial fibrillation (HCC) 03/26/2015   Overview:  Chads score 2, was anticoagulated but developed tarry stool optic admission was withdrawn for now scheduled to see gastroenterologist   Persistent atrial fibrillation (HCC) 01/20/2018   Pre-op evaluation 03/25/2016   S/P CABG x 3 03/25/2012   Tobacco abuse 03/22/2012   Overview:  Quit 2 years ago     Allergies Allergies  Allergen Reactions   Aciphex [Rabeprazole Sodium]  Hives   Lipitor [Atorvastatin]    Omeprazole Hives   Pravachol [Pravastatin]      Medications No current outpatient medications on file.   Review of Systems Review of Systems  Constitutional:  Negative for chills and fever.  HENT:  Negative for congestion and sore throat.   Eyes:  Negative for blurred vision and double vision.  Respiratory:  Negative for shortness of breath and wheezing.   Skin:  Negative for itching and rash.  Neurological:  Negative for dizziness, weakness and headaches.       Objective:    Vitals BP 126/74   Pulse 84   Temp 98.7 F (37.1 C)   Resp 18   Ht 5\' 11"  (1.803 m)   Wt 152 lb (68.9 kg)   SpO2 97%   BMI 21.20 kg/m    Physical Examination Physical Exam Constitutional:      Appearance: Normal appearance. He is not ill-appearing.  HENT:     Right Ear: Tympanic membrane, ear canal and external ear normal.     Left Ear: Tympanic membrane, ear canal and external ear normal.     Nose: Congestion and rhinorrhea present.     Mouth/Throat:     Mouth: Mucous membranes are moist.     Pharynx: Oropharynx is clear. No oropharyngeal exudate or posterior oropharyngeal erythema.  Eyes:     General: No scleral icterus.    Conjunctiva/sclera: Conjunctivae normal.  Pupils: Pupils are equal, round, and reactive to light.     Comments: He has some watering of his right eye and slight redness of his upper and lower eye lids but no pain upon movement of his eyes.  Cardiovascular:     Rate and Rhythm: Normal rate and regular rhythm.     Pulses: Normal pulses.     Heart sounds: No murmur heard.    No friction rub. No gallop.  Pulmonary:     Effort: Pulmonary effort is normal. No respiratory distress.     Breath sounds: No wheezing, rhonchi or rales.  Abdominal:     General: Abdomen is flat. Bowel sounds are normal. There is no distension.     Palpations: Abdomen is soft.     Tenderness: There is no abdominal tenderness.  Musculoskeletal:     Right  lower leg: No edema.     Left lower leg: No edema.  Skin:    General: Skin is warm and dry.     Findings: No rash.  Neurological:     Mental Status: He is alert.        Assessment & Plan:   Allergic conjunctivitis of right eye He does not have cellulitis of his eye lids.  His conjunctiva is not red.  I think he has allergic rhinitis.  If he develops redness of his conjunctiva, they are to call me.  I am going to try him on a trial of patanol eye drops.    No follow-ups on file.   Crist Fat, MD

## 2023-10-28 ENCOUNTER — Ambulatory Visit: Payer: Medicare HMO

## 2023-10-28 DIAGNOSIS — I442 Atrioventricular block, complete: Secondary | ICD-10-CM | POA: Diagnosis not present

## 2023-11-02 LAB — CUP PACEART REMOTE DEVICE CHECK
Battery Remaining Longevity: 126 mo
Battery Voltage: 3.02 V
Brady Statistic RV Percent Paced: 97.26 %
Date Time Interrogation Session: 20250328134753
Implantable Lead Connection Status: 753985
Implantable Lead Implant Date: 20211227
Implantable Lead Location: 753860
Implantable Lead Model: 3830
Implantable Pulse Generator Implant Date: 20211227
Lead Channel Impedance Value: 399 Ohm
Lead Channel Impedance Value: 513 Ohm
Lead Channel Pacing Threshold Amplitude: 0.625 V
Lead Channel Pacing Threshold Pulse Width: 0.4 ms
Lead Channel Sensing Intrinsic Amplitude: 19.375 mV
Lead Channel Sensing Intrinsic Amplitude: 19.375 mV
Lead Channel Setting Pacing Amplitude: 2 V
Lead Channel Setting Pacing Pulse Width: 0.4 ms
Lead Channel Setting Sensing Sensitivity: 0.9 mV
Zone Setting Status: 755011

## 2023-11-06 ENCOUNTER — Encounter: Payer: Self-pay | Admitting: Internal Medicine

## 2023-11-23 ENCOUNTER — Encounter: Payer: Self-pay | Admitting: Cardiology

## 2023-11-23 ENCOUNTER — Ambulatory Visit: Payer: Medicare HMO | Attending: Cardiology | Admitting: Cardiology

## 2023-11-23 VITALS — BP 130/70 | HR 73 | Ht 71.0 in

## 2023-11-23 DIAGNOSIS — I4729 Other ventricular tachycardia: Secondary | ICD-10-CM | POA: Diagnosis not present

## 2023-11-23 DIAGNOSIS — D6869 Other thrombophilia: Secondary | ICD-10-CM | POA: Diagnosis not present

## 2023-11-23 DIAGNOSIS — I442 Atrioventricular block, complete: Secondary | ICD-10-CM

## 2023-11-23 DIAGNOSIS — I10 Essential (primary) hypertension: Secondary | ICD-10-CM

## 2023-11-23 DIAGNOSIS — I2510 Atherosclerotic heart disease of native coronary artery without angina pectoris: Secondary | ICD-10-CM

## 2023-11-23 DIAGNOSIS — I4821 Permanent atrial fibrillation: Secondary | ICD-10-CM

## 2023-11-23 LAB — CUP PACEART INCLINIC DEVICE CHECK
Date Time Interrogation Session: 20250421170154
Implantable Lead Connection Status: 753985
Implantable Lead Implant Date: 20211227
Implantable Lead Location: 753860
Implantable Lead Model: 3830
Implantable Pulse Generator Implant Date: 20211227

## 2023-11-23 NOTE — Progress Notes (Signed)
  Electrophysiology Office Note:   Date:  11/23/2023  ID:  Cameron Willis, DOB 12/16/1933, MRN [id number]  Primary Cardiologist: Jerryl Morin, DO Primary Heart Failure: None Electrophysiologist: Parsa Rickett [name], MD      History of Present Illness:   Cameron Willis is a 89 y.o. male with h/o atrial fibrillation, COPD, coronary artery disease post CABG, hyperlipidemia, hypertension, tobacco abuse, complete heart block seen today for routine electrophysiology followup.   Since last being seen in our clinic the patient reports doing well.  He has no chest pain or shortness of breath.  He is able to do all of his daily activities without restriction.  He is in a wheelchair and has been for quite some time.  He has no acute complaints.  he denies chest pain, palpitations, dyspnea, PND, orthopnea, nausea, vomiting, dizziness, syncope, edema, weight gain, or early satiety.   Review of systems complete and found to be negative unless listed in HPI.      EP Information / Studies Reviewed:    EKG is ordered today. Personal review as below.  EKG Interpretation Date/Time:  Monday November 23 2023 15:28:44 EDT Ventricular Rate:  73 PR Interval:    QRS Duration:  98 QT Interval:  430 QTC Calculation: 473 R Axis:   -48  Text Interpretation: Atrial fibrillation Ventricular-paced rhythm Abnormal ECG When compared with ECG of 31-Jul-2020 03:00, No significant change since last tracing Confirmed by Del Overfelt ([id number]) on 11/23/2023 3:43:05 PM   PPM Interrogation-  reviewed in detail today,  See PACEART report.  Device History: Medtronic Dual Chamber PPM implanted 07/30/2020 for CHB  Risk Assessment/Calculations:    CHA2DS2-VASc Score = 4   This indicates a 4.8% annual risk of stroke. The patient's score is based upon: CHF History: 0 HTN History: 1 Diabetes History: 0 Stroke History: 0 Vascular Disease History: 1 Age Score: 2 Gender Score: 0            Physical Exam:   VS:  BP 130/70    Pulse 73   Ht 5\' 11"  (1.803 m)   SpO2 97%   BMI 21.20 kg/m    Wt Readings from Last 3 Encounters:  10/07/23 152 lb (68.9 kg)  09/08/23 148 lb 12.8 oz (67.5 kg)  08/10/23 145 lb (65.8 kg)     GEN: Well nourished, well developed in no acute distress NECK: No JVD; No carotid bruits CARDIAC: Regular rate and rhythm, no murmurs, rubs, gallops RESPIRATORY:  Clear to auscultation without rales, wheezing or rhonchi  ABDOMEN: Soft, non-tender, non-distended EXTREMITIES:  No edema; No deformity   ASSESSMENT AND PLAN:    CHB s/p Medtronic PPM  Normal PPM function See Pace Art report Sensing, threshold, impedance within normal limits Device programming reviewed and appropriate patient No changes today  2.  Permanent atrial fibrillation: Rate controlled.  3.  Secondary hypercoagulable state: On Eliquis  for atrial fibrillation  4.  Coronary artery disease: Post CABG.  No current chest pain  5.  Hypertension: well controlled  6.  Nonsustained VT: Minimal episodes noted on device interrogation  Disposition:   Follow up with Dr. Lawana Pray in 12 months  Signed, Furqan Gosselin [name], MD

## 2023-12-10 NOTE — Progress Notes (Signed)
 Remote pacemaker transmission.

## 2023-12-10 NOTE — Addendum Note (Signed)
 Addended by: Lott Rouleau A on: 12/10/2023 12:21 PM   Modules accepted: Orders

## 2024-01-07 DIAGNOSIS — Z125 Encounter for screening for malignant neoplasm of prostate: Secondary | ICD-10-CM | POA: Diagnosis not present

## 2024-01-07 DIAGNOSIS — J449 Chronic obstructive pulmonary disease, unspecified: Secondary | ICD-10-CM | POA: Diagnosis not present

## 2024-01-07 DIAGNOSIS — N401 Enlarged prostate with lower urinary tract symptoms: Secondary | ICD-10-CM | POA: Diagnosis not present

## 2024-01-07 DIAGNOSIS — R351 Nocturia: Secondary | ICD-10-CM | POA: Diagnosis not present

## 2024-01-11 DIAGNOSIS — N390 Urinary tract infection, site not specified: Secondary | ICD-10-CM | POA: Diagnosis not present

## 2024-01-27 ENCOUNTER — Ambulatory Visit (INDEPENDENT_AMBULATORY_CARE_PROVIDER_SITE_OTHER): Payer: Medicare HMO

## 2024-01-27 DIAGNOSIS — I495 Sick sinus syndrome: Secondary | ICD-10-CM | POA: Diagnosis not present

## 2024-01-27 LAB — CUP PACEART REMOTE DEVICE CHECK
Battery Remaining Longevity: 124 mo
Battery Voltage: 3.02 V
Brady Statistic RV Percent Paced: 98.25 %
Date Time Interrogation Session: 20250625022903
Implantable Lead Connection Status: 753985
Implantable Lead Implant Date: 20211227
Implantable Lead Location: 753860
Implantable Lead Model: 3830
Implantable Pulse Generator Implant Date: 20211227
Lead Channel Impedance Value: 418 Ohm
Lead Channel Impedance Value: 532 Ohm
Lead Channel Pacing Threshold Amplitude: 0.625 V
Lead Channel Pacing Threshold Pulse Width: 0.4 ms
Lead Channel Sensing Intrinsic Amplitude: 16.625 mV
Lead Channel Sensing Intrinsic Amplitude: 16.625 mV
Lead Channel Setting Pacing Amplitude: 2 V
Lead Channel Setting Pacing Pulse Width: 0.4 ms
Lead Channel Setting Sensing Sensitivity: 0.9 mV
Zone Setting Status: 755011

## 2024-01-31 ENCOUNTER — Ambulatory Visit: Payer: Self-pay | Admitting: Cardiology

## 2024-02-23 DIAGNOSIS — R351 Nocturia: Secondary | ICD-10-CM | POA: Diagnosis not present

## 2024-02-23 DIAGNOSIS — N401 Enlarged prostate with lower urinary tract symptoms: Secondary | ICD-10-CM | POA: Diagnosis not present

## 2024-04-20 NOTE — Progress Notes (Signed)
 Remote pacemaker transmission.

## 2024-04-27 ENCOUNTER — Ambulatory Visit: Payer: Medicare HMO

## 2024-04-27 DIAGNOSIS — I495 Sick sinus syndrome: Secondary | ICD-10-CM

## 2024-04-28 LAB — CUP PACEART REMOTE DEVICE CHECK
Battery Remaining Longevity: 121 mo
Battery Voltage: 3.02 V
Brady Statistic RV Percent Paced: 98.62 %
Date Time Interrogation Session: 20250924012227
Implantable Lead Connection Status: 753985
Implantable Lead Implant Date: 20211227
Implantable Lead Location: 753860
Implantable Lead Model: 3830
Implantable Pulse Generator Implant Date: 20211227
Lead Channel Impedance Value: 437 Ohm
Lead Channel Impedance Value: 532 Ohm
Lead Channel Pacing Threshold Amplitude: 0.75 V
Lead Channel Pacing Threshold Pulse Width: 0.4 ms
Lead Channel Sensing Intrinsic Amplitude: 23.75 mV
Lead Channel Sensing Intrinsic Amplitude: 23.75 mV
Lead Channel Setting Pacing Amplitude: 2 V
Lead Channel Setting Pacing Pulse Width: 0.4 ms
Lead Channel Setting Sensing Sensitivity: 0.9 mV
Zone Setting Status: 755011

## 2024-04-29 ENCOUNTER — Ambulatory Visit: Payer: Self-pay | Admitting: Cardiology

## 2024-04-29 NOTE — Progress Notes (Signed)
 Remote PPM Transmission

## 2024-05-24 DIAGNOSIS — R351 Nocturia: Secondary | ICD-10-CM | POA: Diagnosis not present

## 2024-05-24 DIAGNOSIS — N390 Urinary tract infection, site not specified: Secondary | ICD-10-CM | POA: Diagnosis not present

## 2024-05-24 DIAGNOSIS — N401 Enlarged prostate with lower urinary tract symptoms: Secondary | ICD-10-CM | POA: Diagnosis not present

## 2024-05-25 ENCOUNTER — Ambulatory Visit: Admitting: Internal Medicine

## 2024-05-25 VITALS — BP 134/80 | HR 68 | Temp 97.3°F | Resp 16 | Ht 66.5 in | Wt 149.0 lb

## 2024-05-25 DIAGNOSIS — R42 Dizziness and giddiness: Secondary | ICD-10-CM | POA: Insufficient documentation

## 2024-05-25 MED ORDER — ONDANSETRON HCL 4 MG PO TABS: 4.0000 mg | ORAL_TABLET | Freq: Three times a day (TID) | ORAL | 1 refills | Status: DC | PRN

## 2024-05-25 MED ORDER — MIRTAZAPINE 7.5 MG PO TABS: 7.5000 mg | ORAL_TABLET | Freq: Every day | ORAL | 2 refills | Status: DC

## 2024-05-25 MED ORDER — MECLIZINE HCL 12.5 MG PO TABS: 12.5000 mg | ORAL_TABLET | Freq: Two times a day (BID) | ORAL | 2 refills | Status: DC | PRN

## 2024-05-25 NOTE — Assessment & Plan Note (Signed)
 He drinks 2-3 cups of coffee per day and that is his only fluid intake.  I want him to drink more water.  We will start him on some zofran  and meclizine  prn.

## 2024-05-25 NOTE — Progress Notes (Signed)
 Office Visit  Subjective   Patient ID: Cameron Willis   DOB: 16-Oct-1933   Age: 89 y.o.   MRN: [id number]   Chief Complaint Chief Complaint  Patient presents with   Medication Problem    Pt in today to restart medications.      History of Present Illness Cameron Willis is a 89 yo male who comes in today to discuss his medications.  He saw the urologist yesterday and she wanted him to come back and see me as he has had problems with nausea and dzziness.  He was on regular medications which he stopped back in 10/2021 where he was on amlodipine  for BP, pravastatin  80mg  at bedtime, meclizine  12.5mg  q 12 hrs prn, protonix  and zofran  4mg  every 8 hours prn.  He states he is not drinking fluids well.  He also states he is not sleeping well and is not having any appetite.  The patient denies any depression.          Past Medical History Past Medical History:  Diagnosis Date   Anxiety and depression    Bradycardia by electrocardiogram 01/20/2018   Cervical disc disorder at C4-C5 level with myelopathy 12/05/2016   Chronic atrial fibrillation (HCC) 03/01/2018   Contracture of right knee 12/05/2016   COPD (chronic obstructive pulmonary disease) (HCC) 03/22/2012   Coronary artery disease involving native coronary artery of native heart without angina pectoris 03/22/2012   Overview:  Coronary artery bypass graft August 2013 LIMA to LAD and SVG to RCA and SVG to obtuse marginal   Dyslipidemia 03/26/2015   Esophageal stenosis    Gait disturbance 10/14/2017   GERD (gastroesophageal reflux disease) 03/22/2012   Hemorrhoids    History of degenerative disc disease    HTN (hypertension) 03/22/2012   Hypertrophy of prostate    MI, old 03/22/2012   NSVT (nonsustained ventricular tachycardia) (HCC) 04/05/2020   Osteoarthrosis    Paroxysmal atrial fibrillation (HCC) 03/26/2015   Overview:  Chads score 2, was anticoagulated but developed tarry stool optic admission was withdrawn for now scheduled to see gastroenterologist    Persistent atrial fibrillation (HCC) 01/20/2018   Pre-op evaluation 03/25/2016   S/P CABG x 3 03/25/2012   Tobacco abuse 03/22/2012   Overview:  Quit 2 years ago     Allergies Allergies  Allergen Reactions   Aciphex [Rabeprazole Sodium] Hives   Lipitor [Atorvastatin]    Omeprazole Hives   Pravachol  [Pravastatin ]      Medications No current outpatient medications on file.   Review of Systems Review of Systems  Constitutional:  Negative for chills and fever.  Eyes:  Negative for blurred vision.  Respiratory:  Negative for cough and shortness of breath.   Cardiovascular:  Negative for chest pain, palpitations and leg swelling.  Gastrointestinal:  Positive for nausea. Negative for abdominal pain, constipation, diarrhea and vomiting.  Genitourinary:  Negative for dysuria, frequency and hematuria.  Neurological:  Positive for dizziness. Negative for weakness and headaches.       Objective:    Vitals BP 134/80   Pulse 68   Temp (!) 97.3 F (36.3 C) (Temporal)   Resp 16   Ht 5' 6.5 (1.689 m)   Wt 149 lb (67.6 kg)   SpO2 96%   BMI 23.69 kg/m    Physical Examination Physical Exam Constitutional:      Appearance: Normal appearance. He is not ill-appearing.  Cardiovascular:     Rate and Rhythm: Normal rate and regular rhythm.  Pulses: Normal pulses.     Heart sounds: No murmur heard.    No friction rub. No gallop.  Pulmonary:     Effort: Pulmonary effort is normal. No respiratory distress.     Breath sounds: No wheezing, rhonchi or rales.  Abdominal:     General: Abdomen is flat. Bowel sounds are normal. There is no distension.     Palpations: Abdomen is soft.     Tenderness: There is no abdominal tenderness.  Musculoskeletal:     Right lower leg: No edema.     Left lower leg: No edema.  Skin:    General: Skin is warm and dry.     Findings: No rash.  Neurological:     Mental Status: He is alert.        Assessment & Plan:   Dizziness He drinks 2-3 cups  of coffee per day and that is his only fluid intake.  I want him to drink more water.  We will start him on some zofran  and meclizine  prn.    No follow-ups on file.   Selinda Fleeta Finger, MD

## 2024-06-29 DIAGNOSIS — N390 Urinary tract infection, site not specified: Secondary | ICD-10-CM | POA: Diagnosis not present

## 2024-06-29 DIAGNOSIS — E860 Dehydration: Secondary | ICD-10-CM | POA: Diagnosis not present

## 2024-06-29 DIAGNOSIS — N1832 Chronic kidney disease, stage 3b: Secondary | ICD-10-CM | POA: Diagnosis not present

## 2024-06-29 DIAGNOSIS — K219 Gastro-esophageal reflux disease without esophagitis: Secondary | ICD-10-CM | POA: Diagnosis not present

## 2024-06-29 DIAGNOSIS — R410 Disorientation, unspecified: Secondary | ICD-10-CM | POA: Diagnosis not present

## 2024-06-29 DIAGNOSIS — J449 Chronic obstructive pulmonary disease, unspecified: Secondary | ICD-10-CM | POA: Diagnosis not present

## 2024-06-29 DIAGNOSIS — I2510 Atherosclerotic heart disease of native coronary artery without angina pectoris: Secondary | ICD-10-CM | POA: Diagnosis not present

## 2024-06-29 DIAGNOSIS — Z792 Long term (current) use of antibiotics: Secondary | ICD-10-CM | POA: Diagnosis not present

## 2024-06-29 DIAGNOSIS — Z950 Presence of cardiac pacemaker: Secondary | ICD-10-CM | POA: Diagnosis not present

## 2024-06-29 DIAGNOSIS — Z79899 Other long term (current) drug therapy: Secondary | ICD-10-CM | POA: Diagnosis not present

## 2024-06-29 DIAGNOSIS — I10 Essential (primary) hypertension: Secondary | ICD-10-CM | POA: Diagnosis not present

## 2024-06-29 DIAGNOSIS — R4182 Altered mental status, unspecified: Secondary | ICD-10-CM | POA: Diagnosis not present

## 2024-06-30 DIAGNOSIS — K219 Gastro-esophageal reflux disease without esophagitis: Secondary | ICD-10-CM | POA: Diagnosis not present

## 2024-06-30 DIAGNOSIS — R410 Disorientation, unspecified: Secondary | ICD-10-CM | POA: Diagnosis not present

## 2024-06-30 DIAGNOSIS — I25810 Atherosclerosis of coronary artery bypass graft(s) without angina pectoris: Secondary | ICD-10-CM | POA: Diagnosis not present

## 2024-07-01 DIAGNOSIS — N390 Urinary tract infection, site not specified: Secondary | ICD-10-CM | POA: Diagnosis not present

## 2024-07-01 DIAGNOSIS — R410 Disorientation, unspecified: Secondary | ICD-10-CM | POA: Diagnosis not present

## 2024-07-01 DIAGNOSIS — I25810 Atherosclerosis of coronary artery bypass graft(s) without angina pectoris: Secondary | ICD-10-CM | POA: Diagnosis not present

## 2024-07-07 ENCOUNTER — Encounter: Payer: Self-pay | Admitting: Internal Medicine

## 2024-07-07 ENCOUNTER — Ambulatory Visit: Admitting: Internal Medicine

## 2024-07-07 VITALS — BP 130/78 | HR 52 | Temp 97.1°F | Resp 18 | Ht 66.5 in | Wt 125.0 lb

## 2024-07-07 DIAGNOSIS — M79605 Pain in left leg: Secondary | ICD-10-CM

## 2024-07-07 DIAGNOSIS — R262 Difficulty in walking, not elsewhere classified: Secondary | ICD-10-CM | POA: Diagnosis not present

## 2024-07-07 DIAGNOSIS — I25728 Atherosclerosis of autologous artery coronary artery bypass graft(s) with other forms of angina pectoris: Secondary | ICD-10-CM

## 2024-07-07 DIAGNOSIS — G8929 Other chronic pain: Secondary | ICD-10-CM | POA: Diagnosis not present

## 2024-07-07 DIAGNOSIS — R319 Hematuria, unspecified: Secondary | ICD-10-CM

## 2024-07-07 DIAGNOSIS — M79604 Pain in right leg: Secondary | ICD-10-CM | POA: Diagnosis not present

## 2024-07-07 DIAGNOSIS — I495 Sick sinus syndrome: Secondary | ICD-10-CM | POA: Diagnosis not present

## 2024-07-07 DIAGNOSIS — N390 Urinary tract infection, site not specified: Secondary | ICD-10-CM

## 2024-07-07 LAB — POCT URINALYSIS DIPSTICK
Blood, UA: POSITIVE
Glucose, UA: NEGATIVE
Ketones, UA: NEGATIVE
Nitrite, UA: NEGATIVE
Protein, UA: POSITIVE — AB
Spec Grav, UA: 1.025 (ref 1.010–1.025)
Urobilinogen, UA: 0.2 U/dL
pH, UA: 5.5 (ref 5.0–8.0)

## 2024-07-07 MED ORDER — BUPRENORPHINE HCL 75 MCG BU FILM: 75.0000 ug | ORAL_FILM | Freq: Two times a day (BID) | BUCCAL | 0 refills | Status: DC

## 2024-07-07 NOTE — Addendum Note (Signed)
 Addended byBETHA CALEEN DIRKS on: 07/07/2024 11:52 AM   Modules accepted: Orders

## 2024-07-07 NOTE — Progress Notes (Addendum)
 Office Visit  Subjective   Patient ID: Cameron Willis   DOB: Apr 17, 1934   Age: 89 y.o.   MRN: [id number]   Chief Complaint Chief Complaint  Patient presents with   Follow-up    Hospital follow up     History of Present Illness 62 years ld male is here for follow up from [facility] discharge.  He has recurrent urinary tract infection.  He was admitted to [facility] on November 26 with altered mental status and treated for urinary tract infection and discharged home on July 01, 2024.  He was given IV Rocephin but urine culture grew normal skin flora.  He was recently treated with urinary tract infection.  He was given IV fluid.  He was discharged home on cefdinir that he has completed.  He was advised to follow-up with the urologist.  He was told by urologist to have frequent urination to avoid having urinary retention and avoid infection.  But he is a bed-bound or wheelchair-bound% who can not urinate frequently and has incontinence as well.  I have discussed for the family to discuss with the urologist about prophylactic antibiotic.    He has hypertension and coronary artery disease, sick sinus syndrome status post pacemaker  This remained stable.  He is DNR.    He is complaining of severe pain in his legs from fall.  He fell while transferring from motorized wheelchair to bed.  He lives at home with his wife who really can not look after himself.  They have a sitter during daytime and 1 other family member lives with him at night.  Past Medical History Past Medical History:  Diagnosis Date   Anxiety and depression    Bradycardia by electrocardiogram 01/20/2018   Cervical disc disorder at C4-C5 level with myelopathy 12/05/2016   Chronic atrial fibrillation (HCC) 03/01/2018   Contracture of right knee 12/05/2016   COPD (chronic obstructive pulmonary disease) (HCC) 03/22/2012   Coronary artery disease involving native coronary artery of native heart without angina pectoris 03/22/2012    Overview:  Coronary artery bypass graft August 2013 LIMA to LAD and SVG to RCA and SVG to obtuse marginal   Dyslipidemia 03/26/2015   Esophageal stenosis    Gait disturbance 10/14/2017   GERD (gastroesophageal reflux disease) 03/22/2012   Hemorrhoids    History of degenerative disc disease    HTN (hypertension) 03/22/2012   Hypertrophy of prostate    MI, old 03/22/2012   NSVT (nonsustained ventricular tachycardia) (HCC) 04/05/2020   Osteoarthrosis    Paroxysmal atrial fibrillation (HCC) 03/26/2015   Overview:  Chads score 2, was anticoagulated but developed tarry stool optic admission was withdrawn for now scheduled to see gastroenterologist   Persistent atrial fibrillation (HCC) 01/20/2018   Pre-op evaluation 03/25/2016   S/P CABG x 3 03/25/2012   Tobacco abuse 03/22/2012   Overview:  Quit 2 years ago     Allergies Allergies  Allergen Reactions   Aciphex [Rabeprazole Sodium] Hives   Lipitor [Atorvastatin]    Omeprazole Hives   Pravachol  [Pravastatin ]      Review of Systems Review of Systems  Constitutional: Negative.   Respiratory: Negative.    Cardiovascular: Negative.   Gastrointestinal: Negative.   Musculoskeletal:          Leg / knee pain from fall       Objective:    Vitals BP 130/78   Pulse 73   Temp (!) 97.1 F (36.2 C)   Resp 18   Ht  5' 6.5 (1.689 m)   BMI 23.69 kg/m    Physical Examination Physical Exam Constitutional:      Appearance: Normal appearance.  Cardiovascular:     Rate and Rhythm: Normal rate and regular rhythm.     Heart sounds: Normal heart sounds.  Pulmonary:     Effort: Pulmonary effort is normal.     Breath sounds: Normal breath sounds.  Abdominal:     General: Bowel sounds are normal.     Palpations: Abdomen is soft.  Neurological:     Mental Status: He is alert. Mental status is at baseline.        Assessment & Plan:   SSS (sick sinus syndrome) (HCC)  He has a pacemaker in place.  Coronary artery disease of autologous  bypass graft with stable angina pectoris   Stable  Urinary tract infection with hematuria  His urine analysis has moderate blood and trace of leukocyte esterase. I will send urine for culture.  He will follow-up with urologist.  Recurrent UTI   He will discuss with the urologist about possibility of prophylactic antibiotic.  Chronic pain of both lower extremities   He is nonambulatory with chronic leg pain that got worse after fall.  I will start him on Belbuca  75 mcg twice a day.  Ambulatory dysfunction   Family has requested him for assisted living or skilled nursing placement but currently he lives at home and has a comptroller during daytime.  He has difficulty transferring from motorized wheelchair to bed.     This is a transition of care visit.  I have reviewed medication with him.  He will follow-up with urologist.  Return in about 1 month (around 08/07/2024).   Roetta Dare, MD

## 2024-07-09 LAB — URINE CULTURE

## 2024-07-09 NOTE — Assessment & Plan Note (Signed)
 His urine analysis has moderate blood and trace of leukocyte esterase. I will send urine for culture.  He will follow-up with urologist.

## 2024-07-09 NOTE — Assessment & Plan Note (Signed)
 He will discuss with the urologist about possibility of prophylactic antibiotic.

## 2024-07-09 NOTE — Assessment & Plan Note (Signed)
 Family has requested him for assisted living or skilled nursing placement but currently he lives at home and has a comptroller during daytime.  He has difficulty transferring from motorized wheelchair to bed.

## 2024-07-09 NOTE — Assessment & Plan Note (Signed)
 Stable

## 2024-07-09 NOTE — Assessment & Plan Note (Signed)
 He has a pacemaker in place.

## 2024-07-09 NOTE — Assessment & Plan Note (Signed)
 He is nonambulatory with chronic leg pain that got worse after fall.  I will start him on Belbuca  75 mcg twice a day.

## 2024-07-18 ENCOUNTER — Other Ambulatory Visit: Payer: Self-pay | Admitting: Internal Medicine

## 2024-07-18 MED ORDER — TRAMADOL HCL 50 MG PO TABS: 50.0000 mg | ORAL_TABLET | Freq: Two times a day (BID) | ORAL | 1 refills | Status: DC | PRN

## 2024-07-19 ENCOUNTER — Ambulatory Visit: Admitting: Internal Medicine

## 2024-07-19 ENCOUNTER — Encounter: Payer: Self-pay | Admitting: Internal Medicine

## 2024-07-19 VITALS — BP 120/70 | HR 66 | Temp 97.4°F | Resp 18 | Ht 66.5 in | Wt 125.0 lb

## 2024-07-19 DIAGNOSIS — L8945 Pressure ulcer of contiguous site of back, buttock and hip, unstageable: Secondary | ICD-10-CM

## 2024-07-19 NOTE — Progress Notes (Signed)
 Office Visit  Subjective   Patient ID: Cameron Willis   DOB: 1934-06-21   Age: 89 y.o.   MRN: [id number]   Chief Complaint Chief Complaint  Patient presents with   Bed sores    Office visit     History of Present Illness 89 years old male who was brought by son as he developed pressure ulcer on his left lateral hip when he was in hospital.  He says that his sitter mentioned that the pressure ulcer area is getting black and there is some drainage coming from it.  Patient does not ambulate and need assistance in transfer from bed to motorized wheelchair.  Per son he is sleepy most of the time on left side where the ulcer is.  Per son he did not have this ulcer prior to admission to hospital.  Past Medical History Past Medical History:  Diagnosis Date   Anxiety and depression    Bradycardia by electrocardiogram 01/20/2018   Cervical disc disorder at C4-C5 level with myelopathy 12/05/2016   Chronic atrial fibrillation (HCC) 03/01/2018   Contracture of right knee 12/05/2016   COPD (chronic obstructive pulmonary disease) (HCC) 03/22/2012   Coronary artery disease involving native coronary artery of native heart without angina pectoris 03/22/2012   Overview:  Coronary artery bypass graft August 2013 LIMA to LAD and SVG to RCA and SVG to obtuse marginal   Dyslipidemia 03/26/2015   Esophageal stenosis    Gait disturbance 10/14/2017   GERD (gastroesophageal reflux disease) 03/22/2012   Hemorrhoids    History of degenerative disc disease    HTN (hypertension) 03/22/2012   Hypertrophy of prostate    MI, old 03/22/2012   NSVT (nonsustained ventricular tachycardia) (HCC) 04/05/2020   Osteoarthrosis    Paroxysmal atrial fibrillation (HCC) 03/26/2015   Overview:  Chads score 2, was anticoagulated but developed tarry stool optic admission was withdrawn for now scheduled to see gastroenterologist   Persistent atrial fibrillation (HCC) 01/20/2018   Pre-op evaluation 03/25/2016   S/P CABG x 3 03/25/2012    Tobacco abuse 03/22/2012   Overview:  Quit 2 years ago     Allergies Allergies[1]   Review of Systems Review of Systems  Cardiovascular: Negative.   Musculoskeletal:         General body pain  Skin:          Pressure ulcer on left lateral hip       Objective:    Vitals BP 120/70   Pulse 66   Temp (!) 97.4 F (36.3 C)   Resp 18   Ht 5' 6.5 (1.689 m)   Wt 125 lb (56.7 kg)   SpO2 96%   BMI 19.87 kg/m    Physical Examination Physical Exam Abdominal:     General: Bowel sounds are normal.     Palpations: Abdomen is soft.  Skin:    Comments:  He has unstageable pressure ulcer on his lateral left greater trochanteric area.  There is area of black color 1/2 of area.  Neurological:     Mental Status: He is alert.        Assessment & Plan:   Pressure injury of skin   He has unstageable pressure ulcer over left greater trochanter area.  I will  send home health nurse to come to do daily dressing with calcium alginate and 1/4 of Dakin solution.  I will also do x-ray of left hip to make sure bone is not involved..    Return in about  3 months (around 10/17/2024).   Rosebud Koenen, MD      [1]  Allergies Allergen Reactions   Aciphex [Rabeprazole Sodium] Hives   Lipitor [Atorvastatin]    Omeprazole Hives   Pravachol  [Pravastatin ]

## 2024-07-19 NOTE — Assessment & Plan Note (Signed)
 He has unstageable pressure ulcer over left greater trochanter area.  I will  send home health nurse to come to do daily dressing with calcium alginate and 1/4 of Dakin solution.  I will also do x-ray of left hip to make sure bone is not involved.SABRA

## 2024-08-04 DEATH — deceased

## 2024-10-04 ENCOUNTER — Ambulatory Visit: Admitting: Internal Medicine

## 2024-10-28 ENCOUNTER — Ambulatory Visit

## 2025-01-27 ENCOUNTER — Ambulatory Visit

## 2025-04-28 ENCOUNTER — Ambulatory Visit
